# Patient Record
Sex: Female | Born: 1985 | Race: White | Hispanic: No | Marital: Married | Smoking: Never smoker
Health system: Southern US, Community
[De-identification: ages and names within clinical notes are randomized; demographics above are authoritative.]

## PROBLEM LIST (undated history)

## (undated) DIAGNOSIS — J302 Other seasonal allergic rhinitis: Secondary | ICD-10-CM

## (undated) DIAGNOSIS — F329 Major depressive disorder, single episode, unspecified: Secondary | ICD-10-CM

## (undated) DIAGNOSIS — F32A Depression, unspecified: Secondary | ICD-10-CM

## (undated) DIAGNOSIS — J45909 Unspecified asthma, uncomplicated: Secondary | ICD-10-CM

## (undated) DIAGNOSIS — B019 Varicella without complication: Secondary | ICD-10-CM

## (undated) HISTORY — DX: Major depressive disorder, single episode, unspecified: F32.9

## (undated) HISTORY — DX: Varicella without complication: B01.9

## (undated) HISTORY — DX: Depression, unspecified: F32.A

## (undated) HISTORY — DX: Other seasonal allergic rhinitis: J30.2

---

## 2009-06-19 ENCOUNTER — Emergency Department (HOSPITAL_COMMUNITY): Admission: EM | Admit: 2009-06-19 | Discharge: 2009-06-19 | Payer: Self-pay | Admitting: Emergency Medicine

## 2009-06-19 IMAGING — US US ABDOMEN COMPLETE
1 series · 14 of 25 positions shown · non-contrast
Comparison: None

CLINICAL DATA: Abdominal pain with vomiting

ABDOMEN ULTRASOUND
TECHNIQUE: Routine

[Series 1: us abdomen complete · 0.26mm/px · 14 of 74 slices shown]
[im 1/74]
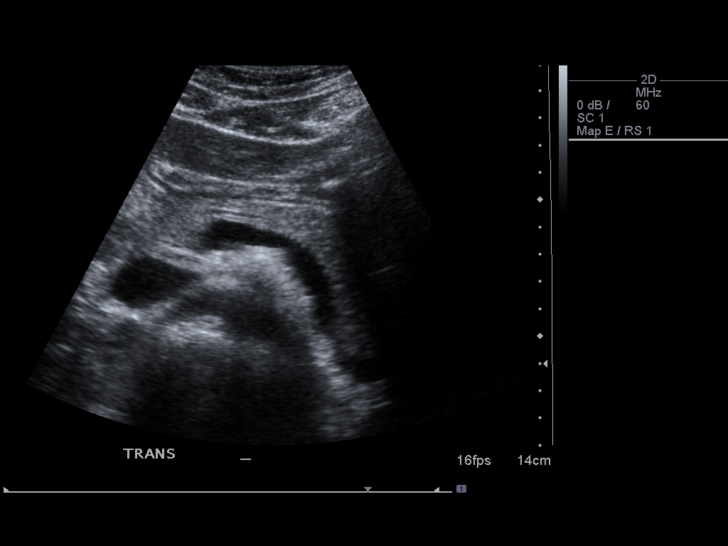
[im 7/74]
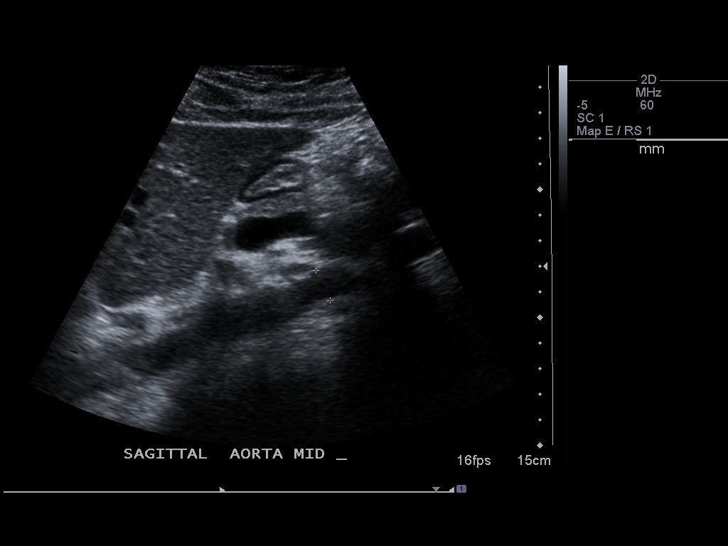
[im 13/74]
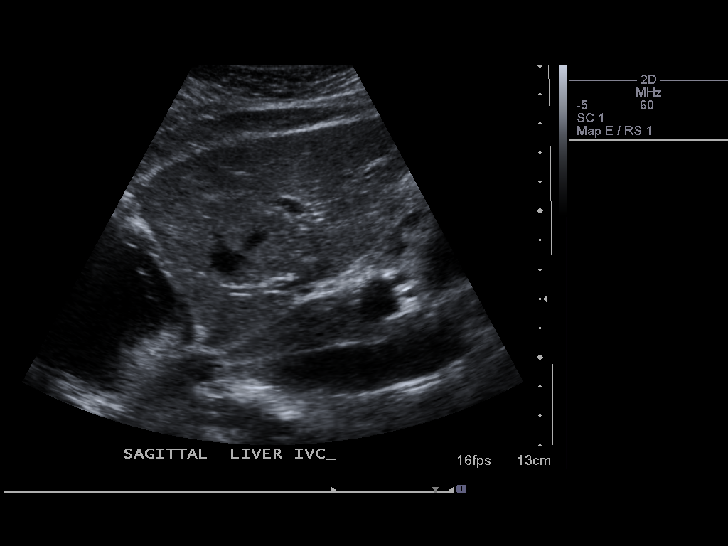
[im 19/74]
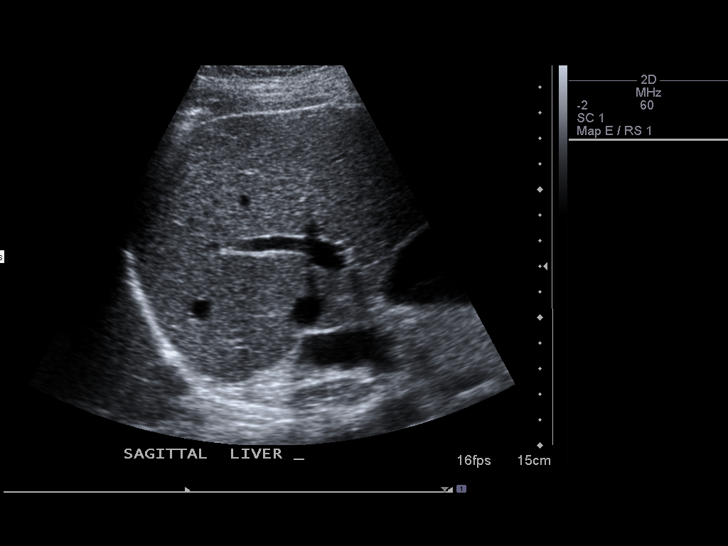
[im 25/74]
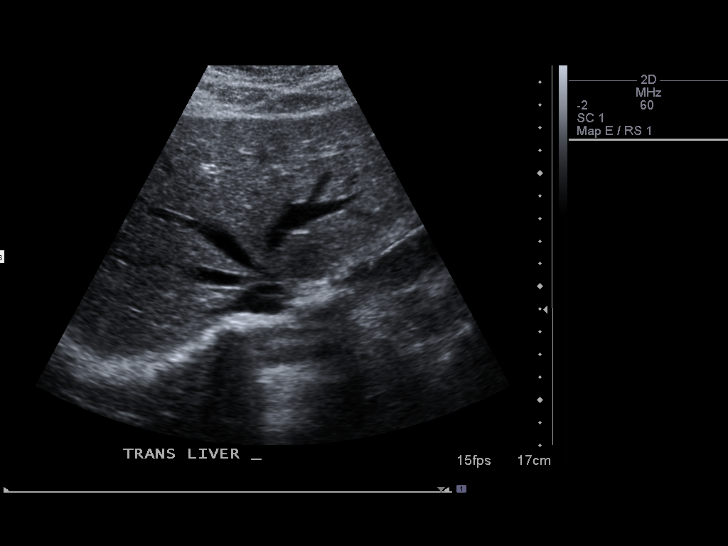
[im 28/74]
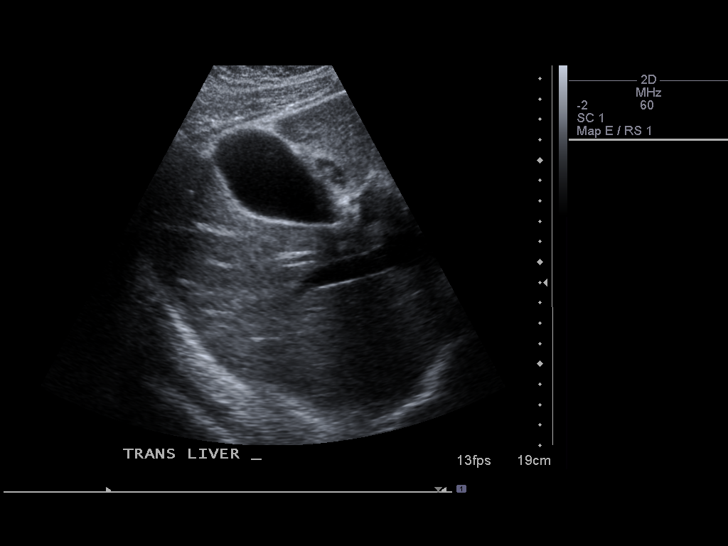
[im 34/74]
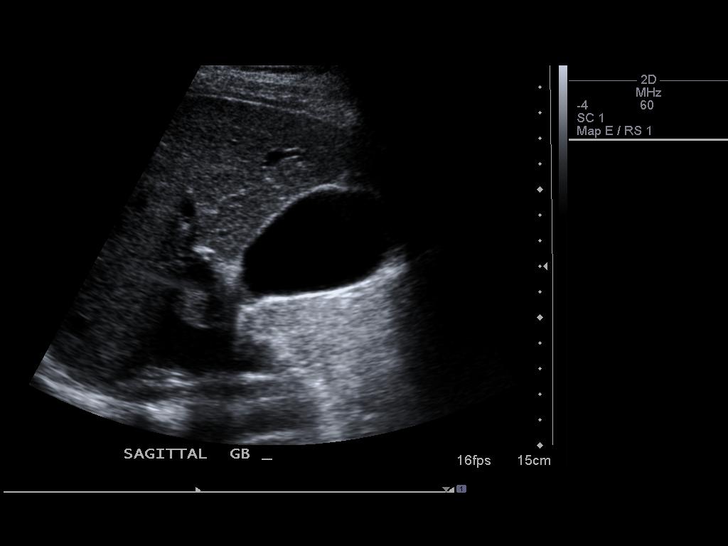
[im 40/74]
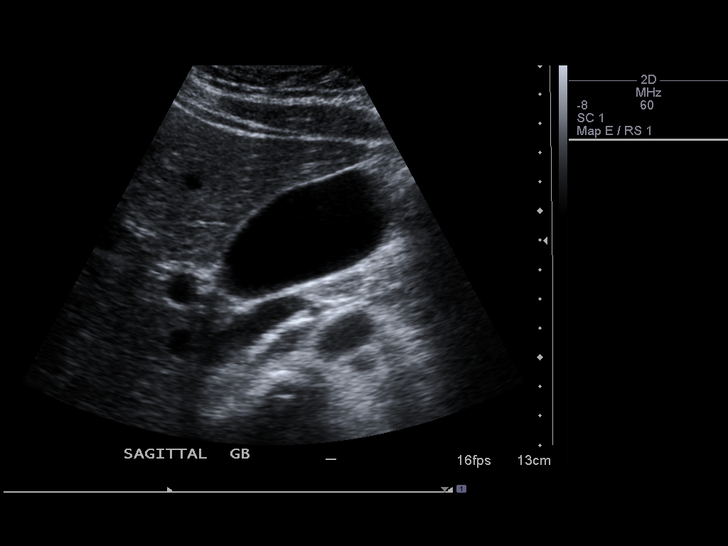
[im 46/74]
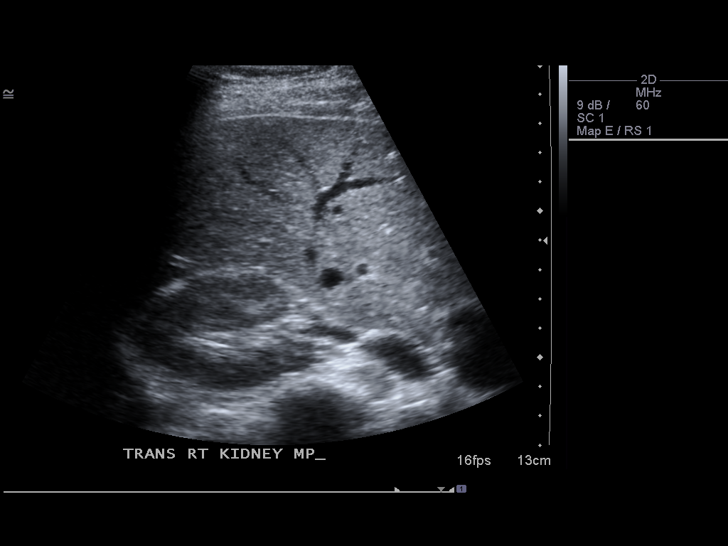
[im 49/74]
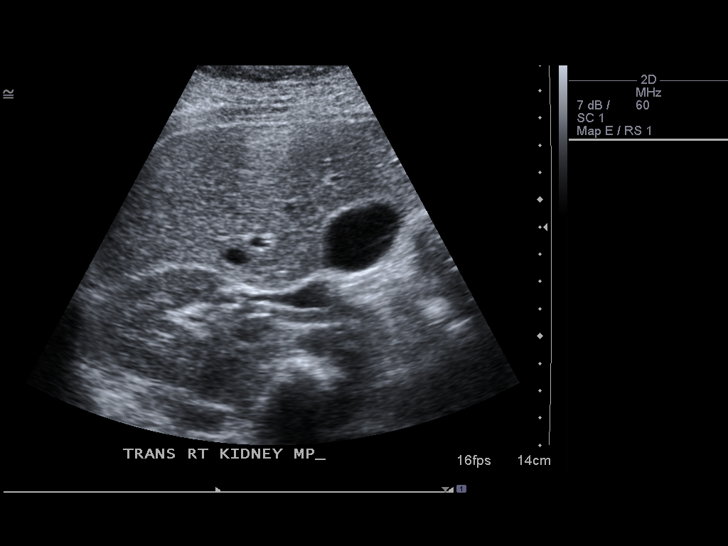
[im 55/74]
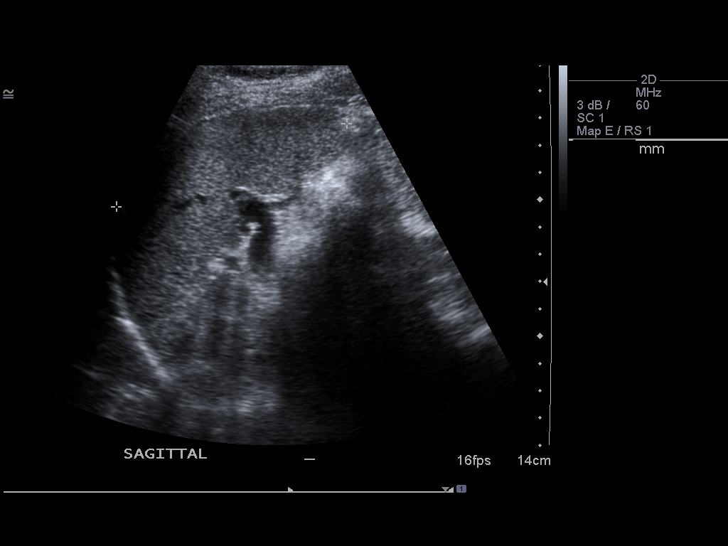
[im 61/74]
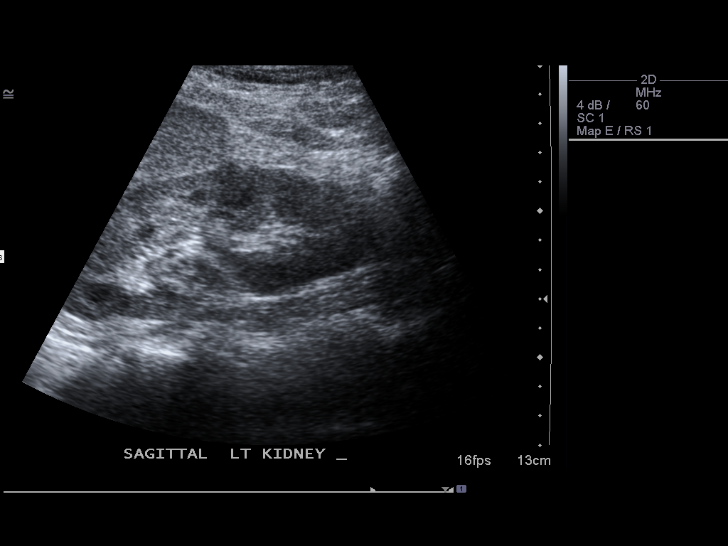
[im 67/74]
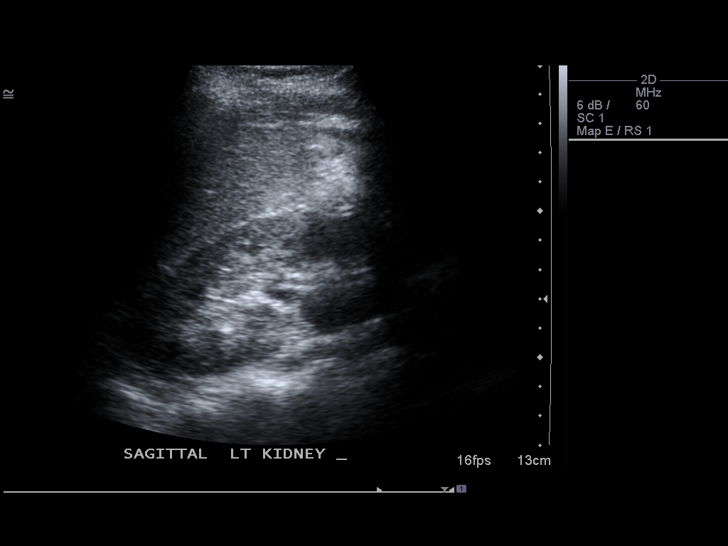
[im 74/74]
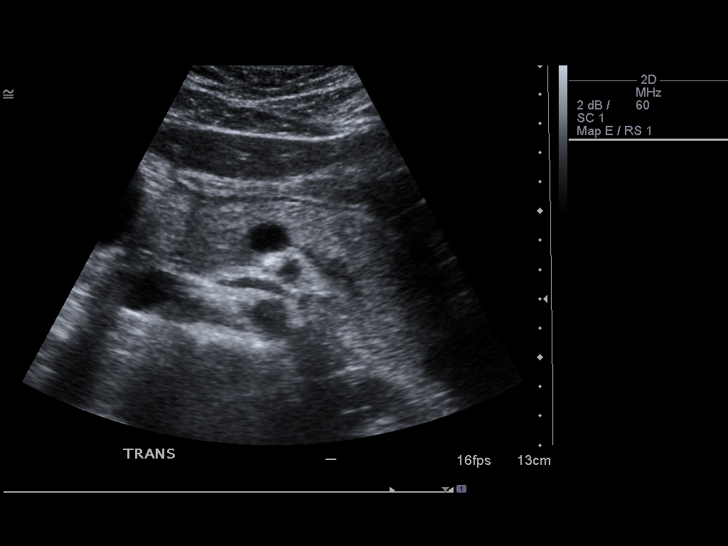

[14 of 25 positions shown; findings below may reference images not displayed]

FINDINGS: Gallbladder and bile ducts normal.  The common duct
measures 3.7 mm.

Liver, pancreas, spleen, and kidneys normal.  IVC and aorta normal.
No ascites.
IMPRESSION: No pathological findings.

## 2011-04-01 LAB — DIFFERENTIAL
Basophils Relative: 0 % (ref 0–1)
Eosinophils Absolute: 0.2 10*3/uL (ref 0.0–0.7)
Lymphs Abs: 1.1 10*3/uL (ref 0.7–4.0)
Monocytes Absolute: 0.3 10*3/uL (ref 0.1–1.0)
Monocytes Relative: 3 % (ref 3–12)
Neutro Abs: 8.5 10*3/uL — ABNORMAL HIGH (ref 1.7–7.7)

## 2011-04-01 LAB — URINALYSIS, ROUTINE W REFLEX MICROSCOPIC
Glucose, UA: NEGATIVE mg/dL
Hgb urine dipstick: NEGATIVE
Protein, ur: NEGATIVE mg/dL
pH: 7 (ref 5.0–8.0)

## 2011-04-01 LAB — COMPREHENSIVE METABOLIC PANEL
ALT: 22 U/L (ref 0–35)
Albumin: 3.8 g/dL (ref 3.5–5.2)
Alkaline Phosphatase: 71 U/L (ref 39–117)
Calcium: 9.2 mg/dL (ref 8.4–10.5)
Potassium: 3.4 mEq/L — ABNORMAL LOW (ref 3.5–5.1)
Sodium: 139 mEq/L (ref 135–145)
Total Protein: 7.4 g/dL (ref 6.0–8.3)

## 2011-04-01 LAB — CBC
MCHC: 33.8 g/dL (ref 30.0–36.0)
Platelets: 266 10*3/uL (ref 150–400)
RDW: 13.9 % (ref 11.5–15.5)

## 2011-04-01 LAB — POCT PREGNANCY, URINE: Preg Test, Ur: NEGATIVE

## 2012-12-07 ENCOUNTER — Ambulatory Visit: Payer: Self-pay | Admitting: Family Medicine

## 2012-12-11 ENCOUNTER — Encounter: Payer: Self-pay | Admitting: Family Medicine

## 2012-12-11 ENCOUNTER — Ambulatory Visit (INDEPENDENT_AMBULATORY_CARE_PROVIDER_SITE_OTHER): Payer: BC Managed Care – PPO | Admitting: Family Medicine

## 2012-12-11 VITALS — BP 112/78 | HR 111 | Temp 98.7°F | Ht 65.0 in | Wt 185.0 lb

## 2012-12-11 DIAGNOSIS — Z7189 Other specified counseling: Secondary | ICD-10-CM

## 2012-12-11 DIAGNOSIS — Z23 Encounter for immunization: Secondary | ICD-10-CM

## 2012-12-11 DIAGNOSIS — E669 Obesity, unspecified: Secondary | ICD-10-CM

## 2012-12-11 DIAGNOSIS — Z7689 Persons encountering health services in other specified circumstances: Secondary | ICD-10-CM

## 2012-12-11 LAB — TSH: TSH: 2.3 u[IU]/mL (ref 0.35–5.50)

## 2012-12-11 LAB — LIPID PANEL
Cholesterol: 252 mg/dL — ABNORMAL HIGH (ref 0–200)
Total CHOL/HDL Ratio: 3
Triglycerides: 101 mg/dL (ref 0.0–149.0)

## 2012-12-11 NOTE — Patient Instructions (Addendum)
-  We have ordered labs or studies at this visit. It can take up to 1-2 weeks for results and processing. We will contact you with instructions IF your results are abnormal. Normal results will be released to your MYCHART. If you have not heard from us or can not find your results in MYCHART in 2 weeks please contact our office.  -PLEASE SIGN UP FOR MYCHART TODAY   We recommend the following healthy lifestyle measures: - eat a healthy diet consisting of lots of vegetables, fruits, beans, nuts, seeds, healthy meats such as white chicken and fish and whole grains.  - avoid fried foods, fast food, processed foods, sodas, red meet and other fattening foods.  - get a least 150 minutes of aerobic exercise per week.   Follow up in: 1 year  

## 2012-12-11 NOTE — Addendum Note (Signed)
Addended by: Azucena Freed on: 12/11/2012 03:42 PM   Modules accepted: Orders

## 2012-12-11 NOTE — Progress Notes (Signed)
Chief Complaint  Patient presents with  . Establish Care    HPI:  Jean Woods is here to establish care. Wanted to have PCP. Last PCP and physical: March with gyn Work: [employer] Home Situation: lives with fiance Spiritual Beliefs: jewish Lifestyle: no regular exercise - walks 2-3 days a week - diet is pretty  Has the following chronic problems and concerns today: Wants to get basic labs today - worried about hypothyroidism as runs in her family Denies: CP, SOB, fevers, heat/cold intolerance Wants to lose weight, dry skin sometimes in the winter  Allergies: -sees allergist -takes claritin and zytec, nasonex and eye drops -getting allergy shots -no allergy   Other Providers: -[facility] - last physical with pap in March 2013 -Allergist, Dr. Gary Woods   Health Maintenance: -refused flu -wants tdap  ROS: See pertinent positives and negatives per HPI.  Past Medical History  Diagnosis Date  . Chicken pox   . Depression   . Seasonal allergies     Family History  Problem Relation Age of Onset  . Colon cancer Maternal Grandfather   . Breast cancer Mother   . Stroke Maternal Grandmother   . Thyroid disease Maternal Grandmother   . Thyroid disease Mother     History   Social History  . Marital Status: Single    Spouse Name: N/A    Number of Children: N/A  . Years of Education: N/A   Social History Main Topics  . Smoking status: Never Smoker   . Smokeless tobacco: None  . Alcohol Use: Yes     Comment: under safe drinking limits  . Drug Use: None  . Sexually Active: None   Other Topics Concern  . None   Social History Narrative  . None    Current outpatient prescriptions:cetirizine (ZYRTEC) 10 MG tablet, Take 10 mg by mouth daily., Disp: , Rfl: ;  CRYSELLE-28 0.3-30 MG-MCG tablet, Take 1 tablet by mouth daily. , Disp: , Rfl: ;  loratadine (CLARITIN) 10 MG tablet, Take 10 mg by mouth daily., Disp: , Rfl: ;  NASONEX 50 MCG/ACT  nasal spray, Place 2 sprays into the nose daily. , Disp: , Rfl: ;  Tetrahydrozoline HCl (EYE DROPS OP), Apply to eye., Disp: , Rfl:  EPIPEN 2-PAK 0.3 MG/0.3ML DEVI, 0.3 mg once. , Disp: , Rfl:   EXAM:  Filed Vitals:   12/11/12 0930  BP: 112/78  Pulse: 111  Temp: 98.7 F (37.1 C)    Body mass index is 30.79 kg/(m^2).  GENERAL: vitals reviewed and listed above, alert, oriented, appears well hydrated and in no acute distress  HEENT: atraumatic, conjunttiva clear, no obvious abnormalities on inspection of external nose and ears  NECK: no obvious masses on inspection  LUNGS: clear to auscultation bilaterally, no wheezes, rales or rhonchi, good air movement  CV: HRRR, no peripheral edema  MS: moves all extremities without noticeable abnormality  PSYCH: pleasant and cooperative, no obvious depression or anxiety  ASSESSMENT AND PLAN:  Discussed the following assessment and plan:  1. Encounter to establish care with new doctor  Lipid Panel, Hemoglobin A1c  2. Obesity  TSH   -We reviewed the PMH, PSH, FH, SH, Meds and Allergies. -We provided refills for any medications we will prescribe as needed. -We addressed current concerns per orders and patient instructions. -We have asked for records for pertinent exams, studies, vaccines and notes from previous providers. -We have advised patient to follow up per instructions below. -Influenza  vaccine refused, tdap given today  -Patient advised to return or notify a doctor immediately if symptoms worsen or persist or new concerns arise.  Patient Instructions  -We have ordered labs or studies at this visit. It can take up to 1-2 weeks for results and processing. We will contact you with instructions IF your results are abnormal. Normal results will be released to your [facility]. If you have not heard from [place] or can not find your results in [facility] in 2 weeks please contact our office.  -PLEASE SIGN UP FOR MYCHART TODAY   We recommend the  following healthy lifestyle measures: - eat a healthy diet consisting of lots of vegetables, fruits, beans, nuts, seeds, healthy meats such as white chicken and fish and whole grains.  - avoid fried foods, fast food, processed foods, sodas, red meet and other fattening foods.  - get a least 150 minutes of aerobic exercise per week.   Follow up in: 1 year      Delila Kuklinski, Damita Lack.

## 2012-12-14 ENCOUNTER — Telehealth: Payer: Self-pay | Admitting: Family Medicine

## 2012-12-14 DIAGNOSIS — R635 Abnormal weight gain: Secondary | ICD-10-CM

## 2012-12-14 NOTE — Telephone Encounter (Signed)
Please let patient know,   -cholesterol is a high - but good cholesterol also high -The best treatment to hopefully reverse this finding and prevent adverse health outcomes is a healthy diet and regular exercise.  Schedule follow up in about 3-4 months if not already scheduled to do so.  -other labs looked good

## 2012-12-15 NOTE — Telephone Encounter (Signed)
I am new to area and don't really have specific recs. Can place referral if she would like.

## 2012-12-15 NOTE — Telephone Encounter (Signed)
Left a message for pt to return call 

## 2012-12-15 NOTE — Telephone Encounter (Signed)
Called and spoke with pt and pt is aware.  

## 2012-12-15 NOTE — Telephone Encounter (Signed)
Pt would like information about nutritionist in the area. Can you do a referral?

## 2012-12-18 NOTE — Addendum Note (Signed)
Addended by: Kern Reap B on: 12/18/2012 08:17 AM   Modules accepted: Orders

## 2012-12-25 ENCOUNTER — Encounter: Payer: Self-pay | Admitting: Dietician

## 2012-12-25 ENCOUNTER — Encounter: Payer: BC Managed Care – PPO | Attending: Family Medicine | Admitting: Dietician

## 2012-12-25 VITALS — Ht 65.0 in | Wt 194.9 lb

## 2012-12-25 DIAGNOSIS — E7800 Pure hypercholesterolemia, unspecified: Secondary | ICD-10-CM

## 2012-12-25 DIAGNOSIS — Z713 Dietary counseling and surveillance: Secondary | ICD-10-CM | POA: Insufficient documentation

## 2012-12-25 DIAGNOSIS — R635 Abnormal weight gain: Secondary | ICD-10-CM | POA: Insufficient documentation

## 2012-12-25 DIAGNOSIS — E663 Overweight: Secondary | ICD-10-CM | POA: Insufficient documentation

## 2012-12-25 NOTE — Progress Notes (Signed)
  Medical Nutrition Therapy:  Appt start time: 0930 end time:  1030.  Assessment:  Primary concerns today: wt loss, cholesterol.   Pt states had gained 10-15 lbs. During 2 weeks of taking a steroid. Reports slow wt gain for about 7 months. Previous normal wt of 180 lbs. Pt states goal to drop below previous weight to a more healthful body weight.  MEDICATIONS: see list.   DIETARY INTAKE:  Usual eating pattern includes 3 meals and 1 snacks per day.  Everyday foods include red meat, cereal, milk.  Avoided foods include pork (except bacon), mayo, mustard.    24-hr recall:  B ( AM): frosted cheerios with milk. Coffee with artificial sweetener and milk. (1% milk)  Snk ( AM): water, sometimes a piece of fruit L ( PM): salads (vinagrette with grilled chix or shrimp, tomato, hard boiled egg, mixed greens); sandwich from [employer] (cheese, [place]) Snk ( PM): none D ( PM): steak or pasta with vegetables (baked or steamed) and a starch: mashed potatoes, bread (garlic bread or rolls). Adds cheese to pasta.  Snk ( PM): pretzels, sometimes a piece of fruit Beverages: coffee, water, dr. Reino Kent 10 on occasion, rare juice.  States trying to cut back on sugar of late. States high intake of pasta (white) with red sauce; makes her own pasta. Lots of red meat- steak (beef, no pork).   Usual physical activity: tried to begin running again, but is hindered by feelings of allergic reaction during deep breathing. Hike 3 miles a week, walks dog once per day for about 0.25 miles.   Progress Towards Goal(s):  In progress.   Nutritional Diagnosis:  NB-1.1 Food and nutrition-related knowledge deficit As related to portion sizes, calorie density of foods, activity.  As evidenced by pt reported lack of knowledge about what foods influence cholesterol, large portions of meats and cheeses, and low PA.    Intervention:  Nutrition counseling provided regarding portion control, especially of meats and cheeses,  starches. Counseling provided regarding increasing Physical Activity. Also advised patient to use plate method to increase vegetable consumption. Pt advised to increase veg consumption, eat minimum one fruit per day, reduce animal fat intake, and increase PA to minimum 150 minutes per week to reduce LDL.   RD discussed with pt calorie and protein goals- 1500-1800 kcal diet with minimum 100 grams protein. Pt advised to increase vegetable consumption, limit starches and fats. Pt also advised to increase PA to 240-400 minutes per week for wt loss purposes. Pt requested strengthening exercise information, and will increase hiking and walking for PA.   Handouts given during visit include:  Home workout program  Portion size card  Monitoring/Evaluation:  Dietary intake, exercise, portion control, and body weight in 1 month(s).

## 2013-01-25 ENCOUNTER — Encounter: Payer: BC Managed Care – PPO | Attending: Family Medicine | Admitting: Dietician

## 2013-01-25 ENCOUNTER — Encounter: Payer: Self-pay | Admitting: Dietician

## 2013-01-25 VITALS — Ht 66.0 in | Wt 195.1 lb

## 2013-01-25 DIAGNOSIS — R635 Abnormal weight gain: Secondary | ICD-10-CM

## 2013-01-25 DIAGNOSIS — E663 Overweight: Secondary | ICD-10-CM | POA: Insufficient documentation

## 2013-01-25 DIAGNOSIS — Z713 Dietary counseling and surveillance: Secondary | ICD-10-CM | POA: Insufficient documentation

## 2013-01-25 DIAGNOSIS — E7800 Pure hypercholesterolemia, unspecified: Secondary | ICD-10-CM | POA: Insufficient documentation

## 2013-01-25 NOTE — Progress Notes (Signed)
A: 195.1#  Pt claims she has really altered diet, to include considerably more vegetables, limits portions of proteins to no more than  6 oz at a time. She has also limited starches quite a bit, much less pasta in particular.   Pt has been starting some strength training- about 2 weeks of push ups and sit ups. Goal going forward is 30 minutes of cardio and some more strength training with new [employer] She has just started late last week. She states her fiancee has helped her develop a workout program, and the two will be going to the [facility] together.  Diet Recall: Breakfast: coffee and cereal with milk Lunch: sandwich or vegetable stir fry with brown rice Snack: sometimes walnuts Dinner: roasted vegetables, cup of brown rice, 4-6 oz. Of meat.  Snack: fruit or nuts Beverages: water, milk  [place] Body Fat Scale indicates current body fat 42%.   I: Pt has made very many positive dietary changes, and is essentially at an ideal diet for her needs at this point. We discussed optimal portion control, and the key area of potential improvement is PA, where she is admittedly lacking.  Because the pt is now motivated to make the necessary PA changes, we discussed good goals for both total PA (150 minutes per week for health benefits, 420 minutes per week for wt loss effects), and body composition changes. Pt seems to enjoy strength training, so we discussed the potential for increases in LBM affecting wt loss. [place] body scan was performed and will be checked at next appointment.   M/E: Pt wanted to work on her own and check in again after a new blood draw with PCP to see progress toward changes in lipid profile. F/U at pt discretion, preferably with new blood lipid results.

## 2013-02-06 ENCOUNTER — Other Ambulatory Visit: Payer: Self-pay

## 2013-02-12 ENCOUNTER — Encounter: Payer: Self-pay | Admitting: Family Medicine

## 2013-02-12 ENCOUNTER — Telehealth: Payer: Self-pay | Admitting: Family Medicine

## 2013-02-12 ENCOUNTER — Ambulatory Visit (INDEPENDENT_AMBULATORY_CARE_PROVIDER_SITE_OTHER): Payer: BC Managed Care – PPO | Admitting: Family Medicine

## 2013-02-12 ENCOUNTER — Ambulatory Visit (INDEPENDENT_AMBULATORY_CARE_PROVIDER_SITE_OTHER)
Admission: RE | Admit: 2013-02-12 | Discharge: 2013-02-12 | Disposition: A | Discharge status: Home / Self Care | Payer: BC Managed Care – PPO | Source: Ambulatory Visit | Attending: Family Medicine | Admitting: Family Medicine

## 2013-02-12 VITALS — BP 132/80 | HR 99 | Temp 98.7°F | Wt 196.0 lb

## 2013-02-12 DIAGNOSIS — J3489 Other specified disorders of nose and nasal sinuses: Secondary | ICD-10-CM

## 2013-02-12 DIAGNOSIS — R062 Wheezing: Secondary | ICD-10-CM

## 2013-02-12 DIAGNOSIS — R0981 Nasal congestion: Secondary | ICD-10-CM

## 2013-02-12 DIAGNOSIS — R591 Generalized enlarged lymph nodes: Secondary | ICD-10-CM

## 2013-02-12 DIAGNOSIS — J309 Allergic rhinitis, unspecified: Secondary | ICD-10-CM

## 2013-02-12 DIAGNOSIS — R05 Cough: Secondary | ICD-10-CM

## 2013-02-12 DIAGNOSIS — R599 Enlarged lymph nodes, unspecified: Secondary | ICD-10-CM

## 2013-02-12 DIAGNOSIS — J329 Chronic sinusitis, unspecified: Secondary | ICD-10-CM

## 2013-02-12 LAB — COMPREHENSIVE METABOLIC PANEL
ALT: 25 U/L (ref 0–35)
CO2: 25 mEq/L (ref 19–32)
Calcium: 9.1 mg/dL (ref 8.4–10.5)
Chloride: 109 mEq/L (ref 96–112)
GFR: 94.19 mL/min (ref >60.00)
Sodium: 139 mEq/L (ref 135–145)
Total Bilirubin: 0.5 mg/dL (ref 0.3–1.2)
Total Protein: 7.2 g/dL (ref 6.0–8.3)

## 2013-02-12 LAB — CBC WITH DIFFERENTIAL/PLATELET
Basophils Absolute: 0.1 10*3/uL (ref 0.0–0.1)
Lymphocytes Relative: 35.6 % (ref 12.0–46.0)
Monocytes Relative: 7.4 % (ref 3.0–12.0)
Platelets: 329 10*3/uL (ref 150.0–400.0)
RDW: 14.8 % — ABNORMAL HIGH (ref 11.5–14.6)
WBC: 8.8 10*3/uL (ref 4.5–10.5)

## 2013-02-12 IMAGING — CR DG CHEST 2V
2 series · 2 of 2 positions shown · non-contrast
Comparison: None.

CLINICAL DATA: Cough and congestion

CHEST - 2 VIEW

[view not recorded (1 of 2)]
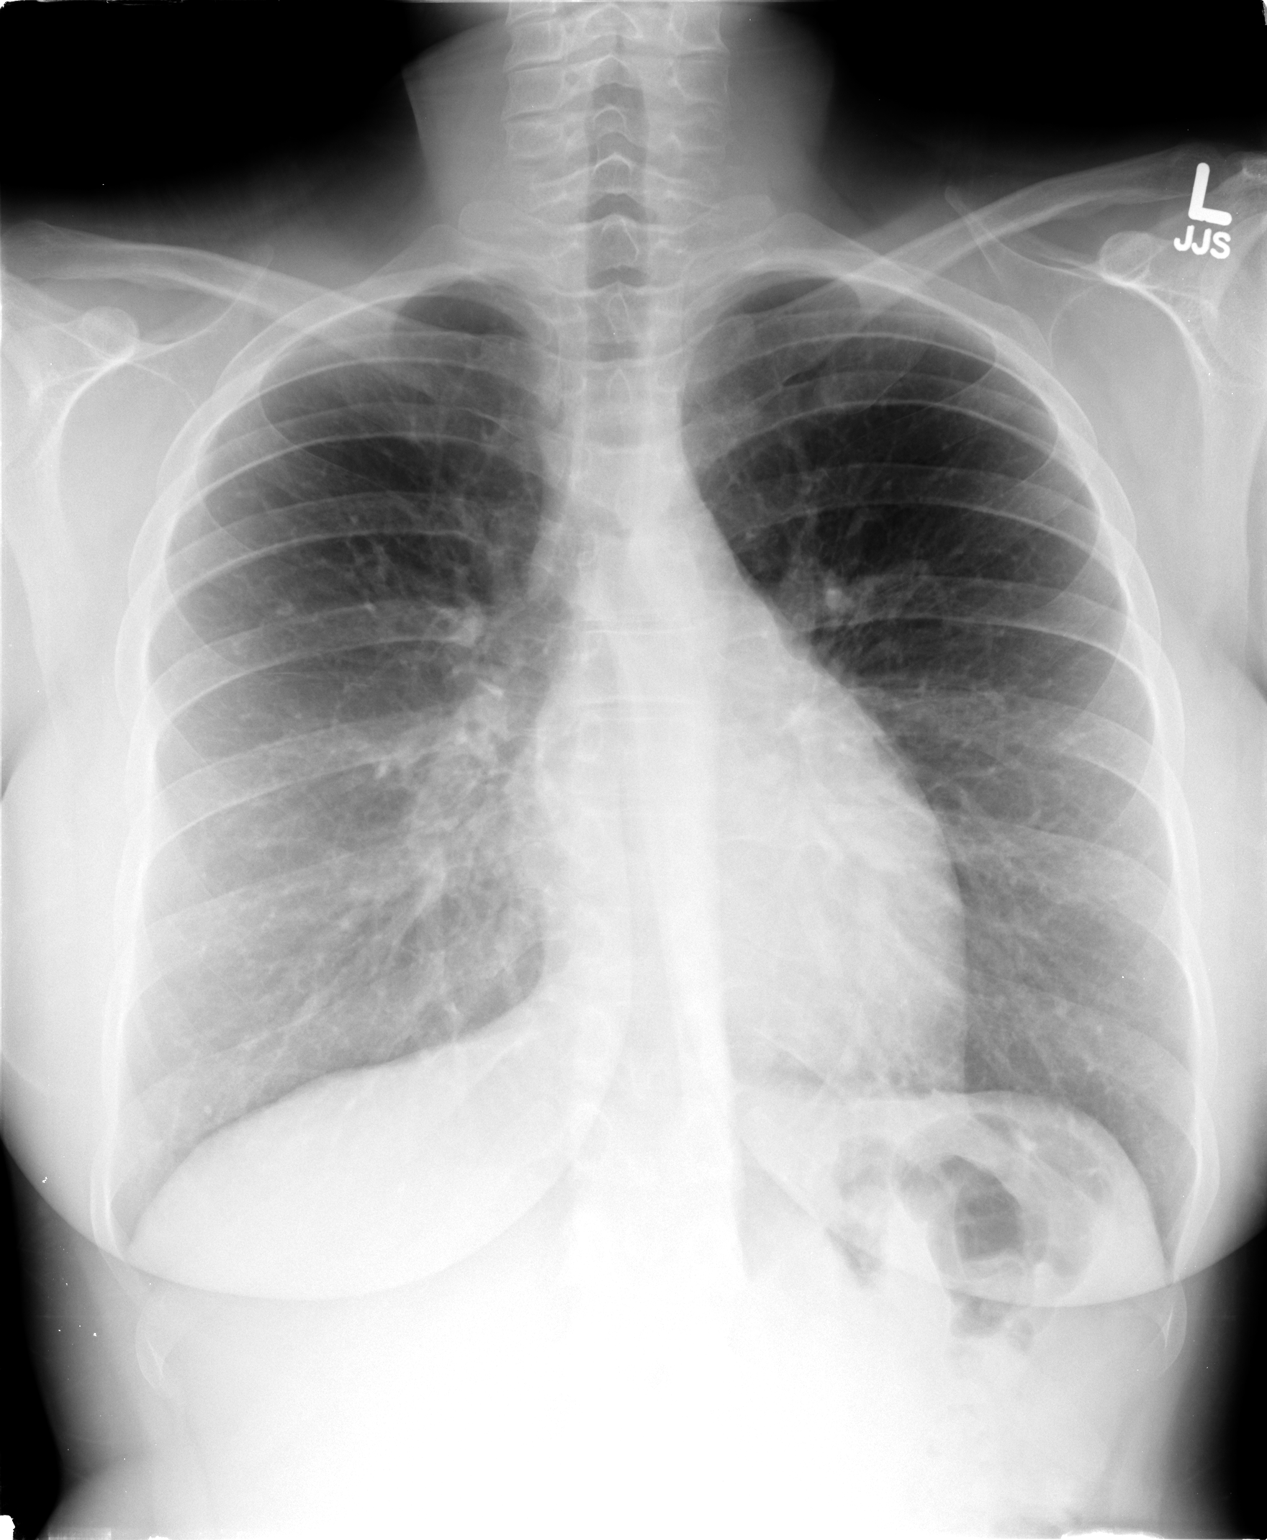

[view not recorded (2 of 2)]
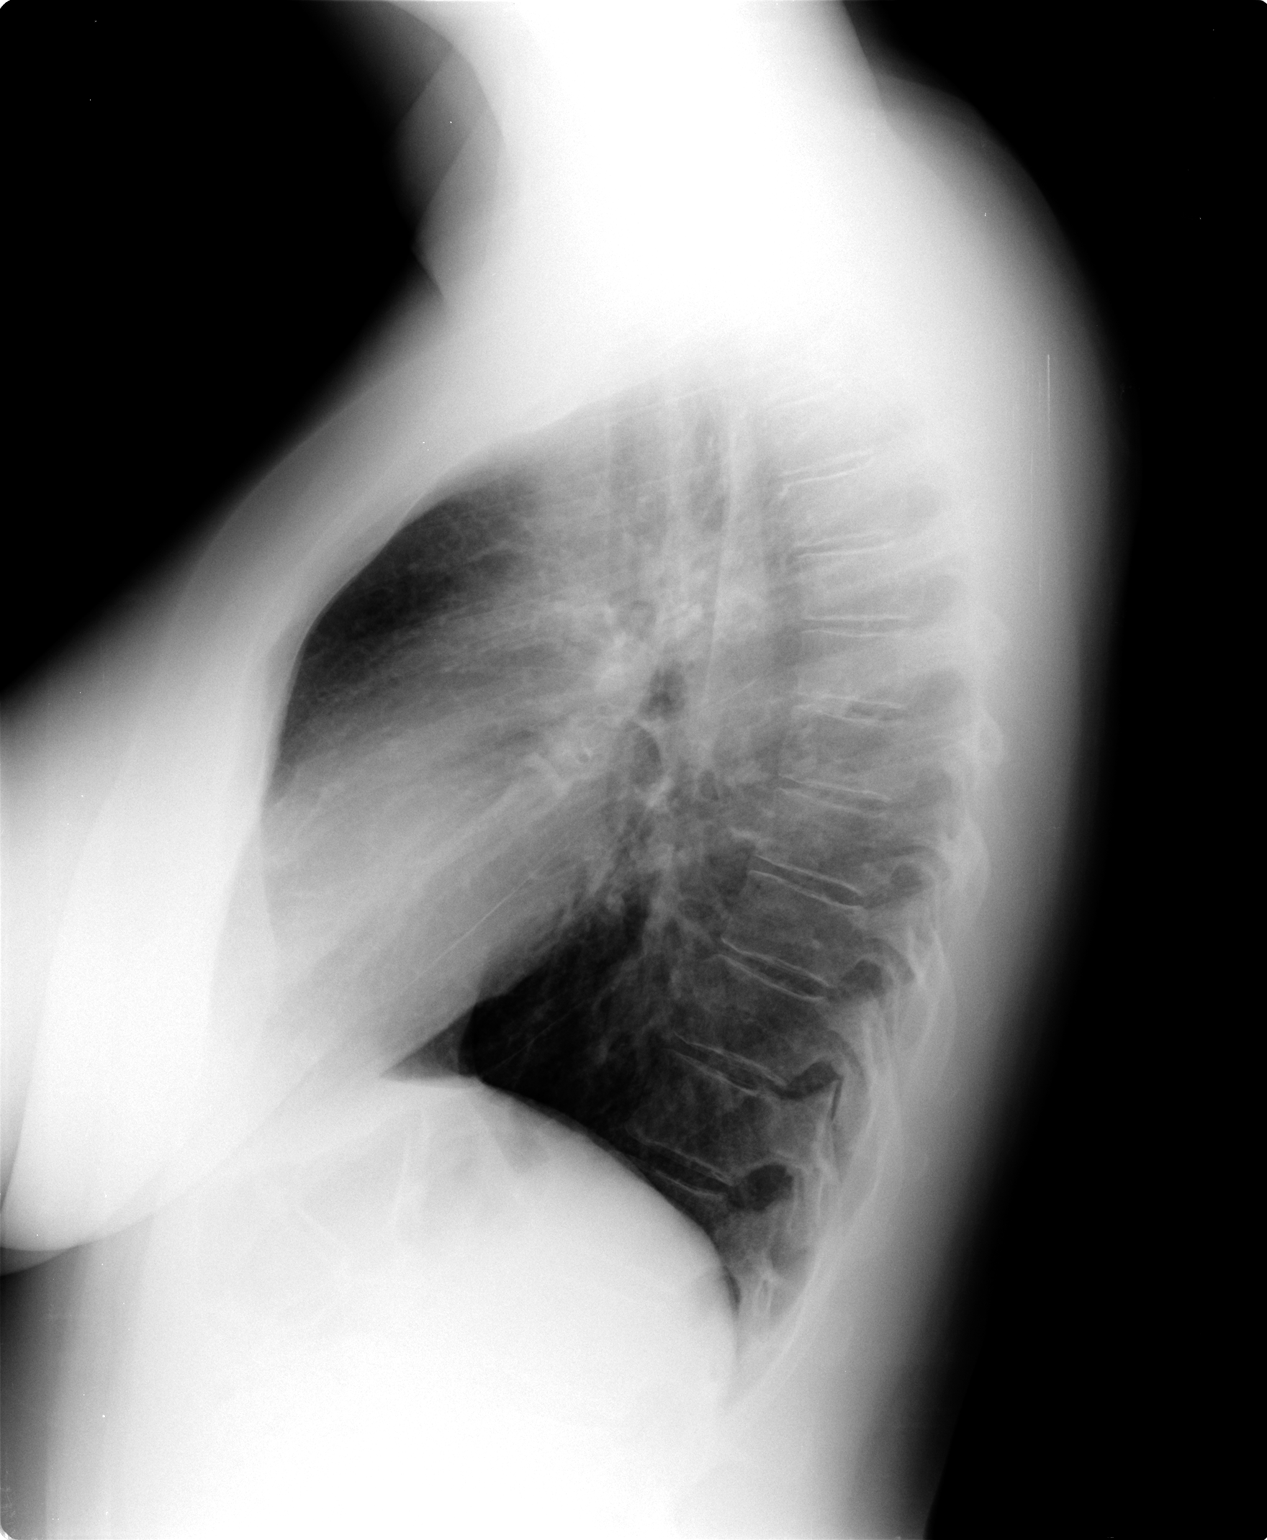

[2 of 2 positions shown; findings below may reference images not displayed]

FINDINGS: Heart size and vascularity are normal.  Lungs are clear
without evidence of pneumonia or effusion.  Negative for heart
failure.
IMPRESSION: Negative

## 2013-02-12 MED ORDER — FLUTICASONE PROPIONATE 50 MCG/ACT NA SUSP: 2.0000 | Freq: Every day | NASAL | Status: DC

## 2013-02-12 MED ORDER — LEVOFLOXACIN 500 MG PO TABS: 500.0000 mg | ORAL_TABLET | Freq: Every day | ORAL | Status: DC

## 2013-02-12 NOTE — Patient Instructions (Addendum)
-  continue zyrtec daily  -avoid exposure to cats and se dust mite covers on bed and pillows, wash sheets in hot water 1 time each week, vacuum weekly  -We have ordered labs or studies at this visit. It can take up to 1-2 weeks for results and processing. We will contact you with instructions IF your results are abnormal. Normal results will be released to your [facility]. If you have not heard from [place] or can not find your results in [facility] in 2 weeks please contact our office.  -We placed a referral for you as discussed to the Ear, Nose and Throat doctor. It usually takes about 1-2 weeks to process and schedule this referral. If you have not heard from [place] regarding this appointment in 2 weeks please contact our office.  -follow up in 1 month

## 2013-02-12 NOTE — Progress Notes (Signed)
Chief Complaint  Patient presents with  . Adenopathy    and eyes; couging, difficulty breathing     HPI:  Sick: -started: chronic since last summer - but worsening -has seen allergist and optho for this and tx with antibiotics and allergy shots, also has been treated with steroids twice -symptoms:nasal congestion, occ sinus pressure and pain, swelling in eyelids, coughing when exercises, wheezing, sore throat, cough, swollen lymph nodes -denies:fever, weight loss, NVD, tooth pain, strep or mono exposure -has tried:  Other then zytec -sick contacts: none -Hx of: AR - sees Dr. Gary Fleet - last allergy shot was last Monday, Allergic to cats and dust per testing - has 3 cats, only takes zyrtec, has a follow up with the Allergist in April   ROS: See pertinent positives and negatives per HPI.  Past Medical History  Diagnosis Date  . Chicken pox   . Depression   . Seasonal allergies     Family History  Problem Relation Age of Onset  . Colon cancer Maternal Grandfather   . Breast cancer Mother   . Stroke Maternal Grandmother   . Thyroid disease Maternal Grandmother   . Thyroid disease Mother     History   Social History  . Marital Status: Single    Spouse Name: N/A    Number of Children: N/A  . Years of Education: N/A   Social History Main Topics  . Smoking status: Never Smoker   . Smokeless tobacco: None  . Alcohol Use: Yes     Comment: under safe drinking limits  . Drug Use: None  . Sexually Active: None   Other Topics Concern  . None   Social History Narrative  . None    Current outpatient prescriptions:cetirizine (ZYRTEC) 10 MG tablet, Take 10 mg by mouth daily., Disp: , Rfl: ;  CRYSELLE-28 0.3-30 MG-MCG tablet, Take 1 tablet by mouth daily. , Disp: , Rfl: ;  EPIPEN 2-PAK 0.3 MG/0.3ML DEVI, 0.3 mg once. , Disp: , Rfl: ;  NASONEX 50 MCG/ACT nasal spray, Place 2 sprays into the nose daily. , Disp: , Rfl: ;  Tetrahydrozoline HCl (EYE DROPS OP), Apply to eye., Disp: ,  Rfl:  fluticasone (FLONASE) 50 MCG/ACT nasal spray, Place 2 sprays into the nose daily., Disp: 16 g, Rfl: 6;  levofloxacin (LEVAQUIN) 500 MG tablet, Take 1 tablet (500 mg total) by mouth daily., Disp: 7 tablet, Rfl: 0;  loratadine (CLARITIN) 10 MG tablet, Take 10 mg by mouth daily., Disp: , Rfl:   EXAM:  Filed Vitals:   02/12/13 0907  BP: 132/80  Pulse: 99  Temp: 98.7 F (37.1 C)    Body mass index is 31.65 kg/(m^2).  GENERAL: vitals reviewed and listed above, alert, oriented, appears well hydrated and in no acute distress  HEENT: atraumatic, conjunttiva clear, mild swelling and erythema of eyelids, no obvious abnormalities on inspection of external nose, ears with studs and appear mildy infected, normal appearance of ear canals and TMs, white nasal congestion, mild post oropharyngeal erythema with PND and cobblestonging, no tonsillar edema or exudate, no sinus TTP  NECK: bilateral submental LAD  LUNGS: clear to auscultation bilaterally, no wheezes, rales or rhonchi, good air movement  CV: HRRR, no peripheral edema  MS: moves all extremities without noticeable abnormality  PSYCH: pleasant and cooperative, no obvious depression or anxiety  ASSESSMENT AND PLAN:  Discussed the following assessment and plan:  Lymphadenopathy - Plan: CBC with Differential, CMP, HIV Antibody, Ambulatory referral to ENT  Chronic nasal congestion -  Plan: DG Chest 2 View  Wheezing  Chronic cough - Plan: Pulmonary function test  Allergic rhinitis - Plan: fluticasone (FLONASE) 50 MCG/ACT nasal spray  Sinusitis - Plan: levofloxacin (LEVAQUIN) 500 MG tablet   -multiple chronic complaints - nasal congestion, sore throat, cough, wheezing and swollen lymph nodes -has been tx with abx, prednisone and allergy shots but feels like nothing has worked -continues to live with 3 cats despite allergy to cats -she is worried about parasitic infections from the cats -advised staying away from cats, removing  studs in ears, flonase daily (has never used), zytec and will tx now with levoquin given exam findings in case sinusitis -will do labs and studies per orders (CBC, CMP, HIV, PFT, CXR) and refer to ENT for chronic sinusitis and LAD (will defer to ENT for ? Cat scratch fever and toxoplasmosis testing?), also advised her to follow up with allergist -will obtain allergist records -Patient advised to return or notify a doctor immediately if symptoms worsen or persist or new concerns arise.  Patient Instructions  -continue zyrtec daily  -avoid exposure to cats and se dust mite covers on bed and pillows, wash sheets in hot water 1 time each week, vacuum weekly  -We have ordered labs or studies at this visit. It can take up to 1-2 weeks for results and processing. We will contact you with instructions IF your results are abnormal. Normal results will be released to your [facility]. If you have not heard from [place] or can not find your results in [facility] in 2 weeks please contact our office.  -We placed a referral for you as discussed to the Ear, Nose and Throat doctor. It usually takes about 1-2 weeks to process and schedule this referral. If you have not heard from [place] regarding this appointment in 2 weeks please contact our office.  -follow up in 1 month           KIM, HANNAH R.

## 2013-02-14 ENCOUNTER — Telehealth: Payer: Self-pay | Admitting: Family Medicine

## 2013-02-14 NOTE — Telephone Encounter (Signed)
error 

## 2013-02-14 NOTE — Telephone Encounter (Signed)
All the labs we did look good and do not explain her symptoms. I will waiting for the notes from the ENT visit.

## 2013-02-15 NOTE — Telephone Encounter (Signed)
Left a detailed message for pt at cell phone number ( on DPR).

## 2013-02-23 ENCOUNTER — Ambulatory Visit (INDEPENDENT_AMBULATORY_CARE_PROVIDER_SITE_OTHER): Payer: BC Managed Care – PPO | Admitting: Internal Medicine

## 2013-02-23 DIAGNOSIS — R05 Cough: Secondary | ICD-10-CM

## 2013-02-23 LAB — PULMONARY FUNCTION TEST

## 2013-02-23 NOTE — Progress Notes (Signed)
PFT done today. Katie Welchel,CMA  

## 2013-02-25 ENCOUNTER — Telehealth: Payer: Self-pay | Admitting: Family Medicine

## 2013-02-25 NOTE — Telephone Encounter (Signed)
PFT with possible very mild obstructive defect - but not likely the cause of her symptoms. If she has continued problems with cough after evaluation with ENT would advise she see the pulmonologist.

## 2013-02-27 NOTE — Telephone Encounter (Signed)
Called and left a detailed message at pt's designated cell phone number.  

## 2013-03-03 ENCOUNTER — Encounter: Payer: Self-pay | Admitting: Family Medicine

## 2013-03-03 ENCOUNTER — Telehealth: Payer: Self-pay | Admitting: Internal Medicine

## 2013-03-03 NOTE — Telephone Encounter (Signed)
Pt had a PFT done here at our office but Dr. Selena Batten ordered the test. I advised her that since we were not the ones who ordered the test, we can't give her the results.  She will call Dr. Elmyra Ricks office to get the results.

## 2013-03-04 ENCOUNTER — Telehealth: Payer: Self-pay | Admitting: Family Medicine

## 2013-03-04 NOTE — Telephone Encounter (Signed)
error 

## 2013-03-12 ENCOUNTER — Encounter: Payer: Self-pay | Admitting: Family Medicine

## 2013-03-12 ENCOUNTER — Ambulatory Visit (INDEPENDENT_AMBULATORY_CARE_PROVIDER_SITE_OTHER): Payer: BC Managed Care – PPO | Admitting: Family Medicine

## 2013-03-12 VITALS — BP 120/82 | HR 87 | Temp 98.7°F | Wt 193.0 lb

## 2013-03-12 DIAGNOSIS — J3089 Other allergic rhinitis: Secondary | ICD-10-CM

## 2013-03-12 DIAGNOSIS — J3081 Allergic rhinitis due to animal (cat) (dog) hair and dander: Secondary | ICD-10-CM | POA: Insufficient documentation

## 2013-03-12 DIAGNOSIS — T887XXA Unspecified adverse effect of drug or medicament, initial encounter: Secondary | ICD-10-CM

## 2013-03-12 DIAGNOSIS — R059 Cough, unspecified: Secondary | ICD-10-CM | POA: Insufficient documentation

## 2013-03-12 DIAGNOSIS — T50905A Adverse effect of unspecified drugs, medicaments and biological substances, initial encounter: Secondary | ICD-10-CM | POA: Insufficient documentation

## 2013-03-12 DIAGNOSIS — R05 Cough: Secondary | ICD-10-CM

## 2013-03-12 NOTE — Progress Notes (Signed)
Chief Complaint  Patient presents with  . Follow-up    HPI:  Newer pt to me with multiple chronic complaints of cough, SOB, itchy eyes, nasal congestion, sinus issues, wheezing and cervical LAD -she reports she feels better after stopping antihistamines -DX and followed by allergist -allergic to cats but insists on living with 3 cats -last visit she was very concerned this is not allergies and was mostly concerned about the LAD -advised ENT evaluation and PFTs -PFTs with very mild obstructive defect without response to bronchodilator and bronchodilator made her heart race and made her feel bad -ENT visit: saw Dr. Pollyann Kennedy a few weeks ago - advised stopping antihistamine and following up with possible  ROS: See pertinent positives and negatives per HPI.  Past Medical History  Diagnosis Date  . Chicken pox   . Depression   . Seasonal allergies     Family History  Problem Relation Age of Onset  . Colon cancer Maternal Grandfather   . Breast cancer Mother   . Stroke Maternal Grandmother   . Thyroid disease Maternal Grandmother   . Thyroid disease Mother     History   Social History  . Marital Status: Single    Spouse Name: N/A    Number of Children: N/A  . Years of Education: N/A   Social History Main Topics  . Smoking status: Never Smoker   . Smokeless tobacco: None  . Alcohol Use: Yes     Comment: under safe drinking limits  . Drug Use: None  . Sexually Active: None   Other Topics Concern  . None   Social History Narrative  . None    Current outpatient prescriptions:CRYSELLE-28 0.3-30 MG-MCG tablet, Take 1 tablet by mouth daily. , Disp: , Rfl: ;  EPIPEN 2-PAK 0.3 MG/0.3ML DEVI, 0.3 mg once. , Disp: , Rfl: ;  NASONEX 50 MCG/ACT nasal spray, Place 2 sprays into the nose daily. , Disp: , Rfl: ;  loratadine (CLARITIN) 10 MG tablet, Take 10 mg by mouth daily., Disp: , Rfl: ;  Tetrahydrozoline HCl (EYE DROPS OP), Apply to eye., Disp: , Rfl:   EXAM:  Filed Vitals:   03/12/13 1010  BP: 120/82  Pulse: 87  Temp: 98.7 F (37.1 C)    Body mass index is 31.17 kg/(m^2).  GENERAL: vitals reviewed and listed above, alert, oriented, appears well hydrated and in no acute distress  HEENT: atraumatic, conjunttiva clear, no obvious abnormalities on inspection of external nose and ears, normal appearance of ear canals and TMs, clear nasal congestion, mild post oropharyngeal erythema with PND and coblestoning, no tonsillar edema or exudate, no sinus TTP  NECK: no obvious masses on inspection  LUNGS: clear to auscultation bilaterally, no wheezes, rales or rhonchi, good air movement  CV: HRRR, no peripheral edema  MS: moves all extremities without noticeable abnormality  PSYCH: pleasant and cooperative, no obvious depression or anxiety  ASSESSMENT AND PLAN:  Discussed the following assessment and plan:  Cat allergies  Cough  Medication reaction, initial encounter - to antihistamines, dry mouth and eyes, dizziness  -suspect some of her symptoms related to cat allergy and living in house with cats, she does not want to get rid of cat and reports she is feeling better and will follow up with ENT -for her coughing with exercise, query possible EIB, discussed tx - further workup options, she would like to maybe see pulm is symptoms persist and will call if wants this referral -Patient advised to return or notify a  doctor immediately if symptoms worsen or persist or new concerns arise.  There are no Patient Instructions on file for this visit.   Colin Benton R.

## 2013-03-22 ENCOUNTER — Telehealth: Payer: Self-pay

## 2013-03-22 NOTE — Telephone Encounter (Signed)
Per Dr. Riley Nearing that she is on my schedule Tuesday...but just saw her. Says 3 month follow up. I think this is a mistake? Can u check and remove from schedule if so?   Called pt and left a detailed message that pt was just seen on 3/21 and did not need to come in tomorrow unless there were new symptoms.  Appt was made in December of 2013.

## 2013-03-23 ENCOUNTER — Ambulatory Visit: Payer: BC Managed Care – PPO | Admitting: Family Medicine

## 2013-10-28 ENCOUNTER — Other Ambulatory Visit: Payer: Self-pay

## 2015-07-10 ENCOUNTER — Encounter (HOSPITAL_COMMUNITY): Payer: Self-pay | Admitting: Emergency Medicine

## 2015-07-10 ENCOUNTER — Emergency Department (HOSPITAL_COMMUNITY)
Admission: EM | Admit: 2015-07-10 | Discharge: 2015-07-10 | Disposition: A | Discharge status: Home / Self Care | Payer: BLUE CROSS/BLUE SHIELD | Attending: Emergency Medicine | Admitting: Emergency Medicine

## 2015-07-10 DIAGNOSIS — T7840XA Allergy, unspecified, initial encounter: Secondary | ICD-10-CM | POA: Diagnosis not present

## 2015-07-10 DIAGNOSIS — Y9289 Other specified places as the place of occurrence of the external cause: Secondary | ICD-10-CM | POA: Insufficient documentation

## 2015-07-10 DIAGNOSIS — Y999 Unspecified external cause status: Secondary | ICD-10-CM | POA: Diagnosis not present

## 2015-07-10 DIAGNOSIS — Z8619 Personal history of other infectious and parasitic diseases: Secondary | ICD-10-CM | POA: Insufficient documentation

## 2015-07-10 DIAGNOSIS — J45909 Unspecified asthma, uncomplicated: Secondary | ICD-10-CM | POA: Diagnosis not present

## 2015-07-10 DIAGNOSIS — Y9389 Activity, other specified: Secondary | ICD-10-CM | POA: Insufficient documentation

## 2015-07-10 DIAGNOSIS — X58XXXA Exposure to other specified factors, initial encounter: Secondary | ICD-10-CM | POA: Diagnosis not present

## 2015-07-10 DIAGNOSIS — Z8659 Personal history of other mental and behavioral disorders: Secondary | ICD-10-CM | POA: Insufficient documentation

## 2015-07-10 DIAGNOSIS — Z79899 Other long term (current) drug therapy: Secondary | ICD-10-CM | POA: Insufficient documentation

## 2015-07-10 HISTORY — DX: Unspecified asthma, uncomplicated: J45.909

## 2015-07-10 MED ORDER — FAMOTIDINE 20 MG PO TABS: 20.0000 mg | ORAL_TABLET | Freq: Two times a day (BID) | ORAL | Status: AC

## 2015-07-10 MED ORDER — FAMOTIDINE 20 MG PO TABS: 20.0000 mg | ORAL_TABLET | Freq: Two times a day (BID) | ORAL | Status: DC

## 2015-07-10 MED ORDER — DIPHENHYDRAMINE HCL 50 MG/ML IJ SOLN
50.0000 mg | Freq: Once | INTRAMUSCULAR | Status: AC
Start: 2015-07-10 — End: 2015-07-10
  Administered 2015-07-10: 50 mg via INTRAVENOUS
  Filled 2015-07-10: qty 1

## 2015-07-10 MED ORDER — LORAZEPAM 2 MG/ML IJ SOLN
0.5000 mg | Freq: Once | INTRAMUSCULAR | Status: AC
  Administered 2015-07-10: 0.5 mg via INTRAVENOUS
  Filled 2015-07-10: qty 1

## 2015-07-10 MED ORDER — METHYLPREDNISOLONE SODIUM SUCC 125 MG IJ SOLR
125.0000 mg | Freq: Once | INTRAMUSCULAR | Status: AC
  Administered 2015-07-10: 125 mg via INTRAVENOUS
  Filled 2015-07-10: qty 2

## 2015-07-10 MED ORDER — PREDNISONE 20 MG PO TABS: 40.0000 mg | ORAL_TABLET | Freq: Every day | ORAL | Status: DC

## 2015-07-10 MED ORDER — FAMOTIDINE IN NACL 20-0.9 MG/50ML-% IV SOLN
20.0000 mg | Freq: Once | INTRAVENOUS | Status: AC
  Administered 2015-07-10: 20 mg via INTRAVENOUS
  Filled 2015-07-10: qty 50

## 2015-07-10 MED ORDER — SODIUM CHLORIDE 0.9 % IV BOLUS (SEPSIS)
1000.0000 mL | Freq: Once | INTRAVENOUS | Status: AC
  Administered 2015-07-10: 1000 mL via INTRAVENOUS

## 2015-07-10 NOTE — ED Notes (Signed)
Pt c/o rash on arms and chest that started this morning. Pt is currently taking Benadryl, steroids since last week for some type of allergic reaction to insect bite.  Pt that she is itching every where.

## 2015-07-10 NOTE — Discharge Instructions (Signed)
You have been diagnosed with an allergic reaction. Usually allergic reactions like this are caused by exposures to something that you either ate or touched or smelled.  It may be related to a number of different exposures including a new perfume, topical creams, soaps, detergents, linens, clothing, medications. Occasionally we do not find an answer for why there is an allergic reaction. These are treated the same way including Benadryl as needed for itching and rash. (This can be used up to 50 mg every 6 hours as needed).  Pepcid 20 mg every night and prednisone once a day for 5 days. Please do not take the Benadryl and drive or take care of children or other imported duties as the Benadryl can make you sleepy.   ° °If you should develop severe or worsening symptoms including difficulty breathing, difficulty swallowing, wheezing or increased coughing or a rash that developed on the inside of your mouth or a worsening rash on your skin, return to the hospital immediately for a recheck. Please call your Dr. in the morning for a recheck in 2 days if you are still having symptoms. If you do not have a Dr. see the list below.  If we have identified the source of your allergic reaction, please avoid this at all costs. This means stopping the medication if it is a new medication or a voiding topical exposures such as creams lotions body soaps or deodorants if this is the source. ° °Allergic Reaction, Mild to Moderate °Allergies may happen from anything your body is sensitive to. This may be food, medications, pollens, chemicals, and nearly anything around you in everyday life that produces allergens. An allergen is anything that causes an allergy producing substance. Allergens cause your body to release allergic antibodies. Through a chain of events, they cause a release of histamine into the blood stream. Histamines are meant to protect you, but they also cause your discomfort. This is why antihistamines are often used  for allergies. Heredity is often a factor in causing allergic reactions. This means you may have some of the same allergies as your parents. °Allergies happen in all age groups. You may have some idea of what caused your reaction. There are many allergens around us. It may be difficult to know what caused your reaction. If this is a first time event, it may never happen again. Allergies cannot be cured but can be controlled with medications. °SYMPTOMS  °You may get some or all of the following problems from allergies. °· Swelling and itching in and around the mouth.  °· Tearing, itchy eyes.  °· Nasal congestion and runny nose.  °· Sneezing and coughing.  °· An itchy red rash or hives.  °· Vomiting or diarrhea.  °· Difficulty breathing.  °Seasonal allergies occur in all age groups. They are seasonal because they usually occur during the same season every year. They may be a reaction to molds, grass pollens, or tree pollens. Other causes of allergies are house dust mite allergens, pet dander and mold spores. These are just a common few of the thousands of allergens around us. All of the symptoms listed above happen when you come in contact with pollens and other allergens. Seasonal allergies are usually not life threatening. They are generally more of a nuisance that can often be handled using medications. °Hay fever is a combination of all or some of the above listed allergy problems. It may often be treated with simple over-the-counter medications such as diphenhydramine. Take medication as   directed. Check with your caregiver or package insert for child dosages. °TREATMENT AND HOME CARE INSTRUCTIONS °If hives or rash are present: °· Take medications as directed.  °· You may use an over-the-counter antihistamine (diphenhydramine) for hives and itching as needed. Do not drive or drink alcohol until medications used to treat the reaction have worn off. Antihistamines tend to make people sleepy.  °· Apply cold cloths  (compresses) to the skin or take baths in cool water. This will help itching. Avoid hot baths or showers. Heat will make a rash and itching worse.  °· If your allergies persist and become more severe, and over the counter medications are not effective, there are many new medications your caretaker can prescribe. Immunotherapy or desensitizing injections can be used if all else fails. Follow up with your caregiver if problems continue.  °SEEK MEDICAL CARE IF:  °· Your allergies are becoming progressively more troublesome.  °· You suspect a food allergy. Symptoms generally happen within 30 minutes of eating a food.  °· Your symptoms have not gone away within 2 days or are getting worse.  °· You develop new symptoms.  °· You want to retest yourself or your child with a food or drink you think causes an allergic reaction. Never test yourself or your child of a suspected allergy without being under the watchful eye of your caregivers. A second exposure to an allergen may be life-threatening.  °SEEK IMMEDIATE MEDICAL CARE IF: °· You develop difficulty breathing or wheezing, or have a tight feeling in your chest or throat.  °· You develop a swollen mouth, hives, swelling, or itching all over your body.  °A severe reaction with any of the above problems should be considered life-threatening. If you suddenly develop difficulty breathing call for local emergency medical help. THIS IS AN EMERGENCY. °MAKE SURE YOU:  °· Understand these instructions.  °· Will watch your condition.  °· Will get help right away if you are not doing well or get worse.  °Document Released: 10/06/2007 Document Revised: 11/28/2011 Document Reviewed: 10/06/2007 °ExitCare® Patient Information ©2012 [employer]. ° °RESOURCE GUIDE ° °Chronic Pain Problems: °Contact [facility]  [phone] °Patients need to be referred by their primary care doctor. ° °Insufficient Money for Medicine: °Contact United Way:  call "211" or [facility] 271-5999. ° °No Primary Care Doctor: °- Call [facility]  832-8000 - can help you locate a primary care doctor that  accepts your insurance, provides certain services, etc. °- Physician Referral Service- 1-800-533-3463 ° °Agencies that provide inexpensive medical care: °- [facility]  [phone] °- [facility]  [phone] °- [facility]  [phone] °- [facility]  832-4777 °- [facility]  [phone] °- [facility]  [phone] ° °Medicaid-accepting [place] Providers: °- [facility]- [address] ° [phone], Mon-Fri 9am-7pm, Sat 9am-1pm °- [facility]- [address], [address] ° [phone] °- [facility]- [address], [address] ° [phone] °- [facility]- [address] [place] [address] ° [phone] °- [facility]- [address], [address], [phone] ° Only accepts [place] Access Medicaid patients after they have their name  applied to their card ° °Self Pay (no insurance) in [place]: °- Sickle Cell Patients: Dr Eric Dean, [facility] ° [address], [phone] °- [facility]- [address] °   [phone] °      -     [facility]- [address] °      -     [facility]- see information above (Speak to Pam H if you do not have insurance) °      -  [facility]- [address], [phone] °      -  [facility] [place]- [address],  [phone] °      -  [facility]- [address] [place] [address], [phone] °      -  Dr Osei-Bonsu-  [address], [place], [phone] °      -  [facility]- [address], [phone] °      -  [facility]- [address] [place] [address], [phone], also [address], [phone] °      -    [facility]- [address], [phone], 1st & 3rd Saturday   every month,  10am-1pm ° °1) Find a Doctor and Pay Out of Pocket °Although you won't have to find out who is covered by your insurance plan, it is a good idea to ask around and get recommendations. You will then need to call the office and see if the doctor you have chosen will accept you as a new patient and what types of options they offer for patients who are self-pay. Some doctors offer discounts or will set up payment plans for their patients who do not have insurance, but you will need to ask so you aren't surprised when you get to your appointment. ° °2) Contact Your Local Health Department °Not all health departments have doctors that can see patients for sick visits, but many do, so it is worth a call to see if yours does. If you don't know where your local health department is, you can check in your phone book. The CDC also has a tool to help you locate your state's health department, and many state websites also have listings of all of their local health departments. ° °3) Find a Walk-in Clinic °If your illness is not likely to be very severe or complicated, you may want to try a walk in clinic. These are popping up all over the country in pharmacies, drugstores, and shopping centers. They're usually staffed by nurse practitioners or physician assistants that have been trained to treat common illnesses and complaints. They're usually fairly quick and inexpensive. However, if you have serious medical issues or chronic medical problems, these are probably not your best option ° °STD Testing °- [place] [place], [facility], [address], [place], phone [phone] or 1-877-539-9860.  Monday - Friday, call for an appointment. °- [place] [place], [facility], [address], [place], phone [phone] or 1-877-539-9860.  Monday - Friday, call for an appointment. ° °Abuse/Neglect: °- [place] Child Abuse Hotline (336)  641-3795 °- [place] Child Abuse Hotline 800-378-5315 (After Hours) ° °Emergency Shelter:  [facility] (336) 271-5985 ° °Maternity Homes: °- [facility] (336) 275-9566 °- [facility] (704) 372-4663 ° °MRSA Hotline #:   832-7006 ° °[place] Resources ° °[facility] [place]  [employer] [place] [facility] °[address]                   [address]         [address]  °[place]                                               [place]                              [place] °Phone:  349-3220                                  Phone:  [phone]                   Phone:  342-8140 ° °[facility], 342-8316 °- [place] [facility] - [facility]- 1-888-581-9988 °      -     [facility] in [place], [address],                                  336-349-4454, Insurance ° °[place] Child Abuse Hotline °(336) 342-1394 or (336) 342-3537 (After Hours) ° ° °Behavioral Health Services ° °Substance Abuse Resources: °- [facility]  336-882-2125 °- [facility] 336-784-9470 °- [facility] 336-285-9073 °- [facility] 336-845-3988 °- Residential & Outpatient Substance Abuse Program  800-659-3381 ° °Psychological Services: °- [facility]  832-9600 °- [facility]  378-7881 °- [facility], [address], [place], ACCESS LINE: 1-800-853-5163 or 336-641-4981, Http://www.[facility]/services/adult.htm ° °Dental Assistance ° °If unable to pay or uninsured, contact:  [facility] or [facility] to become qualified for the adult dental clinic. ° °Patients with Medicaid: [facility] °[address], [phone] °[address], [phone] ° °If unable to pay, or uninsured, contact [facility] (271-5999) or [place] [facility] (641-3152 in [place], [phone] in [place]) to become qualified for the adult dental clinic ° °Other Low-Cost Community Dental Services: °- [facility]- [address], [place], [place], [address], 723-1848, [address]. [address], 2nd and 4th Thursday of the month at 6:30am.  10 clients each day by appointment, can sometimes see walk-in patients if someone does not show for an appointment. °- [facility]- [address], [place], [place], [address], 723-7904 °- [facility]- [address], [place], [place], [address], [phone] °- [facility]- 342-8273 °- [facility]- [phone] °- [facility]- 570-6415 ° ° ° ° ° °

## 2015-07-10 NOTE — ED Provider Notes (Signed)
CSN: [id number]     Arrival date & time 07/10/15  1252 History   This chart was scribed for Delos Haring, PA-C working with Ripley Fraise, MD by Mercy Moore, ED Scribe. This patient was seen in room WTR7/WTR7 and the patient's care was started at 2:39 PM.   Chief Complaint  Patient presents with  . Urticaria   The history is provided by the patient. No language interpreter was used.   HPI Comments: Jean Woods is a 29 y.o. female who presents to the Emergency Department complaining of pruritic rash to chest and arms with reoccuring onset this morning. Patient reports rash and allergic reaction after performing yardwork six days ago. Patient evaluated at Urgent Care; treated with cortisone injection, and discharged with six day course of Prednisone and Benadryl. Patient reports development of rash to chest and arms this morning just after wakening almost eight hours, dry/tight throat, and nausea. She is tachycardic and hypertensive in triage. Patient reports taking a dose of Prednisone and Benadryl before work, but denies any improvement throughout the day. Patient reports history reaction to insect bites and seasonal allergies, but denies similar rash or reaction. Patient denies lip or tongue swelling or difficulty breathing.   Past Medical History  Diagnosis Date  . Chicken pox   . Depression   . Seasonal allergies   . Asthma    History reviewed. No pertinent past surgical history. Family History  Problem Relation Age of Onset  . Colon cancer Maternal Grandfather   . Breast cancer Mother   . Stroke Maternal Grandmother   . Thyroid disease Maternal Grandmother   . Thyroid disease Mother    History  Substance Use Topics  . Smoking status: Never Smoker   . Smokeless tobacco: Not on file  . Alcohol Use: Yes   OB History    No data available     Review of Systems  Constitutional: Negative for fever.  HENT: Negative for trouble swallowing.   Respiratory: Negative for  shortness of breath.   Gastrointestinal: Positive for nausea. Negative for vomiting.  Skin: Positive for rash.   Allergies  Benadryl  Home Medications   Prior to Admission medications   Medication Sig Start Date End Date Taking? Authorizing Provider  CRYSELLE-28 0.3-30 MG-MCG tablet Take 1 tablet by mouth daily.  11/29/12   Historical Provider, MD  EPIPEN 2-PAK 0.3 MG/0.3ML DEVI 0.3 mg once.  10/28/12   Historical Provider, MD  loratadine (CLARITIN) 10 MG tablet Take 10 mg by mouth daily.    Historical Provider, MD  NASONEX 50 MCG/ACT nasal spray Place 2 sprays into the nose daily.  10/28/12   Historical Provider, MD  Tetrahydrozoline HCl (EYE DROPS OP) Apply to eye.    Historical Provider, MD   Triage Vitals: BP 178/100 mmHg  Pulse 120  Temp(Src) 98 F (36.7 C) (Oral)  Resp 20  SpO2 98%  LMP 06/10/2015 Physical Exam  Constitutional: She is oriented to person, place, and time. She appears well-developed and well-nourished. No distress.  HENT:  Head: Normocephalic and atraumatic.  No intraoral swelling or tongue swelling.  Eyes: EOM are normal.  Neck: Neck supple. No tracheal deviation present.  Pulmonary/Chest: Effort normal and breath sounds normal. No accessory muscle usage. No respiratory distress.  No wheezing or respiratory distress.  Musculoskeletal: Normal range of motion.  Neurological: She is alert and oriented to person, place, and time.  Skin: Skin is warm and dry.  Diffuse urticaria to bilateral arms, chest, and legs  Psychiatric: She has a normal mood and affect. Her behavior is normal.  Nursing note and vitals reviewed.   ED Course  Procedures (including critical care time)  COORDINATION OF CARE: 2:39 PM- Discussed treatment plan with patient at bedside and patient agreed to plan.   Labs Review Labs Reviewed - No data to display  Imaging Review No results found.   EKG Interpretation None      MDM   Final diagnoses:  None   Patient has been on  steroids and Benadryl for the past few days. Her urticaria had resolved but then this morning acutely worsened. She presents with nausea, hypertension (178/110) no PMH of hypertension, sensation of throat tightness and dryness, severe pruritis and tachycardia in between 100-120. Patient will need IV fluids and steroids. There may be an anxiety component to the abnormal vital signs and Ativan will be given for this.   Patient moved to an acute bed for higher level of care than fast track.  Medications  sodium chloride 0.9 % bolus 1,000 mL (not administered)  methylPREDNISolone sodium succinate (SOLU-MEDROL) 125 mg/2 mL injection 125 mg (not administered)  famotidine (PEPCID) IVPB 20 mg premix (not administered)  LORazepam (ATIVAN) injection 0.5 mg (not administered)    Delos Haring, PA-C 07/10/15 1602  Noemi Chapel, MD 07/10/15 [phone]

## 2016-02-24 ENCOUNTER — Encounter (HOSPITAL_COMMUNITY): Payer: Self-pay

## 2016-02-24 ENCOUNTER — Emergency Department (HOSPITAL_COMMUNITY)
Admission: EM | Admit: 2016-02-24 | Discharge: 2016-02-24 | Disposition: A | Discharge status: Home / Self Care | Payer: BLUE CROSS/BLUE SHIELD | Attending: Emergency Medicine | Admitting: Emergency Medicine

## 2016-02-24 DIAGNOSIS — Z8659 Personal history of other mental and behavioral disorders: Secondary | ICD-10-CM | POA: Diagnosis not present

## 2016-02-24 DIAGNOSIS — Z7951 Long term (current) use of inhaled steroids: Secondary | ICD-10-CM | POA: Insufficient documentation

## 2016-02-24 DIAGNOSIS — Z79818 Long term (current) use of other agents affecting estrogen receptors and estrogen levels: Secondary | ICD-10-CM | POA: Diagnosis not present

## 2016-02-24 DIAGNOSIS — J45909 Unspecified asthma, uncomplicated: Secondary | ICD-10-CM | POA: Insufficient documentation

## 2016-02-24 DIAGNOSIS — Z79899 Other long term (current) drug therapy: Secondary | ICD-10-CM | POA: Diagnosis not present

## 2016-02-24 DIAGNOSIS — K529 Noninfective gastroenteritis and colitis, unspecified: Secondary | ICD-10-CM | POA: Insufficient documentation

## 2016-02-24 DIAGNOSIS — Z8619 Personal history of other infectious and parasitic diseases: Secondary | ICD-10-CM | POA: Diagnosis not present

## 2016-02-24 DIAGNOSIS — Z3202 Encounter for pregnancy test, result negative: Secondary | ICD-10-CM | POA: Diagnosis not present

## 2016-02-24 DIAGNOSIS — R197 Diarrhea, unspecified: Secondary | ICD-10-CM | POA: Diagnosis present

## 2016-02-24 LAB — CBC WITH DIFFERENTIAL/PLATELET
Basophils Absolute: 0 10*3/uL (ref 0.0–0.1)
Basophils Relative: 0 %
EOS PCT: 0 %
Eosinophils Absolute: 0 10*3/uL (ref 0.0–0.7)
HCT: 42.9 % (ref 36.0–46.0)
Hemoglobin: 14.3 g/dL (ref 12.0–15.0)
LYMPHS ABS: 0.4 10*3/uL — AB (ref 0.7–4.0)
LYMPHS PCT: 3 %
MCH: 30.1 pg (ref 26.0–34.0)
MCHC: 33.3 g/dL (ref 30.0–36.0)
MCV: 90.3 fL (ref 78.0–100.0)
MONO ABS: 0.4 10*3/uL (ref 0.1–1.0)
MONOS PCT: 4 %
Neutro Abs: 11.5 10*3/uL — ABNORMAL HIGH (ref 1.7–7.7)
Neutrophils Relative %: 93 %
PLATELETS: 369 10*3/uL (ref 150–400)
RBC: 4.75 MIL/uL (ref 3.87–5.11)
RDW: 14.2 % (ref 11.5–15.5)
WBC: 12.3 10*3/uL — AB (ref 4.0–10.5)

## 2016-02-24 LAB — COMPREHENSIVE METABOLIC PANEL
ALBUMIN: 3.9 g/dL (ref 3.5–5.0)
ALK PHOS: 73 U/L (ref 38–126)
ALT: 28 U/L (ref 14–54)
AST: 30 U/L (ref 15–41)
Anion gap: 11 (ref 5–15)
BUN: 13 mg/dL (ref 6–20)
CALCIUM: 8.7 mg/dL — AB (ref 8.9–10.3)
CHLORIDE: 104 mmol/L (ref 101–111)
CO2: 21 mmol/L — AB (ref 22–32)
CREATININE: 0.69 mg/dL (ref 0.44–1.00)
GFR calc Af Amer: >60 mL/min (ref >60)
GFR calc non Af Amer: >60 mL/min (ref >60)
GLUCOSE: 104 mg/dL — AB (ref 65–99)
Potassium: 4 mmol/L (ref 3.5–5.1)
SODIUM: 136 mmol/L (ref 135–145)
Total Bilirubin: 1 mg/dL (ref 0.3–1.2)
Total Protein: 7.4 g/dL (ref 6.5–8.1)

## 2016-02-24 LAB — I-STAT BETA HCG BLOOD, ED (MC, WL, AP ONLY)

## 2016-02-24 LAB — LIPASE, BLOOD: Lipase: 27 U/L (ref 11–51)

## 2016-02-24 MED ORDER — ONDANSETRON 8 MG PO TBDP: 8.0000 mg | ORAL_TABLET | Freq: Three times a day (TID) | ORAL | Status: DC | PRN

## 2016-02-24 MED ORDER — ONDANSETRON HCL 4 MG/2ML IJ SOLN
4.0000 mg | Freq: Once | INTRAMUSCULAR | Status: AC
  Administered 2016-02-24: 4 mg via INTRAVENOUS
  Filled 2016-02-24: qty 2

## 2016-02-24 MED ORDER — SODIUM CHLORIDE 0.9 % IV SOLN
INTRAVENOUS | Status: DC
  Administered 2016-02-24: 11:00:00 via INTRAVENOUS

## 2016-02-24 MED ORDER — SODIUM CHLORIDE 0.9 % IV BOLUS (SEPSIS)
1000.0000 mL | Freq: Once | INTRAVENOUS | Status: AC
  Administered 2016-02-24: 1000 mL via INTRAVENOUS

## 2016-02-24 NOTE — Discharge Instructions (Signed)

## 2016-02-24 NOTE — ED Notes (Signed)
She c/o several episodes of vomiting and diarrhea since 0500 this morning.  She denies fever, nor any other sign of current illness.

## 2016-02-24 NOTE — ED Provider Notes (Signed)
CSN: [id number]     Arrival date & time 02/24/16  0848 History   First MD Initiated Contact with Patient 02/24/16 1005     Chief Complaint  Patient presents with  . Emesis  . Diarrhea   HPI Started this am around 3 am w/ Multiple episodes of vomiting and diarrhea.   Lost count of the number of episodes.  No fevers.  She has been having stomach cramps with them.  No fevers.  No blood in the emesis or stool.  No recent travel.  No recent abx.  No history of intestinal issues.  No known ill contacts.  Sx are starting to get a little better but still very nauseous.   Past Medical History  Diagnosis Date  . Chicken pox   . Depression   . Seasonal allergies   . Asthma    No past surgical history on file. Family History  Problem Relation Age of Onset  . Colon cancer Maternal Grandfather   . Breast cancer Mother   . Stroke Maternal Grandmother   . Thyroid disease Maternal Grandmother   . Thyroid disease Mother    Social History  Substance Use Topics  . Smoking status: Never Smoker   . Smokeless tobacco: None  . Alcohol Use: Yes   OB History    No data available     Review of Systems  All other systems reviewed and are negative.     Allergies  Benadryl  Home Medications   Prior to Admission medications   Medication Sig Start Date End Date Taking? Authorizing Provider  beclomethasone (QVAR) 80 MCG/ACT inhaler Inhale 1 puff into the lungs daily.   Yes Historical Provider, MD  CRYSELLE-28 0.3-30 MG-MCG tablet Take 1 tablet by mouth daily.  11/29/12  Yes Historical Provider, MD  diphenhydrAMINE (BENADRYL) 25 mg capsule Take 25 mg by mouth every 6 (six) hours as needed for allergies.   Yes Historical Provider, MD  EPIPEN 2-PAK 0.3 MG/0.3ML DEVI Inject 0.3 mg into the skin once.  10/28/12  Yes Historical Provider, MD  famotidine (PEPCID) 20 MG tablet Take 1 tablet (20 mg total) by mouth 2 (two) times daily. Patient taking differently: Take 20 mg by mouth daily.  07/10/15  Yes Noemi Chapel, MD  fexofenadine (ALLEGRA) 180 MG tablet Take 180 mg by mouth daily.   Yes Historical Provider, MD  ibuprofen (ADVIL,MOTRIN) 200 MG tablet Take 600 mg by mouth every 6 (six) hours as needed.   Yes Historical Provider, MD  levocetirizine (XYZAL) 5 MG tablet Take 5 mg by mouth daily.   Yes Historical Provider, MD  Triamcinolone Acetonide (NASACORT ALLERGY 24HR NA) Place 2 sprays into the nose daily.   Yes Historical Provider, MD  famotidine (PEPCID) 20 MG tablet Take 1 tablet (20 mg total) by mouth 2 (two) times daily. Patient not taking: Reported on 02/24/2016 07/10/15   Noemi Chapel, MD   BP 151/80 mmHg  Pulse 95  Temp(Src) 98.7 F (37.1 C) (Oral)  Resp 18  SpO2 99%  LMP 02/22/2016 (Exact Date) Physical Exam  Constitutional: She appears well-developed and well-nourished. No distress.  HENT:  Head: Normocephalic and atraumatic.  Right Ear: External ear normal.  Left Ear: External ear normal.  Eyes: Conjunctivae are normal. Right eye exhibits no discharge. Left eye exhibits no discharge. No scleral icterus.  Neck: Neck supple. No tracheal deviation present.  Cardiovascular: Normal rate, regular rhythm and intact distal pulses.   Pulmonary/Chest: Effort normal and breath sounds normal. No stridor.  No respiratory distress. She has no wheezes. She has no rales.  Abdominal: Soft. Bowel sounds are normal. She exhibits no distension. There is no tenderness. There is no rebound and no guarding.  No focal ttp, palpation causes nausea  Musculoskeletal: She exhibits no edema or tenderness.  Neurological: She is alert. She has normal strength. No cranial nerve deficit (no facial droop, extraocular movements intact, no slurred speech) or sensory deficit. She exhibits normal muscle tone. She displays no seizure activity. Coordination normal.  Skin: Skin is warm and dry. No rash noted.  Psychiatric: She has a normal mood and affect.  Nursing note and vitals reviewed.   ED Course  Procedures  (including critical care time) Labs Review Labs Reviewed  COMPREHENSIVE METABOLIC PANEL - Abnormal; Notable for the following:    CO2 21 (*)    Glucose, Bld 104 (*)    Calcium 8.7 (*)    All other components within normal limits  CBC WITH DIFFERENTIAL/PLATELET - Abnormal; Notable for the following:    WBC 12.3 (*)    Neutro Abs 11.5 (*)    Lymphs Abs 0.4 (*)    All other components within normal limits  LIPASE, BLOOD  I-STAT BETA HCG BLOOD, ED ([facility], [facility], [facility] ONLY)    Medications  sodium chloride 0.9 % bolus 1,000 mL (1,000 mLs Intravenous New Bag/Given 02/24/16 1047)    And  0.9 %  sodium chloride infusion ( Intravenous New Bag/Given 02/24/16 1048)  ondansetron (ZOFRAN) injection 4 mg (4 mg Intravenous Given 02/24/16 1046)     MDM   Final diagnoses:  Gastroenteritis    Patient's symptoms are most suggestive of a viral gastroenteritis. Symptoms are already improving. She responded well to IV fluids and antinausea medications. The patient's feeling better now and has not had any further episodes of nausea or vomiting.  Plan on discharge home with prescriptions for antinausea medications. Follow-up with her primary doctor next week if symptoms have not resolved. Return to the ER for worsening symptoms, fever, pain migrating to the right lower quadrant   Dorie Rank, MD 02/24/16 1220

## 2016-02-24 NOTE — ED Notes (Signed)
RN Manuela Schwartz starting ultrasound IV

## 2017-09-11 ENCOUNTER — Encounter: Payer: Self-pay | Admitting: Family Medicine

## 2018-01-01 ENCOUNTER — Encounter: Payer: Self-pay | Admitting: Family Medicine

## 2018-08-03 ENCOUNTER — Emergency Department (HOSPITAL_COMMUNITY): Payer: PRIVATE HEALTH INSURANCE

## 2018-08-03 ENCOUNTER — Encounter (HOSPITAL_COMMUNITY): Payer: Self-pay | Admitting: Emergency Medicine

## 2018-08-03 ENCOUNTER — Emergency Department (HOSPITAL_COMMUNITY)
Admission: EM | Admit: 2018-08-03 | Discharge: 2018-08-03 | Disposition: A | Discharge status: Home / Self Care | Payer: PRIVATE HEALTH INSURANCE | Attending: Emergency Medicine | Admitting: Emergency Medicine

## 2018-08-03 DIAGNOSIS — D259 Leiomyoma of uterus, unspecified: Secondary | ICD-10-CM | POA: Insufficient documentation

## 2018-08-03 DIAGNOSIS — R197 Diarrhea, unspecified: Secondary | ICD-10-CM

## 2018-08-03 DIAGNOSIS — R112 Nausea with vomiting, unspecified: Secondary | ICD-10-CM | POA: Diagnosis present

## 2018-08-03 DIAGNOSIS — Z79899 Other long term (current) drug therapy: Secondary | ICD-10-CM | POA: Insufficient documentation

## 2018-08-03 DIAGNOSIS — J45909 Unspecified asthma, uncomplicated: Secondary | ICD-10-CM | POA: Insufficient documentation

## 2018-08-03 LAB — COMPREHENSIVE METABOLIC PANEL
ALBUMIN: 4.4 g/dL (ref 3.5–5.0)
ALK PHOS: 80 U/L (ref 38–126)
ALT: 20 U/L (ref 0–44)
AST: 22 U/L (ref 15–41)
Anion gap: 11 (ref 5–15)
BUN: 9 mg/dL (ref 6–20)
CALCIUM: 9.4 mg/dL (ref 8.9–10.3)
CO2: 23 mmol/L (ref 22–32)
Chloride: 105 mmol/L (ref 98–111)
Creatinine, Ser: 0.73 mg/dL (ref 0.44–1.00)
GFR calc Af Amer: >60 mL/min (ref >60)
GFR calc non Af Amer: >60 mL/min (ref >60)
Glucose, Bld: 119 mg/dL — ABNORMAL HIGH (ref 70–99)
POTASSIUM: 3.9 mmol/L (ref 3.5–5.1)
Sodium: 139 mmol/L (ref 135–145)
Total Bilirubin: 0.6 mg/dL (ref 0.3–1.2)
Total Protein: 8.1 g/dL (ref 6.5–8.1)

## 2018-08-03 LAB — CBC WITH DIFFERENTIAL/PLATELET
BASOS PCT: 1 %
Basophils Absolute: 0.1 10*3/uL (ref 0.0–0.1)
Eosinophils Absolute: 0 10*3/uL (ref 0.0–0.7)
Eosinophils Relative: 0 %
HCT: 40.5 % (ref 36.0–46.0)
HEMOGLOBIN: 13.8 g/dL (ref 12.0–15.0)
Lymphocytes Relative: 10 %
Lymphs Abs: 1.1 10*3/uL (ref 0.7–4.0)
MCH: 29.9 pg (ref 26.0–34.0)
MCHC: 34.1 g/dL (ref 30.0–36.0)
MCV: 87.7 fL (ref 78.0–100.0)
Monocytes Absolute: 0.3 10*3/uL (ref 0.1–1.0)
Monocytes Relative: 3 %
NEUTROS ABS: 9.4 10*3/uL — AB (ref 1.7–7.7)
NEUTROS PCT: 86 %
Platelets: 413 10*3/uL — ABNORMAL HIGH (ref 150–400)
RBC: 4.62 MIL/uL (ref 3.87–5.11)
RDW: 14.3 % (ref 11.5–15.5)
WBC: 10.9 10*3/uL — ABNORMAL HIGH (ref 4.0–10.5)

## 2018-08-03 LAB — I-STAT BETA HCG BLOOD, ED (MC, WL, AP ONLY)

## 2018-08-03 LAB — LIPASE, BLOOD: Lipase: 29 U/L (ref 11–51)

## 2018-08-03 LAB — I-STAT CG4 LACTIC ACID, ED: Lactic Acid, Venous: 1.15 mmol/L (ref 0.5–1.9)

## 2018-08-03 IMAGING — CT CT ABD-PELV W/ CM
2 of 4 series · 17 of 46 positions shown, 19 images · IV contrast (iopamidol)
Comparison: None.

CLINICAL DATA: Acute low back pain.

EXAM:
CT ABDOMEN AND PELVIS WITH CONTRAST
TECHNIQUE: Multidetector CT imaging of the abdomen and pelvis was performed
using the standard protocol following bolus administration of
intravenous contrast.
CONTRAST:  100mL [05] IOPAMIDOL ([05]) INJECTION 61%

[Series 2: axial st · axial · 0.69mm/px · z∈[+1134,+1514]mm · 14 of 88 slices shown, 16 images]
[im 6/88  soft-tissue]
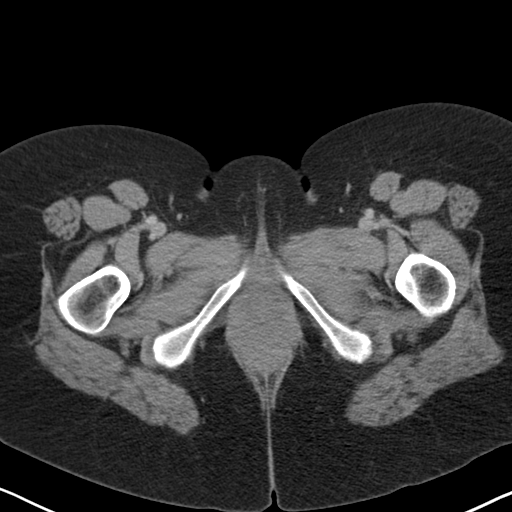
[im 6/88  bone]
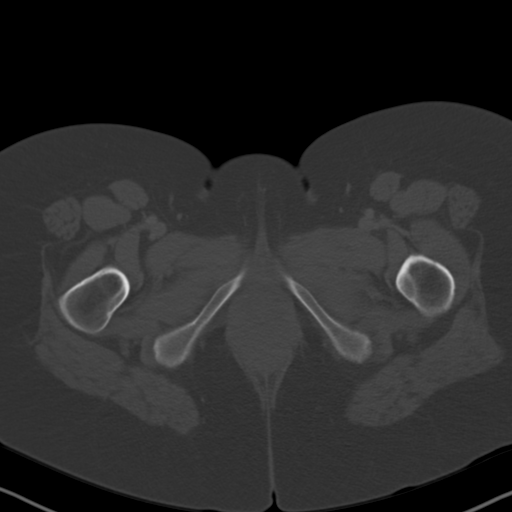
[im 11/88  soft-tissue]
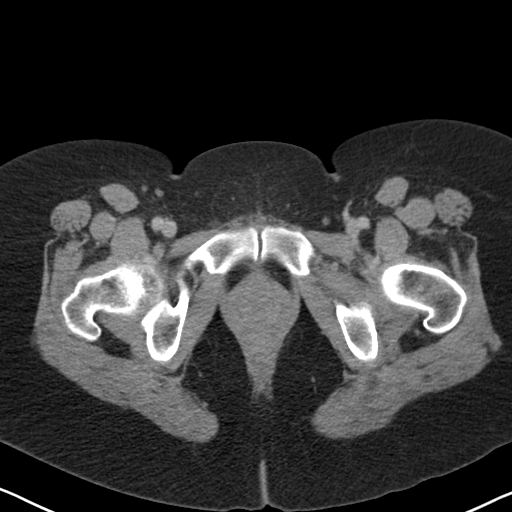
[im 16/88  soft-tissue]
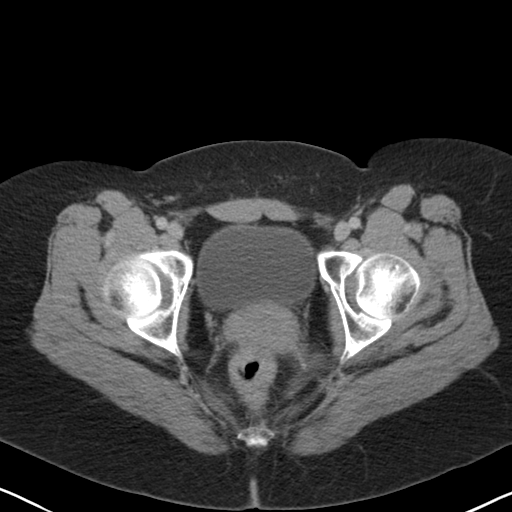
[im 26/88  soft-tissue]
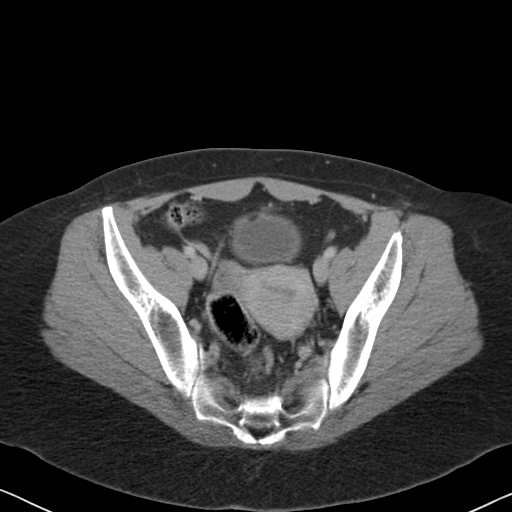
[im 31/88  soft-tissue]
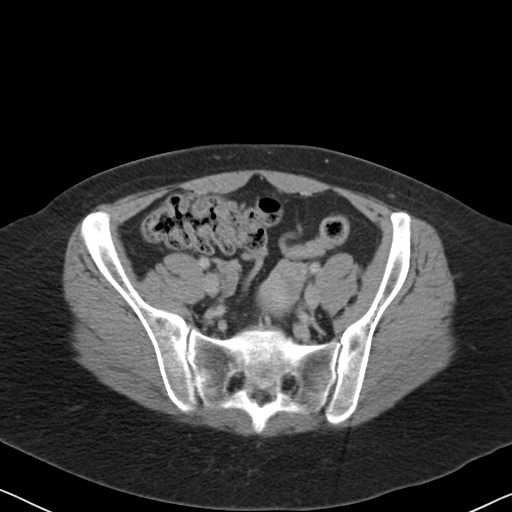
[im 36/88  soft-tissue]
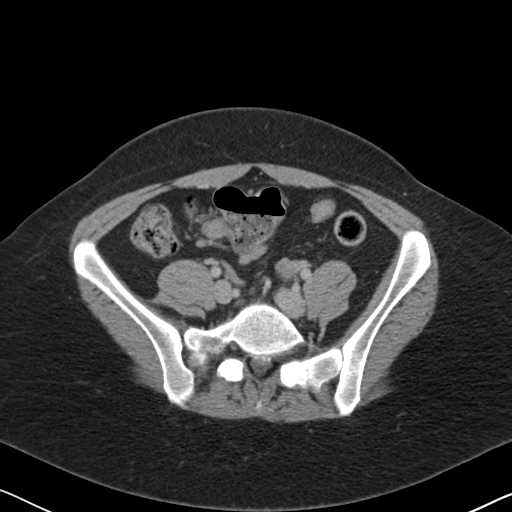
[im 41/88  soft-tissue]
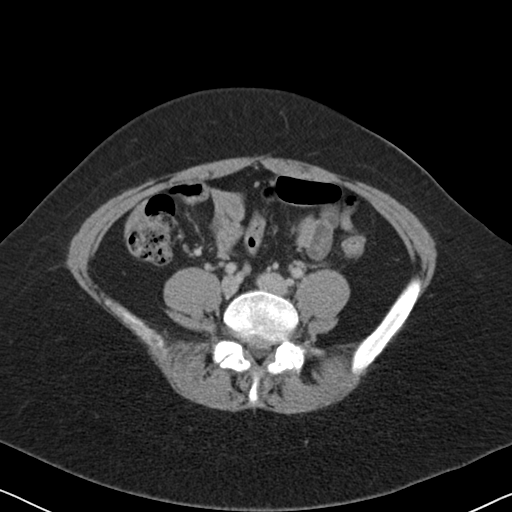
[im 47/88  soft-tissue]
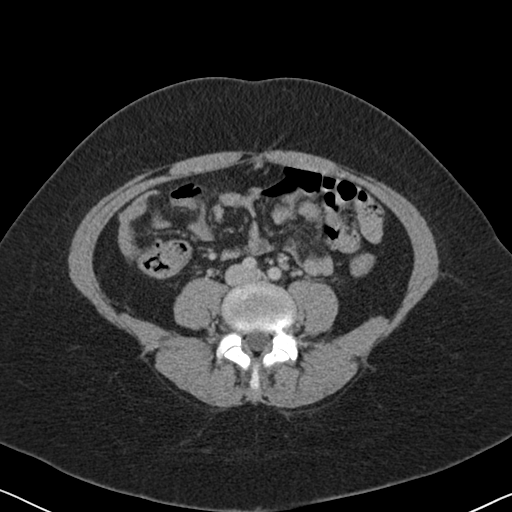
[im 52/88  soft-tissue]
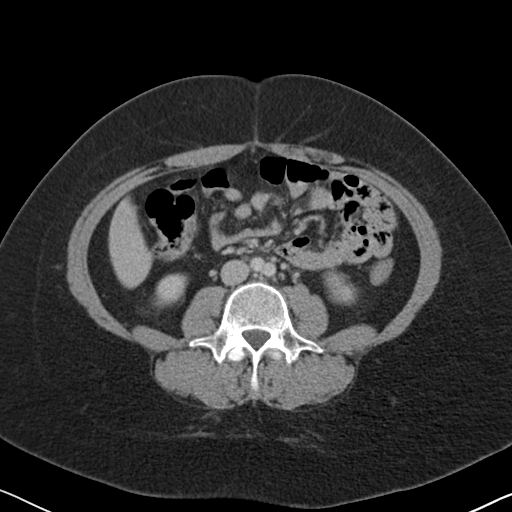
[im 52/88  bone]
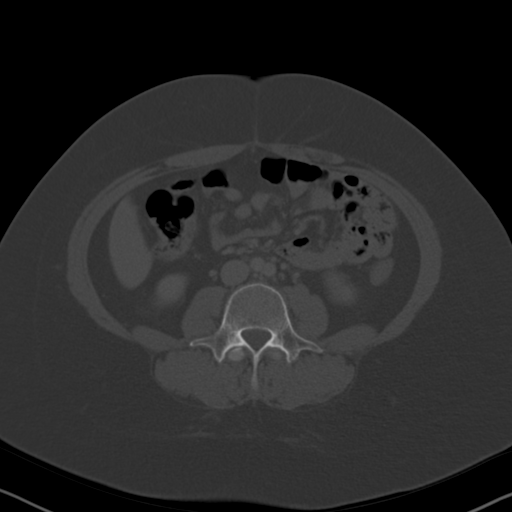
[im 57/88  soft-tissue]
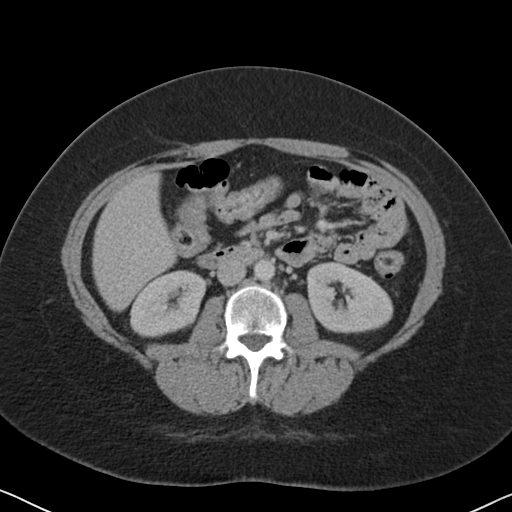
[im 67/88  soft-tissue]
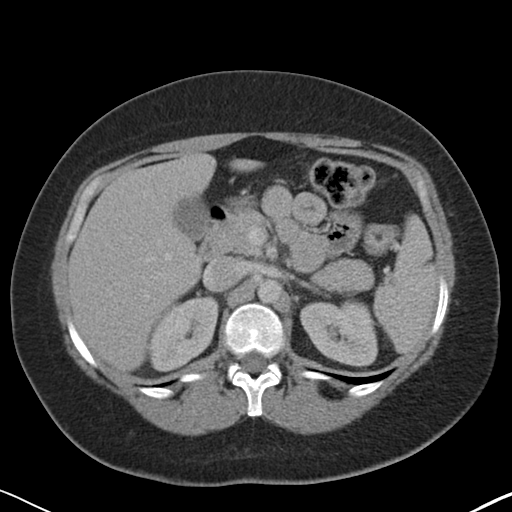
[im 72/88  soft-tissue]
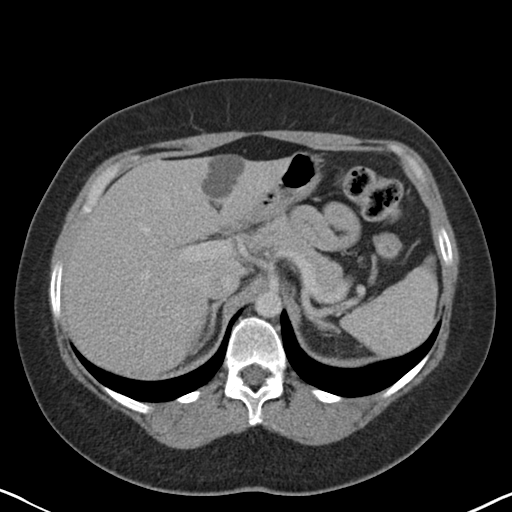
[im 77/88  soft-tissue]
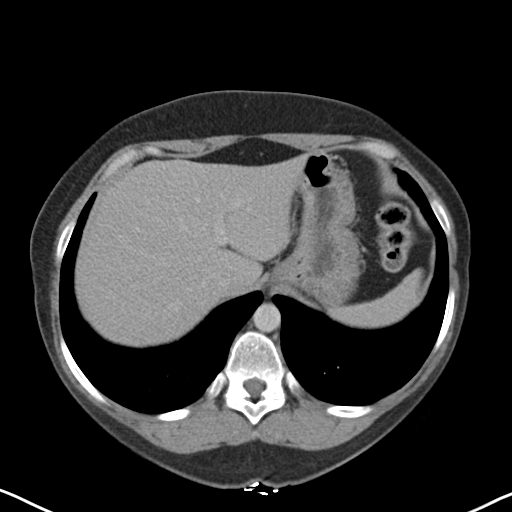
[im 82/88  soft-tissue]
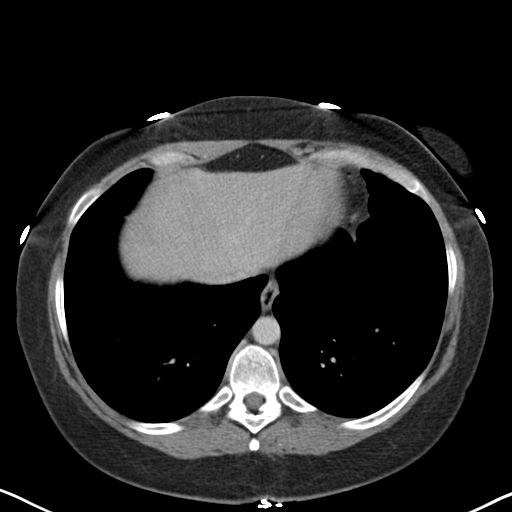

[Series 4: coronal st · coronal · 0.86mm/px · 3 of 72 slices shown]
[im 24/72  soft-tissue]
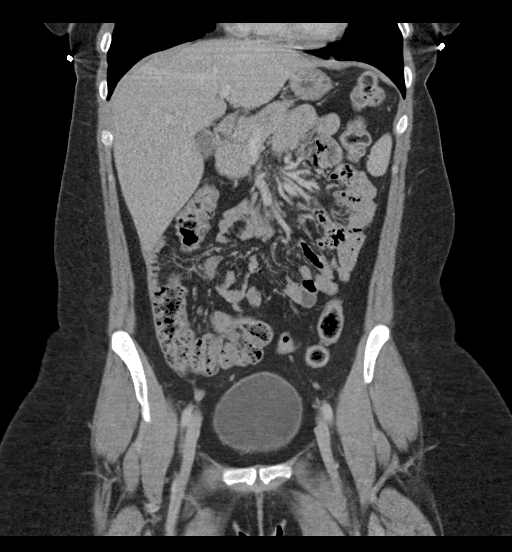
[im 32/72  soft-tissue]
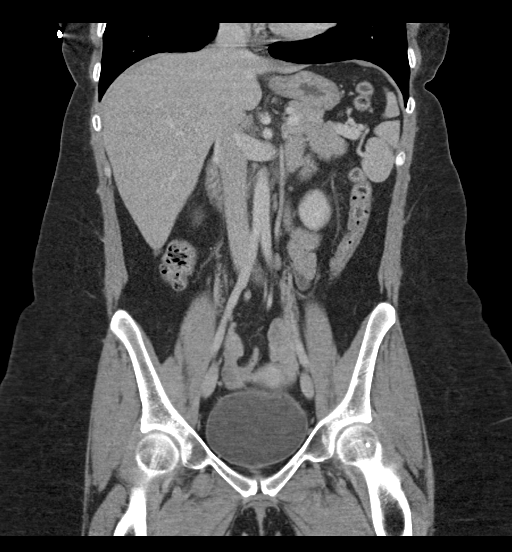
[im 40/72  soft-tissue]
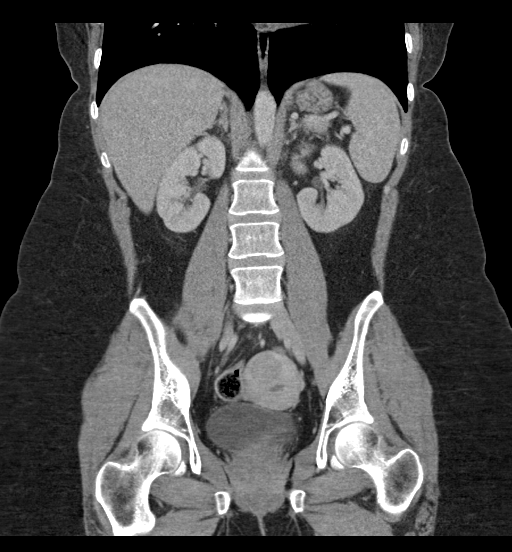

[17 of 46 positions shown; findings below may reference images not displayed]

FINDINGS: Lower chest: No acute abnormality.

Hepatobiliary: No gallstones or biliary dilatation is noted. Left
hepatic cysts are noted.

Pancreas: Unremarkable. No pancreatic ductal dilatation or
surrounding inflammatory changes.

Spleen: Normal in size without focal abnormality.

Adrenals/Urinary Tract: Adrenal glands are unremarkable. Kidneys are
normal, without renal calculi, focal lesion, or hydronephrosis.
Bladder is unremarkable.

Stomach/Bowel: Stomach is within normal limits. Appendix appears
normal. No evidence of bowel wall thickening, distention, or
inflammatory changes.

Vascular/Lymphatic: No significant vascular findings are present. No
enlarged abdominal or pelvic lymph nodes.

Reproductive: 3.3 cm uterine fibroid is noted. No adnexal
abnormality is noted.

Other: No abdominal wall hernia or abnormality. No abdominopelvic
ascites.

Musculoskeletal: No acute or significant osseous findings.
IMPRESSION: Probable uterine fibroid.

No hydronephrosis or renal obstruction is noted. No renal or
ureteral calculi are noted.

Left hepatic cysts.

No other abnormality seen in the abdomen or pelvis.

## 2018-08-03 MED ORDER — IOPAMIDOL (ISOVUE-300) INJECTION 61%
INTRAVENOUS | Status: AC
  Filled 2018-08-03: qty 100

## 2018-08-03 MED ORDER — PROMETHAZINE HCL 25 MG RE SUPP: 25.0000 mg | Freq: Three times a day (TID) | RECTAL | 0 refills | Status: DC | PRN

## 2018-08-03 MED ORDER — PROMETHAZINE HCL 25 MG/ML IJ SOLN
25.0000 mg | Freq: Once | INTRAMUSCULAR | Status: AC
  Administered 2018-08-03: 25 mg via INTRAVENOUS
  Filled 2018-08-03: qty 1

## 2018-08-03 MED ORDER — SODIUM CHLORIDE 0.9 % IV BOLUS
1000.0000 mL | Freq: Once | INTRAVENOUS | Status: AC
  Administered 2018-08-03: 1000 mL via INTRAVENOUS

## 2018-08-03 MED ORDER — IOPAMIDOL (ISOVUE-300) INJECTION 61%
100.0000 mL | Freq: Once | INTRAVENOUS | Status: AC | PRN
  Administered 2018-08-03: 100 mL via INTRAVENOUS

## 2018-08-03 MED ORDER — KETOROLAC TROMETHAMINE 15 MG/ML IJ SOLN
15.0000 mg | Freq: Once | INTRAMUSCULAR | Status: AC
Start: 2018-08-03 — End: 2018-08-03
  Administered 2018-08-03: 15 mg via INTRAVENOUS
  Filled 2018-08-03: qty 1

## 2018-08-03 MED ORDER — MORPHINE SULFATE (PF) 4 MG/ML IV SOLN: 4.0000 mg | Freq: Once | INTRAVENOUS | Status: DC

## 2018-08-03 MED ORDER — ONDANSETRON 4 MG PO TBDP: 4.0000 mg | ORAL_TABLET | Freq: Three times a day (TID) | ORAL | 0 refills | Status: DC | PRN

## 2018-08-03 MED ORDER — NAPROXEN 375 MG PO TABS: 375.0000 mg | ORAL_TABLET | Freq: Two times a day (BID) | ORAL | 0 refills | Status: AC

## 2018-08-03 NOTE — ED Notes (Signed)
Patient transported to CT 

## 2018-08-03 NOTE — ED Triage Notes (Signed)
Pt c/o back pain for 2 days. Thought was related to recently working out. During the night started having n/v/d.

## 2018-08-03 NOTE — ED Provider Notes (Signed)
[facility] DEPT Provider Note   CSN: [id number] Arrival date & time: 08/03/18  1001     History   Chief Complaint Chief Complaint  Patient presents with  . Emesis  . Diarrhea  . Back Pain    HPI Jean Woods is a 32 y.o. female.  HPI 32 year old female no significant past medical history here with nausea, vomiting, and back pain.  Patient states her symptoms are read as gradually worsening bilateral lower back pain yesterday.  She thought it was due to recently started working out.  She did not have any associated urinary symptoms.  Upon trying to go to bed, she has developed progressive worsening nausea, vomiting, and diarrhea.  She has had some mild right-sided abdominal pain as well.  No urinary symptoms.  No vaginal bleeding or discharge.  Pain is an aching, gnawing, cramping sensation.  Is worse with eating and palpation.  Denies any alleviating factors.  She has never had intra-abdominal surgery.  No other complaints.  Past Medical History:  Diagnosis Date  . Asthma   . Chicken pox   . Depression   . Seasonal allergies     Patient Active Problem List   Diagnosis Date Noted  . Cough 03/12/2013  . Cat allergies 03/12/2013  . Medication reaction 03/12/2013  . Weight gain 12/25/2012  . Hypercholesterolemia 12/25/2012    History reviewed. No pertinent surgical history.   OB History   None      Home Medications    Prior to Admission medications   Medication Sig Start Date End Date Taking? Authorizing Provider  albuterol (PROVENTIL HFA;VENTOLIN HFA) 108 (90 Base) MCG/ACT inhaler Inhale 1 puff into the lungs every 6 (six) hours as needed for wheezing or shortness of breath.   Yes [provider]  beclomethasone (QVAR) 80 MCG/ACT inhaler Inhale 1 puff into the lungs daily.   Yes [provider]  diphenhydrAMINE (BENADRYL) 25 mg capsule Take 25 mg by mouth every 6 (six) hours as needed for allergies.   Yes  [provider]  famotidine (PEPCID) 20 MG tablet Take 1 tablet (20 mg total) by mouth 2 (two) times daily. Patient taking differently: Take 20 mg by mouth daily.  07/10/15  Yes Noemi Chapel, MD  fexofenadine (ALLEGRA) 180 MG tablet Take 180 mg by mouth daily.   Yes [provider]  ibuprofen (ADVIL,MOTRIN) 200 MG tablet Take 600 mg by mouth every 6 (six) hours as needed for headache, mild pain, moderate pain or cramping.    Yes [provider]  Melatonin 5 MG TABS Take 5 mg by mouth at bedtime.   Yes [provider]  Triamcinolone Acetonide (NASACORT ALLERGY 24HR NA) Place 2 sprays into the nose daily.   Yes [provider]  famotidine (PEPCID) 20 MG tablet Take 1 tablet (20 mg total) by mouth 2 (two) times daily. Patient not taking: Reported on 02/24/2016 07/10/15   Noemi Chapel, MD  naproxen (NAPROSYN) 375 MG tablet Take 1 tablet (375 mg total) by mouth 2 (two) times daily with a meal for 5 days. 08/03/18 08/08/18  Duffy Bruce, MD  ondansetron (ZOFRAN ODT) 4 MG disintegrating tablet Take 1 tablet (4 mg total) by mouth every 8 (eight) hours as needed for nausea or vomiting. 08/03/18   Duffy Bruce, MD  promethazine (PHENERGAN) 25 MG suppository Place 1 suppository (25 mg total) rectally every 8 (eight) hours as needed for refractory nausea / vomiting. 08/03/18   Duffy Bruce, MD  Family History Family History  Problem Relation Age of Onset  . Colon cancer Maternal Grandfather   . Breast cancer Mother   . Thyroid disease Mother   . Stroke Maternal Grandmother   . Thyroid disease Maternal Grandmother     Social History Social History   Tobacco Use  . Smoking status: Never Smoker  . Smokeless tobacco: Never Used  Substance Use Topics  . Alcohol use: Yes  . Drug use: Not on file     Allergies   Benadryl [diphenhydramine hcl]   Review of Systems Review of Systems  Constitutional: Positive for fatigue. Negative for chills and  fever.  HENT: Negative for congestion and rhinorrhea.   Eyes: Negative for visual disturbance.  Respiratory: Negative for cough, shortness of breath and wheezing.   Cardiovascular: Negative for chest pain and leg swelling.  Gastrointestinal: Positive for abdominal pain, diarrhea, nausea and vomiting.  Genitourinary: Negative for dysuria and flank pain.  Musculoskeletal: Negative for neck pain and neck stiffness.  Skin: Negative for rash and wound.  Allergic/Immunologic: Negative for immunocompromised state.  Neurological: Negative for syncope, weakness and headaches.  All other systems reviewed and are negative.    Physical Exam Updated Vital Signs BP (!) 151/95 (BP Location: Right Arm)   Pulse (!) 103   Temp 97.6 F (36.4 C) (Oral)   Resp 16   Ht 5\' 4"  (1.626 m)   Wt 99.8 kg   LMP 07/23/2018   SpO2 99%   BMI 37.76 kg/m   Physical Exam  Constitutional: She is oriented to person, place, and time. She appears well-developed and well-nourished. No distress.  HENT:  Head: Normocephalic and atraumatic.  Mildly dry mucous membranes  Eyes: Conjunctivae are normal.  Neck: Neck supple.  Cardiovascular: Normal rate, regular rhythm and normal heart sounds. Exam reveals no friction rub.  No murmur heard. Pulmonary/Chest: Effort normal and breath sounds normal. No respiratory distress. She has no wheezes. She has no rales.  Abdominal: She exhibits no distension. There is tenderness. There is no rebound and no guarding.  Moderate tenderness to palpation over the right lower quadrant.  No rebound or guarding.  Musculoskeletal: She exhibits no edema.  Neurological: She is alert and oriented to person, place, and time. She exhibits normal muscle tone.  Skin: Skin is warm. Capillary refill takes less than 2 seconds.  Psychiatric: She has a normal mood and affect.  Nursing note and vitals reviewed.    ED Treatments / Results  Labs (all labs ordered are listed, but only abnormal  results are displayed) Labs Reviewed  CBC WITH DIFFERENTIAL/PLATELET - Abnormal; Notable for the following components:      Result Value   WBC 10.9 (*)    Platelets 413 (*)    Neutro Abs 9.4 (*)    All other components within normal limits  COMPREHENSIVE METABOLIC PANEL - Abnormal; Notable for the following components:   Glucose, Bld 119 (*)    All other components within normal limits  LIPASE, BLOOD  I-STAT CG4 LACTIC ACID, ED  I-STAT BETA HCG BLOOD, ED ([facility], [facility], [facility] ONLY)    EKG None  Radiology Ct Abdomen Pelvis W Contrast  Result Date: 08/03/2018 CLINICAL DATA:  Acute low back pain. EXAM: CT ABDOMEN AND PELVIS WITH CONTRAST TECHNIQUE: Multidetector CT imaging of the abdomen and pelvis was performed using the standard protocol following bolus administration of intravenous contrast. CONTRAST:  141mL ISOVUE-300 IOPAMIDOL (ISOVUE-300) INJECTION 61% COMPARISON:  None. FINDINGS: Lower chest: No acute abnormality. Hepatobiliary: No  gallstones or biliary dilatation is noted. Left hepatic cysts are noted. Pancreas: Unremarkable. No pancreatic ductal dilatation or surrounding inflammatory changes. Spleen: Normal in size without focal abnormality. Adrenals/Urinary Tract: Adrenal glands are unremarkable. Kidneys are normal, without renal calculi, focal lesion, or hydronephrosis. Bladder is unremarkable. Stomach/Bowel: Stomach is within normal limits. Appendix appears normal. No evidence of bowel wall thickening, distention, or inflammatory changes. Vascular/Lymphatic: No significant vascular findings are present. No enlarged abdominal or pelvic lymph nodes. Reproductive: 3.3 cm uterine fibroid is noted. No adnexal abnormality is noted. Other: No abdominal wall hernia or abnormality. No abdominopelvic ascites. Musculoskeletal: No acute or significant osseous findings. IMPRESSION: Probable uterine fibroid. No hydronephrosis or renal obstruction is noted. No renal or ureteral calculi are noted. Left hepatic  cysts. No other abnormality seen in the abdomen or pelvis. Electronically Signed   By: Marijo Conception, M.D.   On: 08/03/2018 12:23    Procedures Procedures (including critical care time)  Medications Ordered in ED Medications  morphine 4 MG/ML injection 4 mg (4 mg Intravenous Refused 08/03/18 1156)  iopamidol (ISOVUE-300) 61 % injection (has no administration in time range)  sodium chloride 0.9 % bolus 1,000 mL (0 mLs Intravenous Stopped 08/03/18 1431)  promethazine (PHENERGAN) injection 25 mg (25 mg Intravenous Given 08/03/18 1058)  sodium chloride 0.9 % bolus 1,000 mL (0 mLs Intravenous Stopped 08/03/18 1156)  ketorolac (TORADOL) 15 MG/ML injection 15 mg (15 mg Intravenous Given 08/03/18 1157)  iopamidol (ISOVUE-300) 61 % injection 100 mL (100 mLs Intravenous Contrast Given 08/03/18 1204)     Initial Impression / Assessment and Plan / ED Course  I have reviewed the triage vital signs and the nursing notes.  Pertinent labs & imaging results that were available during my care of the patient were reviewed by me and considered in my medical decision making (see chart for details).     32 year old female with no significant past medical history here with nausea, vomiting, and diarrhea.  This began after eating [place] food last night.  I suspect this is possible foodborne versus viral GI illness.  She has a mild leukocytosis which I suspect is reactive.  CT scan obtained and is negative for any acute abnormality.  She is unable to provide UA but denies any urinary sx, has no flank pain, doubt UTI and this would be asymptomatic bacteriuria. She has an incidentally noted uterine fibroid which I have discussed with her.  Patient given fluids and symptom medic control with good relief.  She is able to eat and drink now.  Her vital signs remained stable.  Given her well appearance with reassuring labs and negative CT for acute on normality, will discharge with supportive care and outpatient  follow-up.  Final Clinical Impressions(s) / ED Diagnoses   Final diagnoses:  Nausea vomiting and diarrhea  Uterine leiomyoma, unspecified location    ED Discharge Orders         Ordered    ondansetron (ZOFRAN ODT) 4 MG disintegrating tablet  Every 8 hours PRN     08/03/18 1343    promethazine (PHENERGAN) 25 MG suppository  Every 8 hours PRN     08/03/18 1343    naproxen (NAPROSYN) 375 MG tablet  2 times daily with meals     08/03/18 1343           Duffy Bruce, MD 08/03/18 1528

## 2018-12-24 ENCOUNTER — Encounter (HOSPITAL_COMMUNITY): Payer: Self-pay | Admitting: *Deleted

## 2018-12-24 ENCOUNTER — Other Ambulatory Visit: Payer: Self-pay

## 2018-12-24 ENCOUNTER — Emergency Department (HOSPITAL_COMMUNITY)
Admission: EM | Admit: 2018-12-24 | Discharge: 2018-12-25 | Disposition: A | Discharge status: Home / Self Care | Payer: PRIVATE HEALTH INSURANCE | Attending: Emergency Medicine | Admitting: Emergency Medicine

## 2018-12-24 DIAGNOSIS — S61451A Open bite of right hand, initial encounter: Secondary | ICD-10-CM

## 2018-12-24 DIAGNOSIS — Y9389 Activity, other specified: Secondary | ICD-10-CM | POA: Insufficient documentation

## 2018-12-24 DIAGNOSIS — S61551A Open bite of right wrist, initial encounter: Secondary | ICD-10-CM | POA: Insufficient documentation

## 2018-12-24 DIAGNOSIS — Y999 Unspecified external cause status: Secondary | ICD-10-CM | POA: Insufficient documentation

## 2018-12-24 DIAGNOSIS — Z23 Encounter for immunization: Secondary | ICD-10-CM | POA: Insufficient documentation

## 2018-12-24 DIAGNOSIS — S01351A Open bite of right ear, initial encounter: Secondary | ICD-10-CM | POA: Insufficient documentation

## 2018-12-24 DIAGNOSIS — W540XXA Bitten by dog, initial encounter: Secondary | ICD-10-CM | POA: Insufficient documentation

## 2018-12-24 DIAGNOSIS — J45909 Unspecified asthma, uncomplicated: Secondary | ICD-10-CM | POA: Insufficient documentation

## 2018-12-24 DIAGNOSIS — Y929 Unspecified place or not applicable: Secondary | ICD-10-CM | POA: Insufficient documentation

## 2018-12-24 DIAGNOSIS — S0185XA Open bite of other part of head, initial encounter: Secondary | ICD-10-CM

## 2018-12-24 NOTE — ED Triage Notes (Signed)
Pt stated "My husband and I were having an argument and the dog freaked out and bit me."  Pt presents with 5 lacs to right side of face and right wrist.  Bleeding controlled @ present.

## 2018-12-25 MED ORDER — TETANUS-DIPHTH-ACELL PERTUSSIS 5-2.5-18.5 LF-MCG/0.5 IM SUSP
0.5000 mL | Freq: Once | INTRAMUSCULAR | Status: AC
  Administered 2018-12-25: 0.5 mL via INTRAMUSCULAR
  Filled 2018-12-25: qty 0.5

## 2018-12-25 MED ORDER — LIDOCAINE-EPINEPHRINE (PF) 2 %-1:200000 IJ SOLN
20.0000 mL | Freq: Once | INTRAMUSCULAR | Status: AC
  Administered 2018-12-25: 20 mL
  Filled 2018-12-25: qty 20

## 2018-12-25 MED ORDER — TRAMADOL HCL 50 MG PO TABS: 50.0000 mg | ORAL_TABLET | Freq: Four times a day (QID) | ORAL | 0 refills | Status: DC | PRN

## 2018-12-25 MED ORDER — LORAZEPAM 0.5 MG PO TABS
0.5000 mg | ORAL_TABLET | Freq: Once | ORAL | Status: AC
  Administered 2018-12-25: 0.5 mg via ORAL
  Filled 2018-12-25: qty 1

## 2018-12-25 MED ORDER — AMOXICILLIN-POT CLAVULANATE 875-125 MG PO TABS: 1.0000 | ORAL_TABLET | Freq: Two times a day (BID) | ORAL | 0 refills | Status: DC

## 2018-12-25 NOTE — Discharge Instructions (Addendum)
WOUND CARE   Keep area clean and dry for 24 hours. Do not remove bandage, if applied.  After 24 hours, remove bandage and wash wound gently with mild soap and warm water. Reapply a new bandage after cleaning wound, if directed.  Continue daily cleansing with soap and water until stitches/staples are removed or dissolve.  Do not apply any ointments or creams to the wound while stitches/staples are in place, as this may cause delayed healing.  Seek medical careif you experience any of the following signs of infection: Swelling, redness, pus drainage, streaking, fever >101.0 F  Seek care if you experience excessive bleeding that does not stop after 15-20 minutes of constant, firm pressure.

## 2018-12-25 NOTE — ED Provider Notes (Signed)
[facility] DEPT Provider Note   CSN: [id number] Arrival date & time: 12/24/18  2326     History   Chief Complaint Chief Complaint  Patient presents with  . Animal Bite    HPI Jean Woods is a 33 y.o. female who presents emergency department chief complaint of dog bite.  Patient states that her dog bit her in the face and right ear and also on the right wrist just prior to arrival.  Her last tetanus vaccination was 10 years ago.  The dog is up-to-date on its vaccinations.  She denies any numbness or tingling.  HPI  Past Medical History:  Diagnosis Date  . Asthma   . Chicken pox   . Depression   . Seasonal allergies     Patient Active Problem List   Diagnosis Date Noted  . Cough 03/12/2013  . Cat allergies 03/12/2013  . Medication reaction 03/12/2013  . Weight gain 12/25/2012  . Hypercholesterolemia 12/25/2012    History reviewed. No pertinent surgical history.   OB History   No obstetric history on file.      Home Medications    Prior to Admission medications   Medication Sig Start Date End Date Taking? Authorizing Provider  albuterol (PROVENTIL HFA;VENTOLIN HFA) 108 (90 Base) MCG/ACT inhaler Inhale 1 puff into the lungs every 6 (six) hours as needed for wheezing or shortness of breath.   Yes [provider]  beclomethasone (QVAR) 80 MCG/ACT inhaler Inhale 1 puff into the lungs daily.   Yes [provider]  famotidine (PEPCID) 20 MG tablet Take 1 tablet (20 mg total) by mouth 2 (two) times daily. Patient taking differently: Take 20 mg by mouth daily.  07/10/15  Yes Noemi Chapel, MD  fexofenadine (ALLEGRA) 180 MG tablet Take 180 mg by mouth daily.   Yes [provider]  ibuprofen (ADVIL,MOTRIN) 200 MG tablet Take 600 mg by mouth every 6 (six) hours as needed for headache, mild pain, moderate pain or cramping.    Yes [provider]  Triamcinolone Acetonide (NASACORT ALLERGY 24HR NA) Place 2  sprays into the nose daily.   Yes [provider]  famotidine (PEPCID) 20 MG tablet Take 1 tablet (20 mg total) by mouth 2 (two) times daily. Patient not taking: Reported on 02/24/2016 07/10/15   Noemi Chapel, MD  ondansetron (ZOFRAN ODT) 4 MG disintegrating tablet Take 1 tablet (4 mg total) by mouth every 8 (eight) hours as needed for nausea or vomiting. Patient not taking: Reported on 12/25/2018 08/03/18   Duffy Bruce, MD  promethazine (PHENERGAN) 25 MG suppository Place 1 suppository (25 mg total) rectally every 8 (eight) hours as needed for refractory nausea / vomiting. Patient not taking: Reported on 12/25/2018 08/03/18   Duffy Bruce, MD    Family History Family History  Problem Relation Age of Onset  . Colon cancer Maternal Grandfather   . Breast cancer Mother   . Thyroid disease Mother   . Stroke Maternal Grandmother   . Thyroid disease Maternal Grandmother     Social History Social History   Tobacco Use  . Smoking status: Never Smoker  . Smokeless tobacco: Never Used  Substance Use Topics  . Alcohol use: Yes  . Drug use: Not Currently     Allergies   Benadryl [diphenhydramine hcl]   Review of Systems Review of Systems Ten systems reviewed and are negative for acute change, except as noted in the HPI.    Physical Exam Updated Vital Signs BP Marland Kitchen)  161/79 (BP Location: Left Arm)   Pulse (!) 130   Temp 98.4 F (36.9 C) (Oral)   Resp 16   Ht 5\' 4"  (1.626 m)   Wt 102.1 kg   LMP 12/24/2018 (Exact Date)   SpO2 100%   BMI 38.62 kg/m   Physical Exam Vitals signs and nursing note reviewed.  Constitutional:      General: She is not in acute distress.    Appearance: She is well-developed. She is not diaphoretic.  HENT:     Head: Normocephalic.     Comments: Multiple small lacerations to the right cheek, right ear consistent with dog bite.  There is a puncture wound to the right wrist.  No active bleeding normal radial pulse, normal grip strength. Eyes:       General: No scleral icterus.    Conjunctiva/sclera: Conjunctivae normal.  Neck:     Musculoskeletal: Normal range of motion.  Cardiovascular:     Rate and Rhythm: Normal rate and regular rhythm.     Heart sounds: Normal heart sounds. No murmur. No friction rub. No gallop.   Pulmonary:     Effort: Pulmonary effort is normal. No respiratory distress.     Breath sounds: Normal breath sounds.  Abdominal:     General: Bowel sounds are normal. There is no distension.     Palpations: Abdomen is soft. There is no mass.     Tenderness: There is no abdominal tenderness. There is no guarding.  Skin:    General: Skin is warm and dry.  Neurological:     Mental Status: She is alert and oriented to person, place, and time.  Psychiatric:        Behavior: Behavior normal.      ED Treatments / Results  Labs (all labs ordered are listed, but only abnormal results are displayed) Labs Reviewed - No data to display  EKG None  Radiology No results found.  Procedures .Marland KitchenLaceration Repair Date/Time: 12/25/2018 2:41 AM Performed by: Margarita Mail, PA-C Authorized by: Margarita Mail, PA-C   Consent:    Consent obtained:  Verbal   Consent given by:  Patient   Risks discussed:  Infection, pain and poor cosmetic result   Alternatives discussed:  No treatment and delayed treatment Anesthesia (see MAR for exact dosages):    Anesthesia method:  Nerve block   Block location:  Auricular   Block needle gauge:  27 G   Block injection procedure:  Anatomic landmarks identified and incremental injection   Block outcome:  Anesthesia achieved Laceration details:    Location:  Ear   Ear location:  R ear   Length (cm):  2 Repair type:    Repair type:  Intermediate Pre-procedure details:    Preparation:  Patient was prepped and draped in usual sterile fashion Exploration:    Wound exploration: wound explored through full range of motion     Contaminated: yes   Treatment:    Area cleansed  with:  Saline and Betadine   Amount of cleaning:  Extensive   Irrigation solution:  Sterile saline Skin repair:    Repair method:  Sutures   Suture size:  4-0   Suture material:  Plain gut   Suture technique:  Simple interrupted   Number of sutures:  4 Approximation:    Approximation:  Loose Post-procedure details:    Patient tolerance of procedure:  Tolerated well, no immediate complications  .Marland KitchenLaceration Repair Date/Time: 12/25/2018 2:49 AM Performed by: Margarita Mail, PA-C Authorized by: Kenton Kingfisher,  Vernie Shanks, PA-C   Consent:    Consent obtained:  Verbal   Consent given by:  Patient   Risks discussed:  Infection, need for additional repair, pain, poor cosmetic result and poor wound healing   Alternatives discussed:  No treatment and delayed treatment Universal protocol:    Procedure explained and questions answered to patient or proxy's satisfaction: yes     Relevant documents present and verified: yes     Test results available and properly labeled: yes     Imaging studies available: yes     Required blood products, implants, devices, and special equipment available: yes     Site/side marked: yes     Immediately prior to procedure, a time out was called: yes     Patient identity confirmed:  Verbally with patient Anesthesia (see MAR for exact dosages):    Anesthesia method:  Local infiltration   Local anesthetic:  Lidocaine 1% WITH epi Laceration details:    Location:  Face   Face location:  R cheek   Length (cm):  2 Repair type:    Repair type:  Simple Treatment:    Area cleansed with:  Betadine and saline   Amount of cleaning:  Extensive Skin repair:    Repair method:  Sutures   Suture material:  Plain gut   Suture technique:  Simple interrupted   Number of sutures:  1 Approximation:    Approximation:  Loose Post-procedure details:    Patient tolerance of procedure:  Tolerated well, no immediate complications .Marland KitchenLaceration Repair Date/Time: 12/25/2018 2:50  AM Performed by: Margarita Mail, PA-C Authorized by: Margarita Mail, PA-C   Consent:    Consent obtained:  Verbal   Consent given by:  Patient   Risks discussed:  Infection, need for additional repair, pain, poor cosmetic result and poor wound healing   Alternatives discussed:  No treatment and delayed treatment Universal protocol:    Procedure explained and questions answered to patient or proxy's satisfaction: yes     Relevant documents present and verified: yes     Test results available and properly labeled: yes     Imaging studies available: yes     Required blood products, implants, devices, and special equipment available: yes     Site/side marked: yes     Immediately prior to procedure, a time out was called: yes     Patient identity confirmed:  Verbally with patient Anesthesia (see MAR for exact dosages):    Anesthesia method:  Local infiltration   Local anesthetic:  Lidocaine 1% WITH epi Laceration details:    Location:  Face   Face location:  R cheek   Length (cm):  2 Repair type:    Repair type:  Simple Pre-procedure details:    Preparation:  Patient was prepped and draped in usual sterile fashion Exploration:    Wound exploration: wound explored through full range of motion   Treatment:    Area cleansed with:  Saline and Betadine   Amount of cleaning:  Extensive Skin repair:    Repair method:  Sutures   Suture size:  3-0   Suture material:  Plain gut   Suture technique:  Simple interrupted   Number of sutures:  1 Approximation:    Approximation:  Loose Post-procedure details:    Patient tolerance of procedure:  Tolerated well, no immediate complications .Marland KitchenLaceration Repair Date/Time: 12/25/2018 2:51 AM Performed by: Margarita Mail, PA-C Authorized by: Margarita Mail, PA-C   Consent:    Consent obtained:  Verbal  Consent given by:  Patient   Risks discussed:  Infection, need for additional repair, pain, poor cosmetic result and poor wound healing    Alternatives discussed:  No treatment and delayed treatment Universal protocol:    Procedure explained and questions answered to patient or proxy's satisfaction: yes     Relevant documents present and verified: yes     Test results available and properly labeled: yes     Imaging studies available: yes     Required blood products, implants, devices, and special equipment available: yes     Site/side marked: yes     Immediately prior to procedure, a time out was called: yes     Patient identity confirmed:  Verbally with patient Anesthesia (see MAR for exact dosages):    Anesthesia method:  Local infiltration Laceration details:    Location:  Hand   Hand location:  R wrist   Length (cm):  1 Repair type:    Repair type:  Simple Pre-procedure details:    Preparation:  Patient was prepped and draped in usual sterile fashion Treatment:    Area cleansed with:  Betadine and saline   Amount of cleaning:  Extensive   Irrigation solution:  Sterile saline Skin repair:    Repair method:  Sutures   Suture size:  3-0   Suture material:  Plain gut   Suture technique:  Simple interrupted   Number of sutures:  1 Approximation:    Approximation:  Loose Post-procedure details:    Patient tolerance of procedure:  Tolerated well, no immediate complications   (including critical care time)  Medications Ordered in ED Medications  Tdap (BOOSTRIX) injection 0.5 mL (has no administration in time range)  lidocaine-EPINEPHrine (XYLOCAINE W/EPI) 2 %-1:100000 (with pres) injection 20 mL (has no administration in time range)     Initial Impression / Assessment and Plan / ED Course  I have reviewed the triage vital signs and the nursing notes.  Pertinent labs & imaging results that were available during my care of the patient were reviewed by me and considered in my medical decision making (see chart for details). Patient with dog bite to the face and hand.  The wounds were irrigated thoroughly.  She  will be started on Augmentin.  I did close gaping facial lacerations as well as the laceration to the right ear with loose approximation.  I discussed return precautions with the patient.  She understands signs of infection.  Patient appears appropriate for discharge at this time.   PDMP reviewed during this encounter.   Final Clinical Impressions(s) / ED Diagnoses   Final diagnoses:  Dog bite of face, initial encounter  Dog bite of right hand, initial encounter    ED Discharge Orders    None       Margarita Mail, PA-C 12/25/18 1610    Duffy Bruce, MD 12/25/18 [phone]

## 2018-12-26 ENCOUNTER — Emergency Department (HOSPITAL_COMMUNITY)
Admission: EM | Admit: 2018-12-26 | Discharge: 2018-12-27 | Disposition: A | Discharge status: Home / Self Care | Payer: PRIVATE HEALTH INSURANCE | Attending: Emergency Medicine | Admitting: Emergency Medicine

## 2018-12-26 ENCOUNTER — Other Ambulatory Visit: Payer: Self-pay

## 2018-12-26 ENCOUNTER — Encounter (HOSPITAL_COMMUNITY): Payer: Self-pay

## 2018-12-26 DIAGNOSIS — J45909 Unspecified asthma, uncomplicated: Secondary | ICD-10-CM | POA: Insufficient documentation

## 2018-12-26 DIAGNOSIS — R112 Nausea with vomiting, unspecified: Secondary | ICD-10-CM | POA: Insufficient documentation

## 2018-12-26 DIAGNOSIS — Z79899 Other long term (current) drug therapy: Secondary | ICD-10-CM | POA: Insufficient documentation

## 2018-12-26 DIAGNOSIS — R197 Diarrhea, unspecified: Secondary | ICD-10-CM | POA: Insufficient documentation

## 2018-12-26 NOTE — ED Triage Notes (Signed)
States for 2 days nausea/vomiting and states now feels weak and dizzy thinks she might have food poisoning.

## 2018-12-26 NOTE — ED Notes (Signed)
Pt requesting ultrasound IV 

## 2018-12-26 NOTE — ED Notes (Signed)
Pt aware that urine sample is needed.  

## 2018-12-26 NOTE — ED Notes (Signed)
Patient requesting to lay down for blood draw. Unable to collect labs at this time.

## 2018-12-27 ENCOUNTER — Other Ambulatory Visit: Payer: Self-pay

## 2018-12-27 ENCOUNTER — Emergency Department (HOSPITAL_BASED_OUTPATIENT_CLINIC_OR_DEPARTMENT_OTHER)
Admission: EM | Admit: 2018-12-27 | Discharge: 2018-12-27 | Disposition: A | Discharge status: Home / Self Care | Payer: PRIVATE HEALTH INSURANCE | Source: Home / Self Care | Attending: Emergency Medicine | Admitting: Emergency Medicine

## 2018-12-27 ENCOUNTER — Encounter (HOSPITAL_BASED_OUTPATIENT_CLINIC_OR_DEPARTMENT_OTHER): Payer: Self-pay | Admitting: *Deleted

## 2018-12-27 ENCOUNTER — Ambulatory Visit (HOSPITAL_COMMUNITY)
Admission: EM | Admit: 2018-12-27 | Discharge: 2018-12-27 | Disposition: A | Discharge status: Home / Self Care | Payer: PRIVATE HEALTH INSURANCE | Source: Home / Self Care

## 2018-12-27 DIAGNOSIS — R112 Nausea with vomiting, unspecified: Secondary | ICD-10-CM | POA: Insufficient documentation

## 2018-12-27 DIAGNOSIS — Z79899 Other long term (current) drug therapy: Secondary | ICD-10-CM | POA: Insufficient documentation

## 2018-12-27 DIAGNOSIS — R197 Diarrhea, unspecified: Secondary | ICD-10-CM | POA: Insufficient documentation

## 2018-12-27 DIAGNOSIS — J45909 Unspecified asthma, uncomplicated: Secondary | ICD-10-CM

## 2018-12-27 LAB — URINALYSIS, ROUTINE W REFLEX MICROSCOPIC
Bacteria, UA: NONE SEEN
Bilirubin Urine: NEGATIVE
Glucose, UA: NEGATIVE mg/dL
Ketones, ur: 80 mg/dL — AB
Nitrite: NEGATIVE
PH: 5 (ref 5.0–8.0)
Protein, ur: NEGATIVE mg/dL
Specific Gravity, Urine: 1.029 (ref 1.005–1.030)

## 2018-12-27 LAB — COMPREHENSIVE METABOLIC PANEL
ALBUMIN: 4.1 g/dL (ref 3.5–5.0)
ALT: 13 U/L (ref 0–44)
ALT: 14 U/L (ref 0–44)
ALT: 16 U/L (ref 0–44)
ANION GAP: 11 (ref 5–15)
AST: 12 U/L — ABNORMAL LOW (ref 15–41)
AST: 12 U/L — ABNORMAL LOW (ref 15–41)
AST: 34 U/L (ref 15–41)
Albumin: 3.8 g/dL (ref 3.5–5.0)
Albumin: 4.1 g/dL (ref 3.5–5.0)
Alkaline Phosphatase: 57 U/L (ref 38–126)
Alkaline Phosphatase: 64 U/L (ref 38–126)
Alkaline Phosphatase: 65 U/L (ref 38–126)
Anion gap: 10 (ref 5–15)
Anion gap: 11 (ref 5–15)
BILIRUBIN TOTAL: 1.7 mg/dL — AB (ref 0.3–1.2)
BUN: 11 mg/dL (ref 6–20)
BUN: 11 mg/dL (ref 6–20)
BUN: 9 mg/dL (ref 6–20)
CO2: 18 mmol/L — ABNORMAL LOW (ref 22–32)
CO2: 18 mmol/L — ABNORMAL LOW (ref 22–32)
CO2: 19 mmol/L — ABNORMAL LOW (ref 22–32)
Calcium: 8.4 mg/dL — ABNORMAL LOW (ref 8.9–10.3)
Calcium: 8.9 mg/dL (ref 8.9–10.3)
Calcium: 9.1 mg/dL (ref 8.9–10.3)
Chloride: 107 mmol/L (ref 98–111)
Chloride: 107 mmol/L (ref 98–111)
Chloride: 112 mmol/L — ABNORMAL HIGH (ref 98–111)
Creatinine, Ser: 0.64 mg/dL (ref 0.44–1.00)
Creatinine, Ser: 0.65 mg/dL (ref 0.44–1.00)
Creatinine, Ser: 0.67 mg/dL (ref 0.44–1.00)
GFR calc Af Amer: >60 mL/min (ref >60)
GFR calc Af Amer: >60 mL/min (ref >60)
GFR calc Af Amer: >60 mL/min (ref >60)
GFR calc non Af Amer: >60 mL/min (ref >60)
GFR calc non Af Amer: >60 mL/min (ref >60)
Glucose, Bld: 89 mg/dL (ref 70–99)
Glucose, Bld: 95 mg/dL (ref 70–99)
Glucose, Bld: 99 mg/dL (ref 70–99)
Potassium: 3.3 mmol/L — ABNORMAL LOW (ref 3.5–5.1)
Potassium: 3.4 mmol/L — ABNORMAL LOW (ref 3.5–5.1)
Potassium: 6.3 mmol/L (ref 3.5–5.1)
Sodium: 136 mmol/L (ref 135–145)
Sodium: 136 mmol/L (ref 135–145)
Sodium: 141 mmol/L (ref 135–145)
Total Bilirubin: 1 mg/dL (ref 0.3–1.2)
Total Bilirubin: 1 mg/dL (ref 0.3–1.2)
Total Protein: 6.6 g/dL (ref 6.5–8.1)
Total Protein: 7.5 g/dL (ref 6.5–8.1)
Total Protein: 7.7 g/dL (ref 6.5–8.1)

## 2018-12-27 LAB — I-STAT BETA HCG BLOOD, ED (MC, WL, AP ONLY): I-stat hCG, quantitative: <5 m[IU]/mL (ref <5)

## 2018-12-27 LAB — CBC
HCT: 39.6 % (ref 36.0–46.0)
Hemoglobin: 12.8 g/dL (ref 12.0–15.0)
MCH: 29.6 pg (ref 26.0–34.0)
MCHC: 32.3 g/dL (ref 30.0–36.0)
MCV: 91.5 fL (ref 80.0–100.0)
Platelets: 315 10*3/uL (ref 150–400)
RBC: 4.33 MIL/uL (ref 3.87–5.11)
RDW: 14.6 % (ref 11.5–15.5)
WBC: 11.9 10*3/uL — ABNORMAL HIGH (ref 4.0–10.5)
nRBC: 0 % (ref 0.0–0.2)

## 2018-12-27 LAB — RAPID URINE DRUG SCREEN, HOSP PERFORMED
Amphetamines: NOT DETECTED
Barbiturates: NOT DETECTED
Benzodiazepines: NOT DETECTED
Cocaine: NOT DETECTED
Opiates: NOT DETECTED
Tetrahydrocannabinol: NOT DETECTED

## 2018-12-27 LAB — LIPASE, BLOOD
LIPASE: 26 U/L (ref 11–51)
Lipase: 32 U/L (ref 11–51)

## 2018-12-27 LAB — CBC WITH DIFFERENTIAL/PLATELET
Abs Immature Granulocytes: 0.03 10*3/uL (ref 0.00–0.07)
BASOS ABS: 0.1 10*3/uL (ref 0.0–0.1)
Basophils Relative: 1 %
EOS PCT: 0 %
Eosinophils Absolute: 0 10*3/uL (ref 0.0–0.5)
HCT: 38.8 % (ref 36.0–46.0)
Hemoglobin: 12.6 g/dL (ref 12.0–15.0)
Immature Granulocytes: 0 %
Lymphocytes Relative: 14 %
Lymphs Abs: 1.7 10*3/uL (ref 0.7–4.0)
MCH: 29 pg (ref 26.0–34.0)
MCHC: 32.5 g/dL (ref 30.0–36.0)
MCV: 89.2 fL (ref 80.0–100.0)
Monocytes Absolute: 0.5 10*3/uL (ref 0.1–1.0)
Monocytes Relative: 4 %
Neutro Abs: 9.8 10*3/uL — ABNORMAL HIGH (ref 1.7–7.7)
Neutrophils Relative %: 81 %
PLATELETS: 307 10*3/uL (ref 150–400)
RBC: 4.35 MIL/uL (ref 3.87–5.11)
RDW: 14.3 % (ref 11.5–15.5)
WBC: 12.1 10*3/uL — ABNORMAL HIGH (ref 4.0–10.5)
nRBC: 0 % (ref 0.0–0.2)

## 2018-12-27 LAB — HCG, QUANTITATIVE, PREGNANCY

## 2018-12-27 MED ORDER — PROMETHAZINE HCL 25 MG/ML IJ SOLN
12.5000 mg | Freq: Once | INTRAMUSCULAR | Status: AC
  Administered 2018-12-27: 12.5 mg via INTRAVENOUS
  Filled 2018-12-27: qty 1

## 2018-12-27 MED ORDER — POTASSIUM CHLORIDE CRYS ER 20 MEQ PO TBCR
40.0000 meq | EXTENDED_RELEASE_TABLET | Freq: Once | ORAL | Status: AC
  Administered 2018-12-27: 40 meq via ORAL
  Filled 2018-12-27: qty 2

## 2018-12-27 MED ORDER — ONDANSETRON HCL 4 MG/2ML IJ SOLN
4.0000 mg | Freq: Once | INTRAMUSCULAR | Status: AC
  Administered 2018-12-27: 4 mg via INTRAVENOUS
  Filled 2018-12-27: qty 2

## 2018-12-27 MED ORDER — LORAZEPAM 2 MG/ML IJ SOLN
0.5000 mg | Freq: Once | INTRAMUSCULAR | Status: AC
  Administered 2018-12-27: 0.5 mg via INTRAVENOUS
  Filled 2018-12-27: qty 1

## 2018-12-27 MED ORDER — METOCLOPRAMIDE HCL 10 MG PO TABS: 10.0000 mg | ORAL_TABLET | Freq: Four times a day (QID) | ORAL | 0 refills | Status: DC

## 2018-12-27 MED ORDER — ACETAMINOPHEN 325 MG PO TABS
650.0000 mg | ORAL_TABLET | Freq: Once | ORAL | Status: AC
  Administered 2018-12-27: 650 mg via ORAL
  Filled 2018-12-27: qty 2

## 2018-12-27 MED ORDER — SODIUM CHLORIDE 0.9 % IV BOLUS
1000.0000 mL | Freq: Once | INTRAVENOUS | Status: AC
  Administered 2018-12-27: 1000 mL via INTRAVENOUS

## 2018-12-27 MED ORDER — KETOROLAC TROMETHAMINE 30 MG/ML IJ SOLN
30.0000 mg | Freq: Once | INTRAMUSCULAR | Status: AC
  Administered 2018-12-27: 30 mg via INTRAMUSCULAR
  Filled 2018-12-27: qty 1

## 2018-12-27 MED ORDER — METOCLOPRAMIDE HCL 5 MG/ML IJ SOLN
10.0000 mg | Freq: Once | INTRAMUSCULAR | Status: AC
  Administered 2018-12-27: 10 mg via INTRAVENOUS
  Filled 2018-12-27: qty 2

## 2018-12-27 MED ORDER — METOCLOPRAMIDE HCL 5 MG/ML IJ SOLN
20.0000 mg | Freq: Once | INTRAVENOUS | Status: DC
  Filled 2018-12-27: qty 4

## 2018-12-27 MED ORDER — LORAZEPAM 0.5 MG PO TABS: 0.5000 mg | ORAL_TABLET | Freq: Three times a day (TID) | ORAL | 0 refills | Status: DC | PRN

## 2018-12-27 MED ORDER — ALUM & MAG HYDROXIDE-SIMETH 200-200-20 MG/5ML PO SUSP
30.0000 mL | Freq: Once | ORAL | Status: AC
  Administered 2018-12-27: 30 mL via ORAL
  Filled 2018-12-27: qty 30

## 2018-12-27 MED ORDER — AMOXICILLIN-POT CLAVULANATE 875-125 MG PO TABS
1.0000 | ORAL_TABLET | Freq: Once | ORAL | Status: AC
  Administered 2018-12-27: 1 via ORAL
  Filled 2018-12-27: qty 1

## 2018-12-27 MED ORDER — PROMETHAZINE HCL 25 MG RE SUPP: 25.0000 mg | Freq: Four times a day (QID) | RECTAL | 0 refills | Status: DC | PRN

## 2018-12-27 MED ORDER — KETOROLAC TROMETHAMINE 15 MG/ML IJ SOLN
15.0000 mg | Freq: Once | INTRAMUSCULAR | Status: DC
  Filled 2018-12-27: qty 1

## 2018-12-27 NOTE — ED Provider Notes (Signed)
[facility] DEPT Provider Note   CSN: [id number] Arrival date & time: 12/26/18  2010     History   Chief Complaint Chief Complaint  Patient presents with  . Emesis    HPI Jean Woods is a 33 y.o. female with a h/o asthma who presents to the emergency department with a chief complaint of vomiting.  The patient endorses greater than 10 episodes of nonbloody, nonbilious emesis over the last 2 days with associated nausea and nonbloody diarrhea that began yesterday.  She reports subjective fever and chills.  She denies abdominal pain, back pain, dysuria, hematuria, urinary frequency or hesitancy, shortness of breath, chest pain, constipation, or vaginal pain, discharge, or itching.  States her significant other was ill with similar symptoms 2 days ago, but has resolved after 24 hours.  She is concerned that she may have eaten some undercooked chicken at [employer].  She reports that she is only voided twice in the last 24 hours because she has been unable to keep down any fluids or food.  She is also seen in the emergency department on January 2 and was started on Augmentin after she underwent several laceration repairs for dog bites to the right side of her face.  She is concerned because she has been unable to tolerate several doses of her home antibiotics and wants to avoid infection.  The history is provided by the patient. No language interpreter was used.    Past Medical History:  Diagnosis Date  . Asthma   . Chicken pox   . Depression   . Seasonal allergies     Patient Active Problem List   Diagnosis Date Noted  . Cough 03/12/2013  . Cat allergies 03/12/2013  . Medication reaction 03/12/2013  . Weight gain 12/25/2012  . Hypercholesterolemia 12/25/2012    History reviewed. No pertinent surgical history.   OB History   No obstetric history on file.      Home Medications    Prior to Admission medications   Medication Sig Start Date  End Date Taking? Authorizing Provider  albuterol (PROVENTIL HFA;VENTOLIN HFA) 108 (90 Base) MCG/ACT inhaler Inhale 1 puff into the lungs every 6 (six) hours as needed for wheezing or shortness of breath.   Yes [provider]  amoxicillin-clavulanate (AUGMENTIN) 875-125 MG tablet Take 1 tablet by mouth 2 (two) times daily. One po bid x 7 days 12/25/18  Yes Harris, Abigail, PA-C  beclomethasone (QVAR) 80 MCG/ACT inhaler Inhale 1 puff into the lungs daily.   Yes [provider]  famotidine (PEPCID) 20 MG tablet Take 1 tablet (20 mg total) by mouth 2 (two) times daily. Patient taking differently: Take 20 mg by mouth daily.  07/10/15  Yes Noemi Chapel, MD  fexofenadine (ALLEGRA) 180 MG tablet Take 180 mg by mouth daily.   Yes [provider]  ibuprofen (ADVIL,MOTRIN) 200 MG tablet Take 600 mg by mouth every 6 (six) hours as needed for headache, mild pain, moderate pain or cramping.    Yes [provider]  Triamcinolone Acetonide (NASACORT ALLERGY 24HR NA) Place 2 sprays into the nose daily.   Yes [provider]  metoCLOPramide (REGLAN) 10 MG tablet Take 1 tablet (10 mg total) by mouth every 6 (six) hours. 12/27/18   Lavina Resor A, PA-C  traMADol (ULTRAM) 50 MG tablet Take 1 tablet (50 mg total) by mouth every 6 (six) hours as needed for severe pain. 12/25/18   Margarita Mail, PA-C    Family History  Family History  Problem Relation Age of Onset  . Colon cancer Maternal Grandfather   . Breast cancer Mother   . Thyroid disease Mother   . Stroke Maternal Grandmother   . Thyroid disease Maternal Grandmother     Social History Social History   Tobacco Use  . Smoking status: Never Smoker  . Smokeless tobacco: Never Used  Substance Use Topics  . Alcohol use: Yes  . Drug use: Not Currently     Allergies   Benadryl [diphenhydramine hcl]   Review of Systems Review of Systems  Constitutional: Positive for chills and fever. Negative for activity  change.  Respiratory: Negative for shortness of breath.   Cardiovascular: Negative for chest pain.  Gastrointestinal: Positive for diarrhea, nausea and vomiting. Negative for abdominal pain.  Genitourinary: Negative for decreased urine volume, difficulty urinating, dysuria, frequency, pelvic pain, vaginal bleeding and vaginal pain.  Musculoskeletal: Negative for back pain.  Skin: Negative for rash.  Allergic/Immunologic: Negative for immunocompromised state.  Neurological: Negative for headaches.  Psychiatric/Behavioral: Negative for confusion.   Physical Exam Updated Vital Signs BP 123/83 (BP Location: Right Arm)   Pulse (!) 104   Temp 98 F (36.7 C) (Oral)   Resp 18   Ht 5\' 4"  (1.626 m)   Wt 100.8 kg   LMP 12/24/2018 (Exact Date)   SpO2 98%   BMI 38.16 kg/m   Physical Exam Vitals signs and nursing note reviewed.  Constitutional:      General: She is not in acute distress. HENT:     Head: Normocephalic.     Mouth/Throat:     Pharynx: No posterior oropharyngeal erythema.     Comments: Lips appear dry. Eyes:     General: No scleral icterus.       Right eye: No discharge.        Left eye: No discharge.     Conjunctiva/sclera: Conjunctivae normal.  Neck:     Musculoskeletal: Neck supple.  Cardiovascular:     Rate and Rhythm: Regular rhythm. Tachycardia present.     Heart sounds: No murmur. No friction rub. No gallop.   Pulmonary:     Effort: Pulmonary effort is normal. No respiratory distress.     Breath sounds: Normal breath sounds. No stridor. No wheezing, rhonchi or rales.     Comments: Lungs are clear to auscultation bilaterally. Abdominal:     General: There is no distension.     Palpations: Abdomen is soft. There is no mass.     Tenderness: There is no abdominal tenderness. There is no right CVA tenderness, left CVA tenderness, guarding or rebound.     Hernia: No hernia is present.     Comments: Abdomen is soft, nontender, nondistended.  No CVA tenderness  bilaterally.  No tenderness over McBurney's point.  Negative Murphy sign.  No rebound or guarding.  Musculoskeletal:     Right lower leg: No edema.     Left lower leg: No edema.  Skin:    General: Skin is warm.     Capillary Refill: Capillary refill takes 2 to 3 seconds.     Findings: No rash.  Neurological:     Mental Status: She is alert.  Psychiatric:        Behavior: Behavior normal.    ED Treatments / Results  Labs (all labs ordered are listed, but only abnormal results are displayed) Labs Reviewed  COMPREHENSIVE METABOLIC PANEL - Abnormal; Notable for the following components:      Result Value  Potassium 6.3 (*)    CO2 18 (*)    Total Bilirubin 1.7 (*)    All other components within normal limits  CBC - Abnormal; Notable for the following components:   WBC 11.9 (*)    All other components within normal limits  URINALYSIS, ROUTINE W REFLEX MICROSCOPIC - Abnormal; Notable for the following components:   Hgb urine dipstick MODERATE (*)    Ketones, ur 80 (*)    Leukocytes, UA SMALL (*)    All other components within normal limits  COMPREHENSIVE METABOLIC PANEL - Abnormal; Notable for the following components:   Potassium 3.3 (*)    Chloride 112 (*)    CO2 19 (*)    Calcium 8.4 (*)    AST 12 (*)    All other components within normal limits  LIPASE, BLOOD  I-STAT BETA HCG BLOOD, ED ([facility], [facility], [facility] ONLY)    EKG None  Radiology No results found.  Procedures Procedures (including critical care time)  Medications Ordered in ED Medications  sodium chloride 0.9 % bolus 1,000 mL (0 mLs Intravenous Stopped 12/27/18 0153)  ondansetron (ZOFRAN) injection 4 mg (4 mg Intravenous Given 12/27/18 0012)  sodium chloride 0.9 % bolus 1,000 mL (0 mLs Intravenous Stopped 12/27/18 0601)  metoCLOPramide (REGLAN) injection 10 mg (10 mg Intravenous Given 12/27/18 0336)     Initial Impression / Assessment and Plan / ED Course  I have reviewed the triage vital signs and the nursing  notes.  Pertinent labs & imaging results that were available during my care of the patient were reviewed by me and considered in my medical decision making (see chart for details).  33 year old female with a history of asthma presenting with nausea, vomiting, and diarrhea with subjective fever and chills for the last 48 hours.  She is only voided twice in the last 24 hours due to vomiting.  On exam, she clinically appears dehydrated.  Will order IV fluids and Zofran.  Labs are pending.  Clinical Course as of Dec 27 656  Silvestre Moment  4196 Patient recheck.  Reports she just had several episodes of dry heaving.  Reports this began after she was able to give a urine sample.  Notified by nursing staff that the patient's second metabolic panel has clotted and needs to be resent.   [MM]    Clinical Course User Index [MM] Railee Bonillas A, PA-C   Patient recheck.  No episodes of dry heaves or vomiting after Reglan.  First lab collection hemolyzed.  She does not have a surgical abdomen.  Labs are notable for ketonuria.  Mild hypokalemia of 3.3.  Labs are otherwise reassuring, but suggest hemoconcentration secondary to dehydration.  Suspect viral etiology.  She was successfully fluid challenged in the ED.  Low suspicion for pancreatitis, diverticulitis, ovarian torsion, pancreatitis, cholecystitis, appendicitis, pyelonephritis, or ectopic pregnancy.  Strict return precautions given.  She is hemodynamically stable and in no acute distress.  She is safe for discharge home with outpatient follow-up at this time.  Final Clinical Impressions(s) / ED Diagnoses   Final diagnoses:  Nausea vomiting and diarrhea    ED Discharge Orders         Ordered    metoCLOPramide (REGLAN) 10 MG tablet  Every 6 hours     12/27/18 0555           Joanne Gavel, PA-C 12/27/18 2229    Ezequiel Essex, MD 12/27/18 [phone]

## 2018-12-27 NOTE — ED Notes (Signed)
Pt states she "feels better after the nausea medication and getting fluids"

## 2018-12-27 NOTE — Discharge Instructions (Addendum)
Please take nausea medicines as prescribed.  If the nausea medications will not work then you may try taking Ativan.  If you are unable to keep fluids down then you need to return to the emergency department.   Please follow up with your primary doctor within the next 5-7 days.  If you do not have a primary care provider, information for a healthcare clinic has been provided for you to make arrangements for follow up care. Please return to the ER sooner if you have any new or worsening symptoms, or if you have any of the following symptoms:  Abdominal pain that does not go away.  You have a fever.  You keep throwing up (vomiting).  The pain is felt only in portions of the abdomen. Pain in the right side could possibly be appendicitis. In an adult, pain in the left lower portion of the abdomen could be colitis or diverticulitis.  You pass bloody or black tarry stools.  There is bright red blood in the stool.  The constipation stays for more than 4 days.  There is belly (abdominal) or rectal pain.  You do not seem to be getting better.  You have any questions or concerns.

## 2018-12-27 NOTE — ED Notes (Signed)
Pt tolerated water and crackers with no reports of emesis.

## 2018-12-27 NOTE — ED Notes (Addendum)
Pt was just discharged this morning for vomiting, pt came in stating she feels worse, spoke with Amy PA, pt needs to be seen back in the ER, spoke with patient about plan, pt agreeable, given info on shortest wait ERs. Left with family.

## 2018-12-27 NOTE — ED Notes (Signed)
Recollect CMP due to slight hemolysis

## 2018-12-27 NOTE — ED Notes (Signed)
Urine and culture sent to lab  

## 2018-12-27 NOTE — Discharge Instructions (Signed)
Thank you for allowing me to care for you today in the Emergency Department.   Take 1 tablet of Reglan every 6 hours as needed for nausea or vomiting.  Continue to drink plenty of fluids to avoid dehydration.  You can take 650 mg of Tylenol or 600 mg of ibuprofen for fever or chills.  You can also alternate between these 2 medications every 3 hours.  Continue to take your home Augmentin until you finish the entire course.  Return to the emergency department if you develop persistent vomiting despite taking Reglan, if you develop black or bloody diarrhea or stools, a high fever despite taking Tylenol and ibuprofen as described above, severe, uncontrollable abdominal pain, or other new, concerning symptoms.

## 2018-12-27 NOTE — ED Notes (Signed)
I gave the patient some graham crackers and saltine crackers with some water to drank

## 2018-12-27 NOTE — ED Notes (Signed)
Pt and husband verbalized discharge instruction and follow up care. Alert and ambulatory

## 2018-12-27 NOTE — ED Provider Notes (Addendum)
[facility] EMERGENCY DEPARTMENT Provider Note   CSN: [id number] Arrival date & time: 12/27/18  1406     History   Chief Complaint Chief Complaint  Patient presents with  . Emesis    HPI Jean Woods is a 33 y.o. female.  HPI   Pt is a 33 y/o female with a h/o asthma, depression, who presents to the ED today for evaluation of nausea and vomiting that has been ongoing for the last 3 days. States she was seen at [facility] ED last night and was given antiemetics and fluids. She was able to tolerate PO in the ED and was discharged after her sxs improved. She was given rx for antiemetics at home. She tried taking taken reglan at home and has continued vomiting today. No fevers but reports chills. Reports diarrhea after starting augmentin but this has improved. Denies urinary or vaginal complaints.   Denies abd pain, but reports some burning sensation in her abd after vomititing.   States she was vomiting prior to the dog bite and prior to initiating augmentin.   Past Medical History:  Diagnosis Date  . Asthma   . Chicken pox   . Depression   . Seasonal allergies     Patient Active Problem List   Diagnosis Date Noted  . Cough 03/12/2013  . Cat allergies 03/12/2013  . Medication reaction 03/12/2013  . Weight gain 12/25/2012  . Hypercholesterolemia 12/25/2012    History reviewed. No pertinent surgical history.   OB History   No obstetric history on file.      Home Medications    Prior to Admission medications   Medication Sig Start Date End Date Taking? Authorizing Provider  albuterol (PROVENTIL HFA;VENTOLIN HFA) 108 (90 Base) MCG/ACT inhaler Inhale 1 puff into the lungs every 6 (six) hours as needed for wheezing or shortness of breath.   Yes [provider]  amoxicillin-clavulanate (AUGMENTIN) 875-125 MG tablet Take 1 tablet by mouth 2 (two) times daily. One po bid x 7 days 12/25/18  Yes Harris, Abigail, PA-C  beclomethasone (QVAR) 80 MCG/ACT inhaler  Inhale 1 puff into the lungs daily.   Yes [provider]  famotidine (PEPCID) 20 MG tablet Take 1 tablet (20 mg total) by mouth 2 (two) times daily. Patient taking differently: Take 20 mg by mouth daily.  07/10/15  Yes Noemi Chapel, MD  fexofenadine (ALLEGRA) 180 MG tablet Take 180 mg by mouth daily.   Yes [provider]  ibuprofen (ADVIL,MOTRIN) 200 MG tablet Take 600 mg by mouth every 6 (six) hours as needed for headache, mild pain, moderate pain or cramping.    Yes [provider]  metoCLOPramide (REGLAN) 10 MG tablet Take 1 tablet (10 mg total) by mouth every 6 (six) hours. 12/27/18  Yes McDonald, Mia A, PA-C  Triamcinolone Acetonide (NASACORT ALLERGY 24HR NA) Place 2 sprays into the nose daily.   Yes [provider]  LORazepam (ATIVAN) 0.5 MG tablet Take 1 tablet (0.5 mg total) by mouth every 8 (eight) hours as needed for up to 4 doses (nausea). 12/27/18   Thedora Rings S, PA-C  promethazine (PHENERGAN) 25 MG suppository Place 1 suppository (25 mg total) rectally every 6 (six) hours as needed for nausea or vomiting. 12/27/18   Vernon Maish S, PA-C  traMADol (ULTRAM) 50 MG tablet Take 1 tablet (50 mg total) by mouth every 6 (six) hours as needed for severe pain. 12/25/18   Margarita Mail, PA-C    Family History Family History  Problem Relation Age of Onset  . Colon cancer Maternal Grandfather   . Breast cancer Mother   . Thyroid disease Mother   . Stroke Maternal Grandmother   . Thyroid disease Maternal Grandmother     Social History Social History   Tobacco Use  . Smoking status: Never Smoker  . Smokeless tobacco: Never Used  Substance Use Topics  . Alcohol use: Yes    Alcohol/week: 5.0 standard drinks    Types: 5 Cans of beer per week  . Drug use: Not Currently     Allergies   Benadryl [diphenhydramine hcl]   Review of Systems Review of Systems  Constitutional: Positive for chills. Negative for fever.  HENT: Negative for congestion.    Eyes: Negative for visual disturbance.  Respiratory: Negative for cough and shortness of breath.   Cardiovascular: Negative for chest pain.  Gastrointestinal: Positive for diarrhea, nausea and vomiting. Negative for abdominal pain, blood in stool and constipation.  Genitourinary: Negative for dysuria, frequency, hematuria and urgency.  Musculoskeletal: Negative for back pain.  Skin: Positive for wound. Negative for rash.  Neurological: Negative for dizziness, weakness, light-headedness, numbness and headaches.     Physical Exam Updated Vital Signs BP (!) 141/89   Pulse (!) 102   Temp 98.4 F (36.9 C) (Oral)   Resp 16   Ht 5\' 4"  (1.626 m)   Wt 100.7 kg   LMP 12/24/2018 (Exact Date)   SpO2 100%   BMI 38.11 kg/m   Physical Exam Vitals signs and nursing note reviewed.  Constitutional:      General: She is not in acute distress.    Appearance: She is well-developed.  HENT:     Head: Normocephalic and atraumatic.     Nose: Nose normal.     Mouth/Throat:     Mouth: Mucous membranes are dry.     Pharynx: No oropharyngeal exudate or posterior oropharyngeal erythema.  Eyes:     Conjunctiva/sclera: Conjunctivae normal.  Neck:     Musculoskeletal: Neck supple.  Cardiovascular:     Rate and Rhythm: Normal rate and regular rhythm.     Heart sounds: Normal heart sounds. No murmur.  Pulmonary:     Effort: Pulmonary effort is normal. No respiratory distress.     Breath sounds: Normal breath sounds. No stridor. No wheezing, rhonchi or rales.  Abdominal:     General: Bowel sounds are normal. There is no distension.     Palpations: Abdomen is soft.     Tenderness: There is no abdominal tenderness. There is no right CVA tenderness, left CVA tenderness, guarding or rebound.  Musculoskeletal:     Right lower leg: No edema.     Left lower leg: No edema.  Skin:    General: Skin is warm and dry.     Comments: Multiple wounds to the face and right arm from prior dog bite that appear to  be well-healing.  Neurological:     Mental Status: She is alert.      ED Treatments / Results  Labs (all labs ordered are listed, but only abnormal results are displayed) Labs Reviewed  CBC WITH DIFFERENTIAL/PLATELET - Abnormal; Notable for the following components:      Result Value   WBC 12.1 (*)    Neutro Abs 9.8 (*)    All other components within normal limits  COMPREHENSIVE METABOLIC PANEL - Abnormal; Notable for the following components:   Potassium 3.4 (*)    CO2 18 (*)    AST 12 (*)  All other components within normal limits  LIPASE, BLOOD  HCG, QUANTITATIVE, PREGNANCY  RAPID URINE DRUG SCREEN, HOSP PERFORMED  URINALYSIS, ROUTINE W REFLEX MICROSCOPIC    EKG None  Radiology No results found.  Procedures Procedures (including critical care time)  Medications Ordered in ED Medications  ketorolac (TORADOL) 15 MG/ML injection 15 mg (15 mg Intravenous Not Given 12/27/18 2110)  sodium chloride 0.9 % bolus 1,000 mL (0 mLs Intravenous Stopped 12/27/18 1822)  metoCLOPramide (REGLAN) injection 10 mg (10 mg Intravenous Given 12/27/18 1612)  alum & mag hydroxide-simeth (MAALOX/MYLANTA) 200-200-20 MG/5ML suspension 30 mL (30 mLs Oral Given 12/27/18 1553)  ondansetron (ZOFRAN) injection 4 mg (4 mg Intravenous Given 12/27/18 1721)  amoxicillin-clavulanate (AUGMENTIN) 875-125 MG per tablet 1 tablet (1 tablet Oral Given 12/27/18 1751)  potassium chloride SA (K-DUR,KLOR-CON) CR tablet 40 mEq (40 mEq Oral Given 12/27/18 1751)  promethazine (PHENERGAN) injection 12.5 mg (12.5 mg Intravenous Given 12/27/18 1740)  LORazepam (ATIVAN) injection 0.5 mg (0.5 mg Intravenous Given 12/27/18 1922)  acetaminophen (TYLENOL) tablet 650 mg (650 mg Oral Given 12/27/18 2110)  ketorolac (TORADOL) 30 MG/ML injection 30 mg (30 mg Intramuscular Given 12/27/18 2123)     Initial Impression / Assessment and Plan / ED Course  I have reviewed the triage vital signs and the nursing notes.  Pertinent labs & imaging  results that were available during my care of the patient were reviewed by me and considered in my medical decision making (see chart for details).   [place] narcotic database reviewed and there are no red flags.  Final Clinical Impressions(s) / ED Diagnoses   Final diagnoses:  Nausea vomiting and diarrhea   Presenting with symptoms of nausea and vomiting have been present for the last several days.  Was recently around her husband who had similar symptoms.  His symptoms have since resolved.  Hers have continued.  She was seen in the ED several days ago for a dog bite.  She started on Augmentin at that time.  She was later seen for nausea and vomiting yesterday.  She was able to tolerate p.o. and was discharged home with Rx for antiemetics.  She tried to take them today and was unable to keep anything down so she presented back to the ED.  She has no abdominal pain urinary or vaginal complaints.  Her abdominal exam is benign.  She is afebrile.  She slightly tachycardic. Mucous membranes are dry.  She is given IV hydration, antiemetics.  Attempted p.o. challenge and patient vomited.  Additional doses of antiemetics were given and patient was ultimately able to tolerate water, crackers and some applesauce.  She was also able to keep her Augmentin and potassium supplementation down.  Her lab work is reassuring today.  She does have a slight leukocytosis, but she does no anemia.  Her electrolytes are not grossly abnormal.  She has very mild hypokalemia.  Normal kidney and liver function.  Lipase is negative.  UA not suggestive of urinary tract infection.  Urine pregnancy test is negative.  Do not feel that imaging will benefit patient at this time given she has no abdominal tenderness.  During her visit she also complained of a headache.  She felt that her headache was due to her sinus as she has not had any of her allergy medications today.  She was given Tylenol and a dose of Toradol in the ED and her symptoms  improved.  She is a normal neuro exam.  Do not suspect any emergent cause  of her headache that would require further work-up admission.  Will give patient short course of antiemetics and short course of Ativan for home as Ativan is the only medication that has improved her vomiting.  Have advised her to follow-up with her PCP and return to the ER for new or worsening symptoms in the meantime.  She voices understanding of the plan and reasons to return to the ED.  All questions answered.   ED Discharge Orders         Ordered    promethazine (PHENERGAN) 25 MG suppository  Every 6 hours PRN,   Status:  Discontinued     12/27/18 2151    LORazepam (ATIVAN) 0.5 MG tablet  Every 8 hours PRN,   Status:  Discontinued     12/27/18 2151    LORazepam (ATIVAN) 0.5 MG tablet  Every 8 hours PRN     12/27/18 2152    promethazine (PHENERGAN) 25 MG suppository  Every 6 hours PRN     12/27/18 2152           Rodney Booze, PA-C 12/27/18 2156    Rodney Booze, PA-C 12/27/18 2225    Margette Fast, MD 12/28/18 1731

## 2018-12-27 NOTE — ED Notes (Signed)
Pt ambulated to the bathroom with a steady gait.

## 2018-12-27 NOTE — ED Notes (Signed)
I made patient aware we need a urine sample however she stated she can not go at this time

## 2018-12-27 NOTE — ED Notes (Addendum)
Lab called for critical K of 6.3. Lab called back stating it was slightly hemolyzed and advised redraw

## 2018-12-27 NOTE — ED Triage Notes (Signed)
Pt reports she was bitten on face by her own dog on Jan 2. She has had constant n/v since then. She was seen at [facility] last night and got IV fluids. Felt better until 0930 today til she woke up and has been vomiting since then. Took PO reglan without relief

## 2018-12-27 NOTE — ED Notes (Signed)
Date and time results received: 12/27/18 0039  Test: Potassium Critical Value: 6.3  Name of Provider Notified: Mia, PA

## 2020-07-06 ENCOUNTER — Ambulatory Visit (HOSPITAL_COMMUNITY)
Admission: EM | Admit: 2020-07-06 | Discharge: 2020-07-06 | Disposition: A | Discharge status: Home / Self Care | Payer: Self-pay | Attending: Internal Medicine | Admitting: Internal Medicine

## 2020-07-06 ENCOUNTER — Encounter (HOSPITAL_COMMUNITY): Payer: Self-pay

## 2020-07-06 DIAGNOSIS — H538 Other visual disturbances: Secondary | ICD-10-CM

## 2020-07-06 MED ORDER — ONDANSETRON 4 MG PO TBDP: 4.0000 mg | ORAL_TABLET | Freq: Three times a day (TID) | ORAL | 0 refills | Status: DC | PRN

## 2020-07-06 MED ORDER — HYDROXYZINE HCL 25 MG PO TABS: 25.0000 mg | ORAL_TABLET | Freq: Three times a day (TID) | ORAL | 0 refills | Status: DC | PRN

## 2020-07-06 NOTE — ED Triage Notes (Signed)
Pt presents to UC for emesis x4 days. Pt states she has new onset pressure behind left eye and blurred vision.  Pt has been treating with mucinex, benadryl, allegra, and Nasacort with out relief.

## 2020-07-06 NOTE — Discharge Instructions (Signed)
Please go to the ophthalmologist office.  Address and phone number is in your discharge paperwork.  I have spoken with the ophthalmologist and they will be expecting you.

## 2020-07-06 NOTE — ED Provider Notes (Signed)
[facility]    CSN: [id number] Arrival date & time: 07/06/20  5361      History   Chief Complaint Chief Complaint  Patient presents with  . Emesis    HPI Jean Woods is a 34 y.o. female comes to the urgent care with 4-day history of emesis, sinus pressure, pressure on the left eye and fairly sudden onset blurry vision in the left eye.  Patient says symptoms started fairly rapidly and has been persistent.  She has been using Mucinex, Benadryl, Allegra and Nasacort with no relief.  Patient denies any headaches.  No history of migraine.  She has been nauseated with few episodes of vomiting.  No diarrhea.  No sore throat, fever or chills.  No sick contacts.  No trauma to the left eye.  Patient has a history of congenital cataracts and has reading glasses as well.   Patient also has a history of panic attacks and is currently feeling anxious with nausea.  She denies any perioral numbness or tingling.  HPI  Past Medical History:  Diagnosis Date  . Asthma   . Chicken pox   . Depression   . Seasonal allergies     Patient Active Problem List   Diagnosis Date Noted  . Cough 03/12/2013  . Cat allergies 03/12/2013  . Medication reaction 03/12/2013  . Weight gain 12/25/2012  . Hypercholesterolemia 12/25/2012    History reviewed. No pertinent surgical history.  OB History   No obstetric history on file.      Home Medications    Prior to Admission medications   Medication Sig Start Date End Date Taking? Authorizing Provider  albuterol (PROVENTIL HFA;VENTOLIN HFA) 108 (90 Base) MCG/ACT inhaler Inhale 1 puff into the lungs every 6 (six) hours as needed for wheezing or shortness of breath.    [provider]  beclomethasone (QVAR) 80 MCG/ACT inhaler Inhale 1 puff into the lungs daily.    [provider]  famotidine (PEPCID) 20 MG tablet Take 1 tablet (20 mg total) by mouth 2 (two) times daily. Patient taking differently: Take 20 mg by mouth daily.   07/10/15   Noemi Chapel, MD  fexofenadine (ALLEGRA) 180 MG tablet Take 180 mg by mouth daily.    [provider]  hydrOXYzine (ATARAX/VISTARIL) 25 MG tablet Take 1 tablet (25 mg total) by mouth every 8 (eight) hours as needed for anxiety. 07/06/20   Yannick Steuber, Myrene Galas, MD  ibuprofen (ADVIL,MOTRIN) 200 MG tablet Take 600 mg by mouth every 6 (six) hours as needed for headache, mild pain, moderate pain or cramping.     [provider]  LORazepam (ATIVAN) 0.5 MG tablet Take 1 tablet (0.5 mg total) by mouth every 8 (eight) hours as needed for up to 4 doses (nausea). 12/27/18   Couture, Cortni S, PA-C  metoCLOPramide (REGLAN) 10 MG tablet Take 1 tablet (10 mg total) by mouth every 6 (six) hours. 12/27/18   McDonald, Mia A, PA-C  ondansetron (ZOFRAN ODT) 4 MG disintegrating tablet Take 1 tablet (4 mg total) by mouth every 8 (eight) hours as needed for nausea or vomiting. 07/06/20   Nicolis Boody, Myrene Galas, MD  promethazine (PHENERGAN) 25 MG suppository Place 1 suppository (25 mg total) rectally every 6 (six) hours as needed for nausea or vomiting. 12/27/18   Couture, Cortni S, PA-C  traMADol (ULTRAM) 50 MG tablet Take 1 tablet (50 mg total) by mouth every 6 (six) hours as needed for severe pain. 12/25/18   Margarita Mail, PA-C  Triamcinolone Acetonide (NASACORT ALLERGY 24HR NA) Place 2 sprays into the nose daily.    [provider]    Family History Family History  Problem Relation Age of Onset  . Colon cancer Maternal Grandfather   . Breast cancer Mother   . Thyroid disease Mother   . Stroke Maternal Grandmother   . Thyroid disease Maternal Grandmother     Social History Social History   Tobacco Use  . Smoking status: Never Smoker  . Smokeless tobacco: Never Used  Vaping Use  . Vaping Use: Never used  Substance Use Topics  . Alcohol use: Yes    Alcohol/week: 5.0 standard drinks    Types: 5 Cans of beer per week  . Drug use: Not Currently     Allergies   Patient has no  active allergies.   Review of Systems Review of Systems  Constitutional: Negative for activity change, chills and fatigue.  HENT: Positive for congestion. Negative for ear discharge, ear pain, postnasal drip and sore throat.   Eyes: Positive for visual disturbance. Negative for photophobia, pain and discharge.  Respiratory: Negative.   Gastrointestinal: Positive for nausea and vomiting.  Musculoskeletal: Negative.   Neurological: Negative for dizziness, light-headedness and headaches.     Physical Exam Triage Vital Signs ED Triage Vitals  Enc Vitals Group     BP 07/06/20 0901 140/81     Pulse Rate 07/06/20 0901 98     Resp 07/06/20 0901 16     Temp 07/06/20 0901 98.7 F (37.1 C)     Temp Source 07/06/20 0901 Oral     SpO2 07/06/20 0901 100 %     Weight --      Height --      Head Circumference --      Peak Flow --      Pain Score 07/06/20 0903 3     Pain Loc --      Pain Edu? --      Excl. in [place]? --    No data found.  Updated Vital Signs BP 140/81 (BP Location: Left Arm)   Pulse 98   Temp 98.7 F (37.1 C) (Oral)   Resp 16   LMP 07/05/2020 (Exact Date)   SpO2 100%   Visual Acuity Right Eye Distance: 20/20 Left Eye Distance: 20/25 Bilateral Distance: 20/20  Right Eye Near:   Left Eye Near:    Bilateral Near:     Physical Exam Vitals and nursing note reviewed.  Constitutional:      Appearance: Normal appearance.  HENT:     Right Ear: Tympanic membrane normal.     Left Ear: Tympanic membrane normal.     Nose: Nose normal.  Eyes:     Extraocular Movements: Extraocular movements intact.     Conjunctiva/sclera: Conjunctivae normal.     Pupils: Pupils are equal, round, and reactive to light.     Comments: No obvious abnormalities on funduscopic exam.  Cardiovascular:     Rate and Rhythm: Normal rate and regular rhythm.  Musculoskeletal:     Cervical back: Normal range of motion.      UC Treatments / Results  Labs (all labs ordered are listed, but  only abnormal results are displayed) Labs Reviewed - No data to display  EKG   Radiology No results found.  Procedures Procedures (including critical care time)  Medications Ordered in UC Medications - No data to display  Initial Impression / Assessment and Plan / UC Course  I have reviewed  the triage vital signs and the nursing notes.  Pertinent labs & imaging results that were available during my care of the patient were reviewed by me and considered in my medical decision making (see chart for details).     1.  Blurry vision in the left eye: Patient is advised to see her ophthalmologist Patient was able to secure an appointment with her ophthalmologist today.  She is committed to seeing the ophthalmologist today.  2.  Panic attack: Vistaril as needed for anxiety Zofran as needed for nausea/vomiting. Final Clinical Impressions(s) / UC Diagnoses   Final diagnoses:  Blurry vision, left eye     Discharge Instructions     Please go to the ophthalmologist office.  Address and phone number is in your discharge paperwork.  I have spoken with the ophthalmologist and they will be expecting you.   ED Prescriptions    Medication Sig Dispense Auth. Provider   hydrOXYzine (ATARAX/VISTARIL) 25 MG tablet Take 1 tablet (25 mg total) by mouth every 8 (eight) hours as needed for anxiety. 12 tablet Mattisen Pohlmann, Myrene Galas, MD   ondansetron (ZOFRAN ODT) 4 MG disintegrating tablet Take 1 tablet (4 mg total) by mouth every 8 (eight) hours as needed for nausea or vomiting. 20 tablet Jordane Hisle, Myrene Galas, MD     PDMP not reviewed this encounter.   Chase Picket, MD 07/06/20 1011

## 2021-01-17 ENCOUNTER — Ambulatory Visit: Payer: PRIVATE HEALTH INSURANCE | Admitting: Family Medicine

## 2021-02-19 DIAGNOSIS — J4530 Mild persistent asthma, uncomplicated: Secondary | ICD-10-CM | POA: Diagnosis not present

## 2021-02-19 DIAGNOSIS — J3089 Other allergic rhinitis: Secondary | ICD-10-CM | POA: Diagnosis not present

## 2021-02-19 DIAGNOSIS — L501 Idiopathic urticaria: Secondary | ICD-10-CM | POA: Diagnosis not present

## 2021-06-28 ENCOUNTER — Other Ambulatory Visit: Payer: Self-pay

## 2021-06-28 ENCOUNTER — Ambulatory Visit (INDEPENDENT_AMBULATORY_CARE_PROVIDER_SITE_OTHER): Payer: BC Managed Care – PPO | Admitting: Internal Medicine

## 2021-06-28 ENCOUNTER — Ambulatory Visit: Payer: PRIVATE HEALTH INSURANCE | Admitting: Internal Medicine

## 2021-06-28 ENCOUNTER — Encounter: Payer: Self-pay | Admitting: Internal Medicine

## 2021-06-28 DIAGNOSIS — E7800 Pure hypercholesterolemia, unspecified: Secondary | ICD-10-CM | POA: Diagnosis not present

## 2021-06-28 DIAGNOSIS — Z8349 Family history of other endocrine, nutritional and metabolic diseases: Secondary | ICD-10-CM | POA: Diagnosis not present

## 2021-06-28 DIAGNOSIS — J4540 Moderate persistent asthma, uncomplicated: Secondary | ICD-10-CM | POA: Diagnosis not present

## 2021-06-28 DIAGNOSIS — Z0000 Encounter for general adult medical examination without abnormal findings: Secondary | ICD-10-CM

## 2021-06-28 LAB — LIPID PANEL
Cholesterol: 257 mg/dL — ABNORMAL HIGH (ref 0–200)
HDL: 72.3 mg/dL (ref >39.00)
LDL Cholesterol: 170 mg/dL — ABNORMAL HIGH (ref 0–99)
NonHDL: 184.53
Total CHOL/HDL Ratio: 4
Triglycerides: 73 mg/dL (ref 0.0–149.0)
VLDL: 14.6 mg/dL (ref 0.0–40.0)

## 2021-06-28 LAB — COMPREHENSIVE METABOLIC PANEL
ALT: 12 U/L (ref 0–35)
AST: 13 U/L (ref 0–37)
Albumin: 4.5 g/dL (ref 3.5–5.2)
Alkaline Phosphatase: 72 U/L (ref 39–117)
BUN: 11 mg/dL (ref 6–23)
CO2: 27 mEq/L (ref 19–32)
Calcium: 9.5 mg/dL (ref 8.4–10.5)
Chloride: 104 mEq/L (ref 96–112)
Creatinine, Ser: 0.81 mg/dL (ref 0.40–1.20)
GFR: 92.76 mL/min (ref >60.00)
Glucose, Bld: 86 mg/dL (ref 70–99)
Potassium: 4.4 mEq/L (ref 3.5–5.1)
Sodium: 139 mEq/L (ref 135–145)
Total Bilirubin: 0.5 mg/dL (ref 0.2–1.2)
Total Protein: 7.7 g/dL (ref 6.0–8.3)

## 2021-06-28 LAB — CBC
HCT: 41.6 % (ref 36.0–46.0)
Hemoglobin: 14 g/dL (ref 12.0–15.0)
MCHC: 33.6 g/dL (ref 30.0–36.0)
MCV: 86.1 fl (ref 78.0–100.0)
Platelets: 311 10*3/uL (ref 150.0–400.0)
RBC: 4.83 Mil/uL (ref 3.87–5.11)
RDW: 14.7 % (ref 11.5–15.5)
WBC: 7.9 10*3/uL (ref 4.0–10.5)

## 2021-06-28 LAB — T4, FREE: Free T4: 0.74 ng/dL (ref 0.60–1.60)

## 2021-06-28 LAB — VITAMIN D 25 HYDROXY (VIT D DEFICIENCY, FRACTURES): VITD: 13 ng/mL — ABNORMAL LOW (ref 30.00–100.00)

## 2021-06-28 LAB — TSH: TSH: 2.12 u[IU]/mL (ref 0.35–5.50)

## 2021-06-28 LAB — VITAMIN B12: Vitamin B-12: 631 pg/mL (ref 211–911)

## 2021-06-28 LAB — HEMOGLOBIN A1C: Hgb A1c MFr Bld: 5.1 % (ref 4.6–6.5)

## 2021-06-28 NOTE — Patient Instructions (Signed)
We will check the labs today.   We will get you in with the nutritionist.

## 2021-06-28 NOTE — Progress Notes (Signed)
   Subjective:   Patient ID: Jean Woods, female    DOB: 1986/02/29, 35 y.o.   MRN: [id number]  HPI The patient is a new 35 YO female coming in for asthma (seeing asthma and allergy taking qvar daily and albuterol prn, typically before exercise and as needed, has had prednisone courses in the past, denies prior intubation for this, uses xyzal in the last month or so and this is helping decently) and allergies (taking xyzal, seeing allergy and asthma for this, under reasonable control, has 1 dog and 1 cat but not allergic to them) and weight (has tried losing weight in the past, feels diet is healthy, walking which has not helped) and family history of thyroid problems (several family members with thyroid problems, some low energy, weight loss problems, denies heat/cold intolerance).   PMH, [facility], social history reviewed and updated  Review of Systems  Constitutional:  Positive for unexpected weight change.  HENT: Negative.    Eyes: Negative.   Respiratory:  Positive for shortness of breath. Negative for cough and chest tightness.   Cardiovascular:  Negative for chest pain, palpitations and leg swelling.  Gastrointestinal:  Negative for abdominal distention, abdominal pain, constipation, diarrhea, nausea and vomiting.  Musculoskeletal: Negative.   Skin: Negative.   Neurological: Negative.   Psychiatric/Behavioral: Negative.     Objective:  Physical Exam Constitutional:      Appearance: She is well-developed. She is obese.  HENT:     Head: Normocephalic and atraumatic.  Cardiovascular:     Rate and Rhythm: Normal rate and regular rhythm.  Pulmonary:     Effort: Pulmonary effort is normal. No respiratory distress.     Breath sounds: Normal breath sounds. No wheezing or rales.  Abdominal:     General: Bowel sounds are normal. There is no distension.     Palpations: Abdomen is soft.     Tenderness: There is no abdominal tenderness. There is no rebound.  Musculoskeletal:     Cervical  back: Normal range of motion.  Skin:    General: Skin is warm and dry.  Neurological:     Mental Status: She is alert and oriented to person, place, and time.     Coordination: Coordination normal.    Vitals:   06/28/21 0906  BP: 122/82  Pulse: (!) 102  Resp: 18  Temp: 99.2 F (37.3 C)  TempSrc: Oral  SpO2: 99%  Weight: 255 lb (115.7 kg)  Height: 5' 4.17" (1.63 m)    This visit occurred during the SARS-CoV-2 public health emergency.  Safety protocols were in place, including screening questions prior to the visit, additional usage of staff PPE, and extensive cleaning of exam room while observing appropriate contact time as indicated for disinfecting solutions.   Assessment & Plan:

## 2021-06-29 DIAGNOSIS — J45909 Unspecified asthma, uncomplicated: Secondary | ICD-10-CM | POA: Insufficient documentation

## 2021-06-29 DIAGNOSIS — Z8349 Family history of other endocrine, nutritional and metabolic diseases: Secondary | ICD-10-CM | POA: Insufficient documentation

## 2021-06-29 NOTE — Assessment & Plan Note (Signed)
Checking lipid panel, not on meds.

## 2021-06-29 NOTE — Assessment & Plan Note (Signed)
Taking qvar daily and albuterol with exercise and as needed.

## 2021-06-29 NOTE — Assessment & Plan Note (Signed)
Checking TSH and free T4.

## 2021-06-29 NOTE — Assessment & Plan Note (Signed)
Checking labs for both complications and contributors of weight gain including HgA1c, TSH, vitamin D and B12. Referral to nutritionist.

## 2021-07-01 ENCOUNTER — Encounter: Payer: Self-pay | Admitting: Internal Medicine

## 2021-07-03 NOTE — Progress Notes (Signed)
LVM

## 2021-09-24 ENCOUNTER — Encounter: Payer: Self-pay | Admitting: Internal Medicine

## 2021-09-24 ENCOUNTER — Ambulatory Visit (INDEPENDENT_AMBULATORY_CARE_PROVIDER_SITE_OTHER): Payer: BC Managed Care – PPO | Admitting: Internal Medicine

## 2021-09-24 ENCOUNTER — Emergency Department (HOSPITAL_BASED_OUTPATIENT_CLINIC_OR_DEPARTMENT_OTHER): Payer: BC Managed Care – PPO

## 2021-09-24 ENCOUNTER — Other Ambulatory Visit: Payer: Self-pay

## 2021-09-24 ENCOUNTER — Inpatient Hospital Stay (HOSPITAL_BASED_OUTPATIENT_CLINIC_OR_DEPARTMENT_OTHER)
Admission: EM | Admit: 2021-09-24 | Discharge: 2021-09-29 | DRG: 059 | Disposition: A | Discharge status: Home / Self Care | Payer: BC Managed Care – PPO | Attending: Internal Medicine | Admitting: Internal Medicine

## 2021-09-24 ENCOUNTER — Encounter (HOSPITAL_BASED_OUTPATIENT_CLINIC_OR_DEPARTMENT_OTHER): Payer: Self-pay

## 2021-09-24 DIAGNOSIS — R202 Paresthesia of skin: Secondary | ICD-10-CM

## 2021-09-24 DIAGNOSIS — Z7951 Long term (current) use of inhaled steroids: Secondary | ICD-10-CM | POA: Diagnosis not present

## 2021-09-24 DIAGNOSIS — R262 Difficulty in walking, not elsewhere classified: Secondary | ICD-10-CM | POA: Diagnosis not present

## 2021-09-24 DIAGNOSIS — G834 Cauda equina syndrome: Secondary | ICD-10-CM | POA: Diagnosis not present

## 2021-09-24 DIAGNOSIS — F32A Depression, unspecified: Secondary | ICD-10-CM | POA: Diagnosis present

## 2021-09-24 DIAGNOSIS — Q120 Congenital cataract: Secondary | ICD-10-CM | POA: Diagnosis not present

## 2021-09-24 DIAGNOSIS — Z20822 Contact with and (suspected) exposure to covid-19: Secondary | ICD-10-CM | POA: Diagnosis not present

## 2021-09-24 DIAGNOSIS — G379 Demyelinating disease of central nervous system, unspecified: Secondary | ICD-10-CM

## 2021-09-24 DIAGNOSIS — J45909 Unspecified asthma, uncomplicated: Secondary | ICD-10-CM | POA: Diagnosis not present

## 2021-09-24 DIAGNOSIS — R200 Anesthesia of skin: Secondary | ICD-10-CM | POA: Diagnosis not present

## 2021-09-24 DIAGNOSIS — G35 Multiple sclerosis: Principal | ICD-10-CM | POA: Diagnosis present

## 2021-09-24 DIAGNOSIS — Z79899 Other long term (current) drug therapy: Secondary | ICD-10-CM

## 2021-09-24 DIAGNOSIS — M48061 Spinal stenosis, lumbar region without neurogenic claudication: Secondary | ICD-10-CM | POA: Diagnosis not present

## 2021-09-24 DIAGNOSIS — R030 Elevated blood-pressure reading, without diagnosis of hypertension: Secondary | ICD-10-CM | POA: Diagnosis not present

## 2021-09-24 DIAGNOSIS — M5126 Other intervertebral disc displacement, lumbar region: Secondary | ICD-10-CM | POA: Diagnosis not present

## 2021-09-24 DIAGNOSIS — Z6841 Body Mass Index (BMI) 40.0 and over, adult: Secondary | ICD-10-CM | POA: Diagnosis not present

## 2021-09-24 DIAGNOSIS — F419 Anxiety disorder, unspecified: Secondary | ICD-10-CM | POA: Insufficient documentation

## 2021-09-24 DIAGNOSIS — G9389 Other specified disorders of brain: Secondary | ICD-10-CM | POA: Diagnosis not present

## 2021-09-24 LAB — COMPREHENSIVE METABOLIC PANEL
ALT: 14 U/L (ref 0–44)
AST: 14 U/L — ABNORMAL LOW (ref 15–41)
Albumin: 4.6 g/dL (ref 3.5–5.0)
Alkaline Phosphatase: 84 U/L (ref 38–126)
Anion gap: 13 (ref 5–15)
BUN: 10 mg/dL (ref 6–20)
CO2: 22 mmol/L (ref 22–32)
Calcium: 9.9 mg/dL (ref 8.9–10.3)
Chloride: 103 mmol/L (ref 98–111)
Creatinine, Ser: 0.66 mg/dL (ref 0.44–1.00)
GFR, Estimated: >60 mL/min (ref >60)
Glucose, Bld: 102 mg/dL — ABNORMAL HIGH (ref 70–99)
Potassium: 3.9 mmol/L (ref 3.5–5.1)
Sodium: 138 mmol/L (ref 135–145)
Total Bilirubin: 0.6 mg/dL (ref 0.3–1.2)
Total Protein: 8.1 g/dL (ref 6.5–8.1)

## 2021-09-24 LAB — CBC WITH DIFFERENTIAL/PLATELET
Abs Immature Granulocytes: 0.04 10*3/uL (ref 0.00–0.07)
Basophils Absolute: 0.1 10*3/uL (ref 0.0–0.1)
Basophils Relative: 1 %
Eosinophils Absolute: 0 10*3/uL (ref 0.0–0.5)
Eosinophils Relative: 0 %
HCT: 42.1 % (ref 36.0–46.0)
Hemoglobin: 14.1 g/dL (ref 12.0–15.0)
Immature Granulocytes: 0 %
Lymphocytes Relative: 9 %
Lymphs Abs: 1.2 10*3/uL (ref 0.7–4.0)
MCH: 28.8 pg (ref 26.0–34.0)
MCHC: 33.5 g/dL (ref 30.0–36.0)
MCV: 85.9 fL (ref 80.0–100.0)
Monocytes Absolute: 0.4 10*3/uL (ref 0.1–1.0)
Monocytes Relative: 3 %
Neutro Abs: 10.9 10*3/uL — ABNORMAL HIGH (ref 1.7–7.7)
Neutrophils Relative %: 87 %
Platelets: 406 10*3/uL — ABNORMAL HIGH (ref 150–400)
RBC: 4.9 MIL/uL (ref 3.87–5.11)
RDW: 14.6 % (ref 11.5–15.5)
WBC: 12.6 10*3/uL — ABNORMAL HIGH (ref 4.0–10.5)
nRBC: 0 % (ref 0.0–0.2)

## 2021-09-24 LAB — HCG, SERUM, QUALITATIVE: Preg, Serum: NEGATIVE

## 2021-09-24 IMAGING — CT CT HEAD W/O CM
4 series · 17 of 47 positions shown, 19 images · non-contrast
Comparison: None.

CLINICAL DATA: Numbness along the buttocks tingling in the feet and
thighs.

EXAM:
CT HEAD WITHOUT CONTRAST
TECHNIQUE: Contiguous axial images were obtained from the base of the skull
through the vertex without intravenous contrast.

[Series 2: head wo · axial · 0.46mm/px · z∈[-1,+114]mm · 7 of 31 slices shown, 9 images]
[im 4/31  brain]
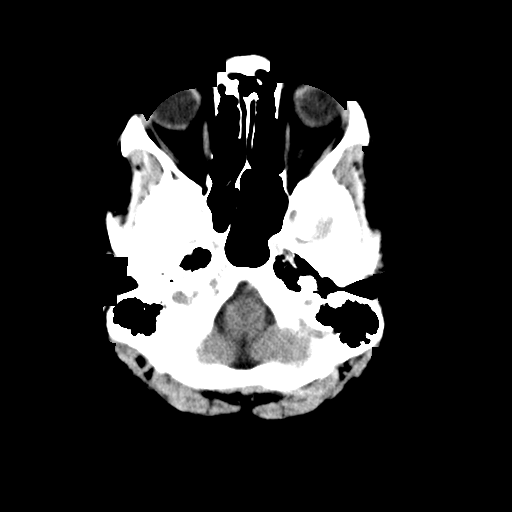
[im 4/31  bone]
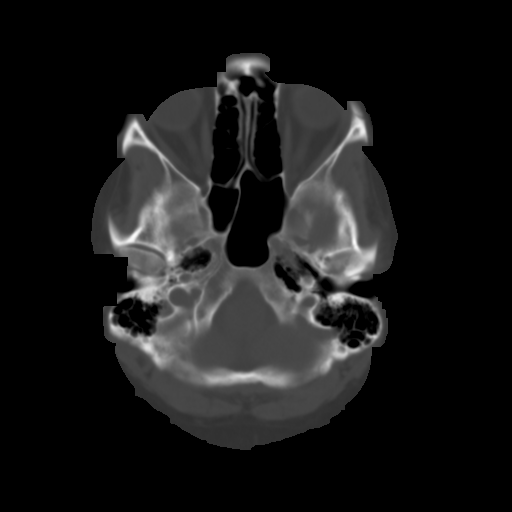
[im 8/31  brain]
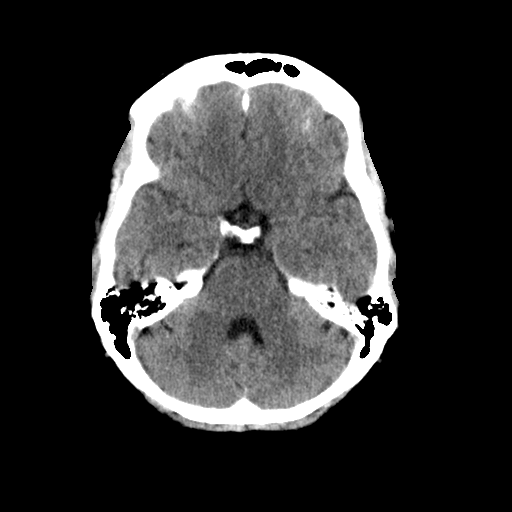
[im 12/31  brain]
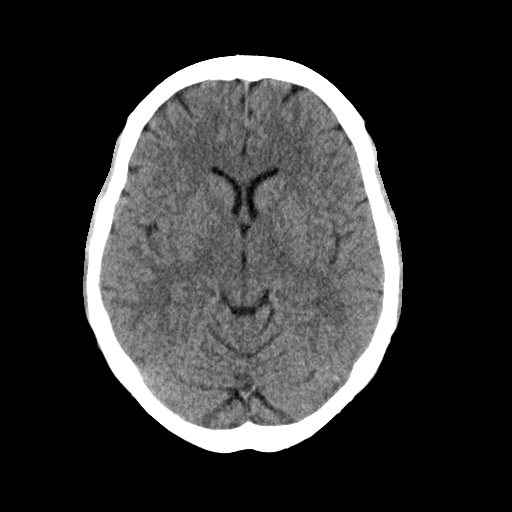
[im 16/31  brain]
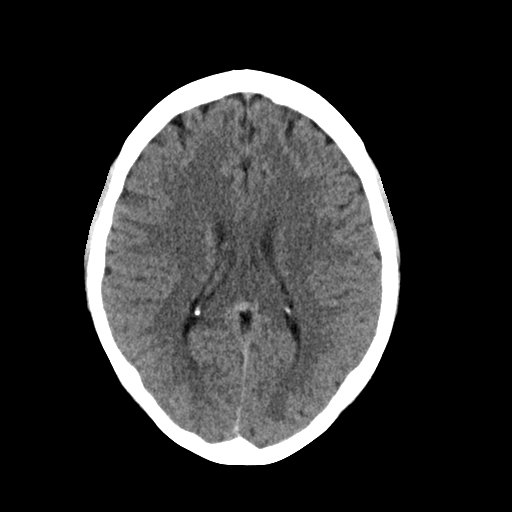
[im 19/31  brain]
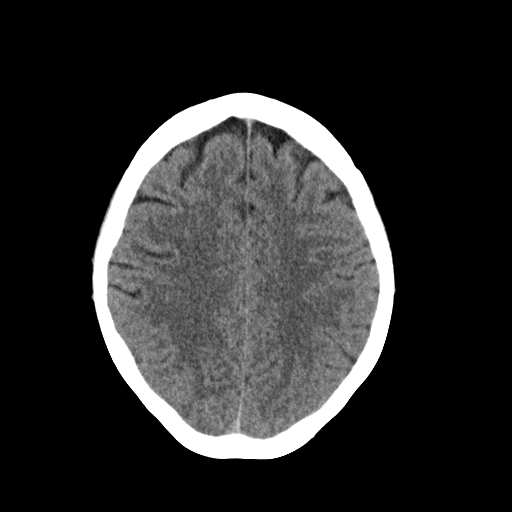
[im 19/31  bone]
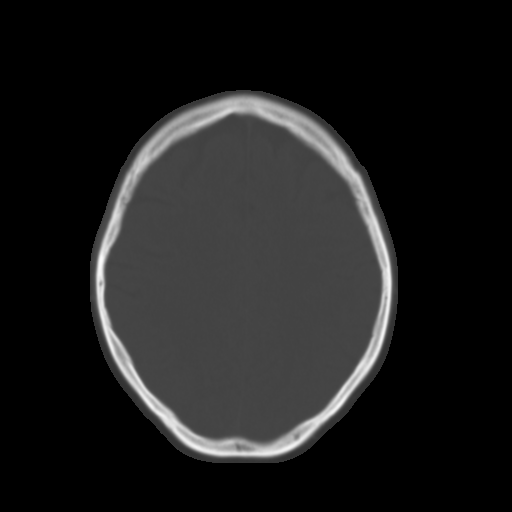
[im 23/31  brain]
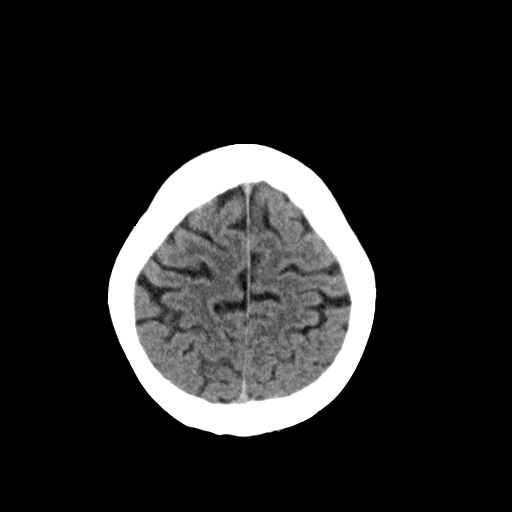
[im 27/31  brain]
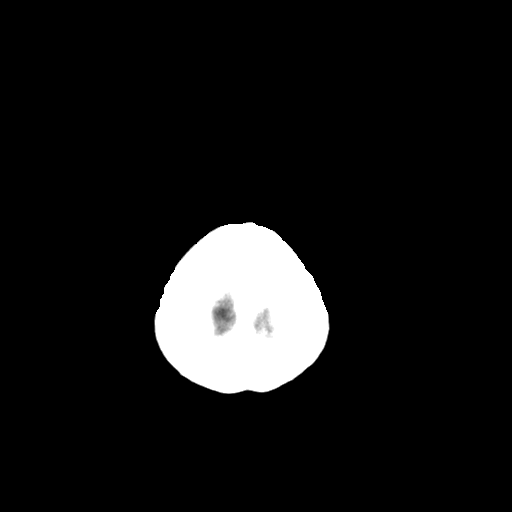

[Series 4: head bone · axial · 0.46mm/px · z∈[-2,+52]mm · 4 of 77 slices shown]
[im 8/77  bone]
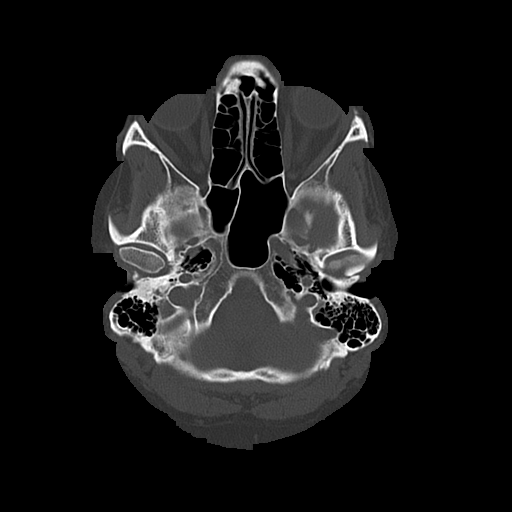
[im 16/77  bone]
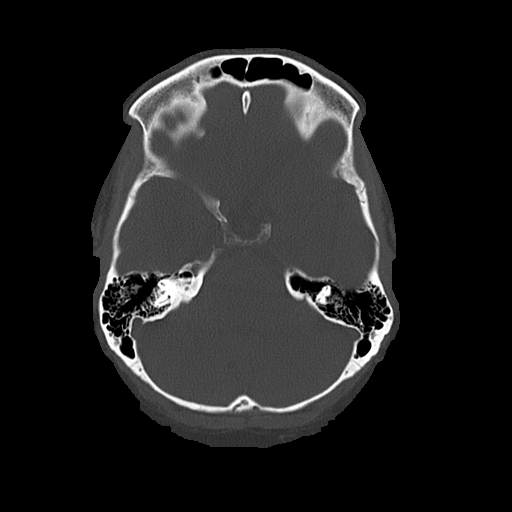
[im 23/77  bone]
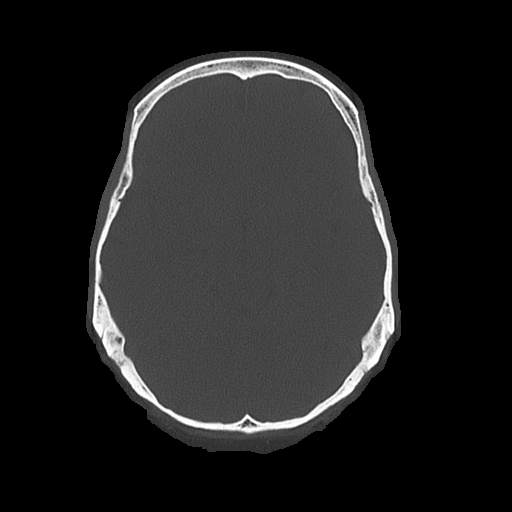
[im 35/77  bone]
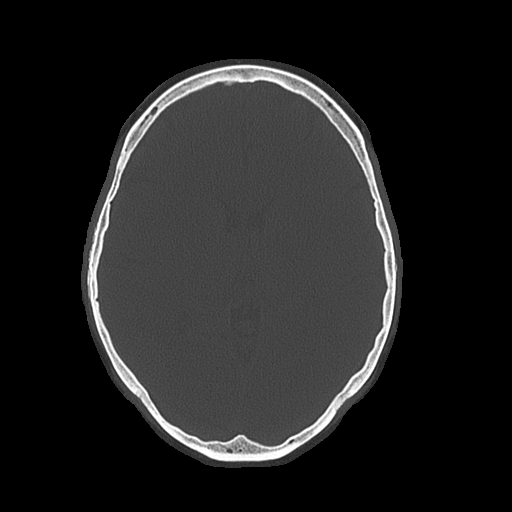

[Series 5: coronal soft · coronal · 0.35mm/px · 3 of 68 slices shown]
[im 23/68  brain]
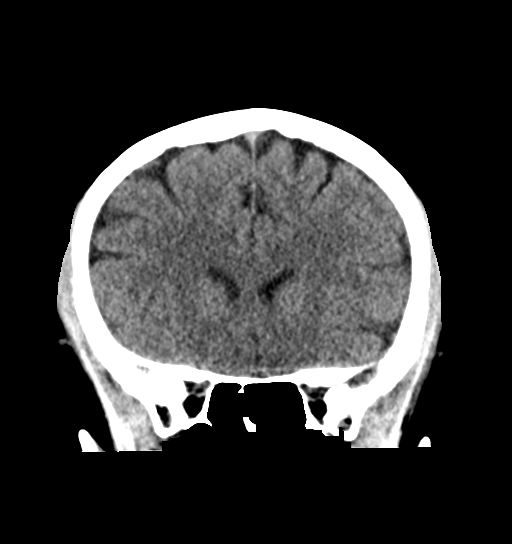
[im 30/68  brain]
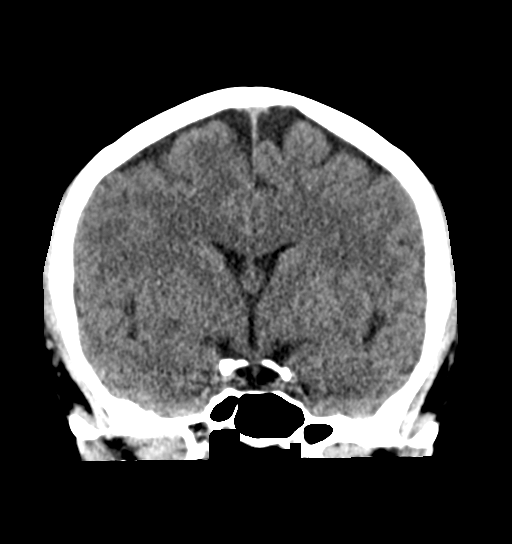
[im 38/68  brain]
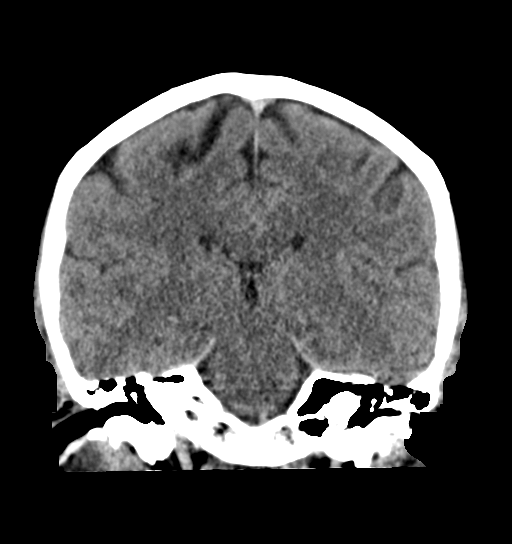

[Series 6: sagittal soft · sagittal · 0.36mm/px · 3 of 60 slices shown]
[im 20/60  brain]
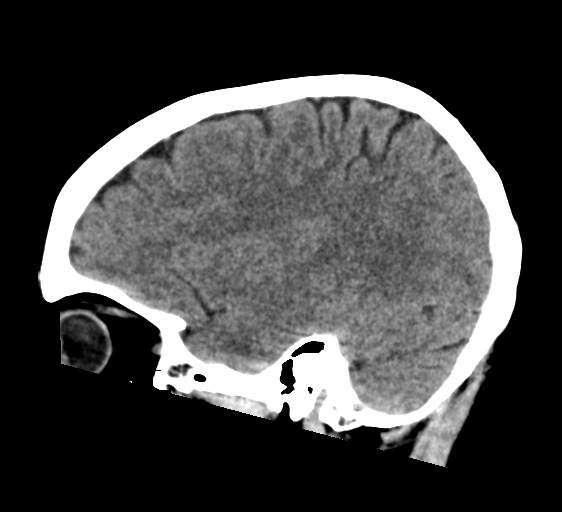
[im 30/60  brain]
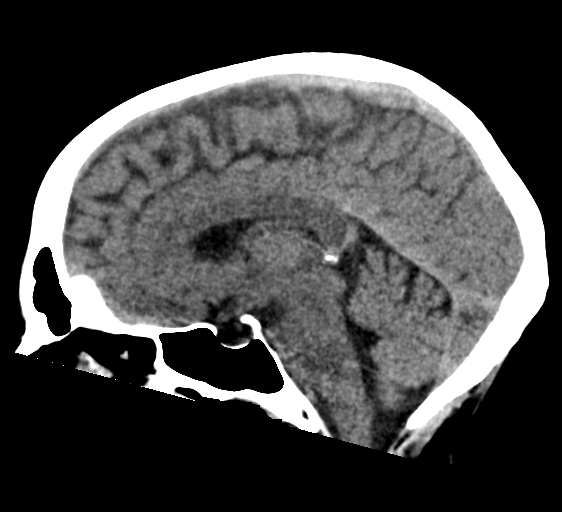
[im 40/60  brain]
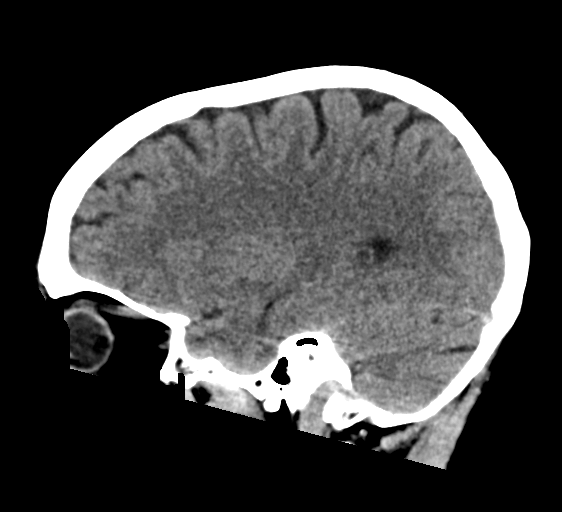

[17 of 47 positions shown; findings below may reference images not displayed]

FINDINGS: Brain: The brainstem, cerebellum, cerebral peduncles, thalami, basal
ganglia, basilar cisterns, and ventricular system appear within
normal limits. No intracranial hemorrhage, mass lesion, or acute
CVA.

Vascular: Unremarkable

Skull: Unremarkable

Sinuses/Orbits: Unremarkable

Other: No supplemental non-categorized findings.
IMPRESSION: 1. No significant abnormality is observed.

## 2021-09-24 IMAGING — CT CT L SPINE W/O CM
3 series · 13 of 33 positions shown, 16 images · non-contrast
Comparison: CT abdomen [DATE]

CLINICAL DATA: Numbness along the buttocks and tingling sensation
along the feet and thighs.

EXAM:
CT LUMBAR SPINE WITHOUT CONTRAST
TECHNIQUE: Multidetector CT imaging of the lumbar spine was performed without
intravenous contrast administration. Multiplanar CT image
reconstructions were also generated.

[Series 5: l-spine wo soft tissue · axial · 0.33mm/px · z∈[-565,-401]mm · 5 of 120 slices shown, 7 images]
[im 19/120  soft-tissue]
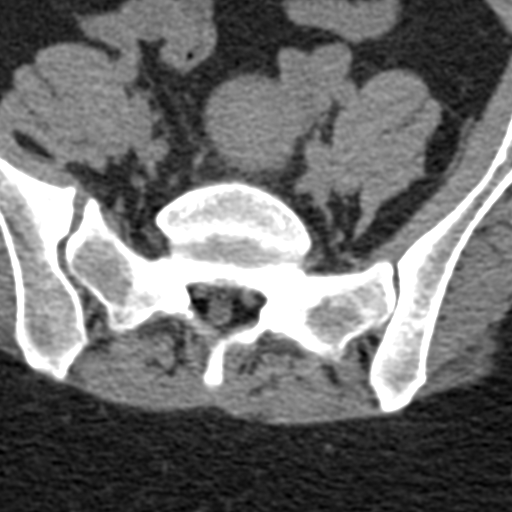
[im 19/120  bone]
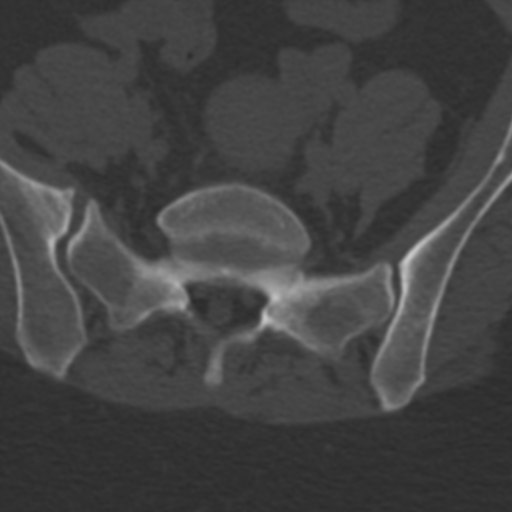
[im 37/120  bone]
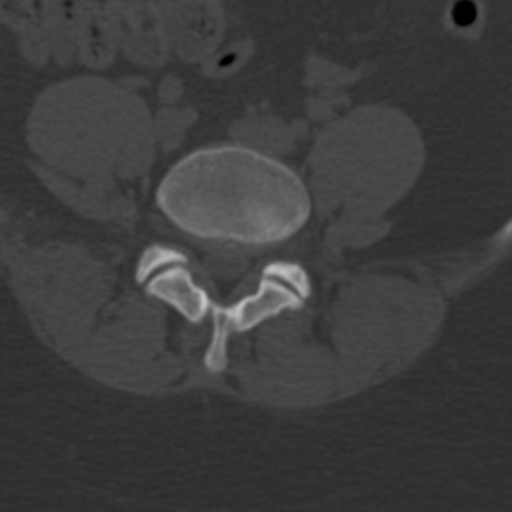
[im 65/120  bone]
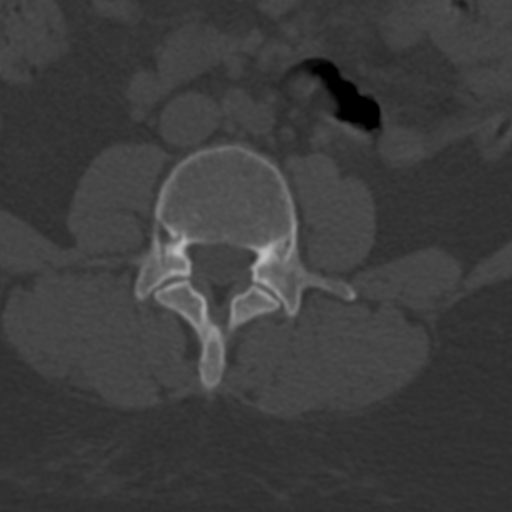
[im 83/120  bone]
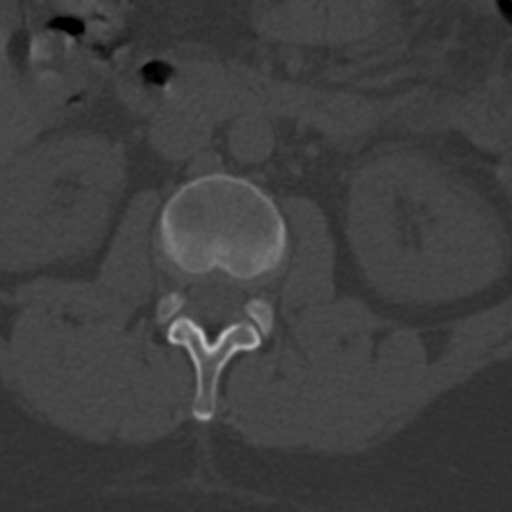
[im 101/120  soft-tissue]
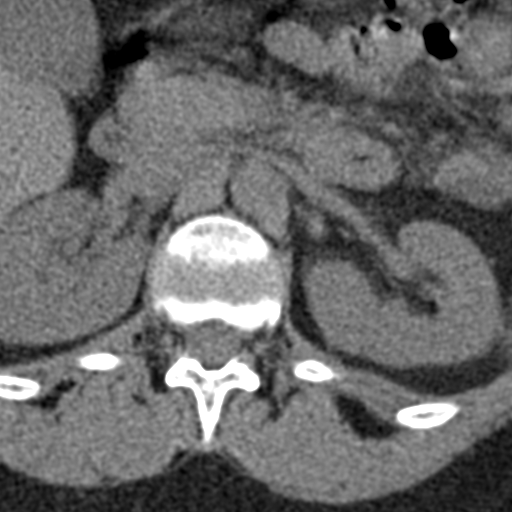
[im 101/120  bone]
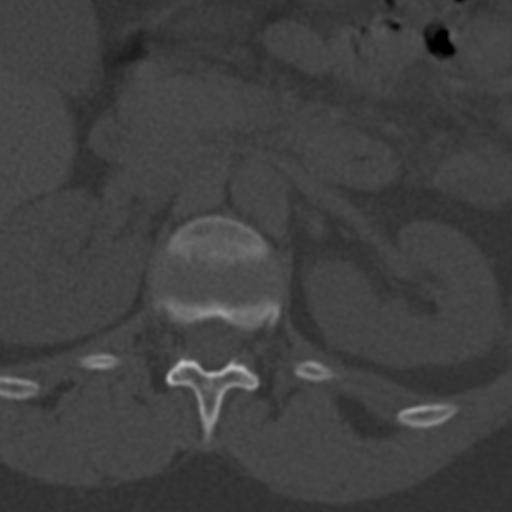

[Series 7: coronal bone · coronal · 0.35mm/px · 3 of 82 slices shown]
[im 17/82  bone]
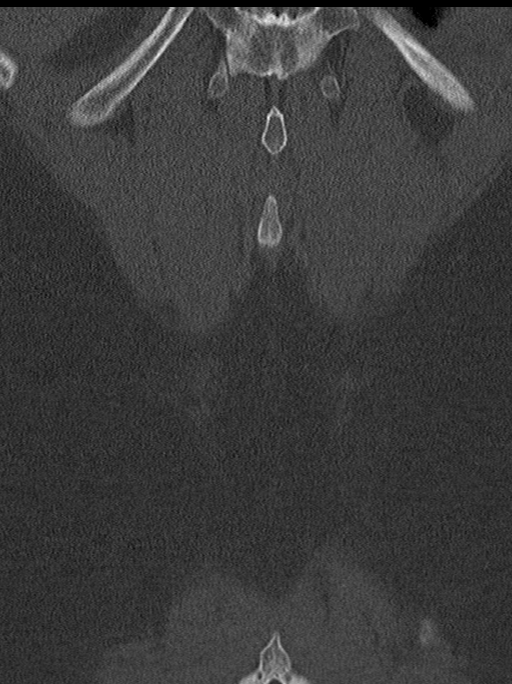
[im 33/82  bone]
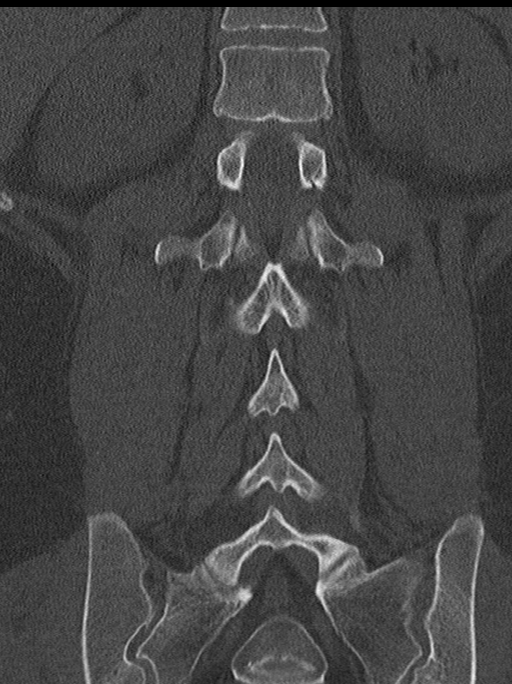
[im 49/82  bone]
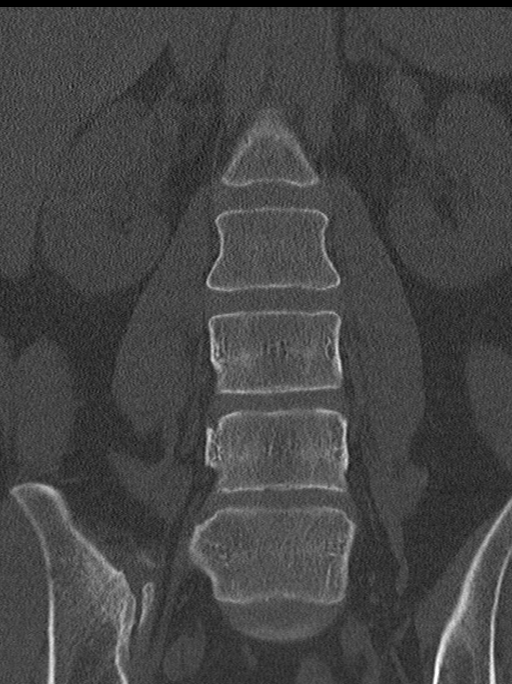

[Series 8: sagittal bone · sagittal · 0.35mm/px · 5 of 71 slices shown, 6 images]
[im 24/71  bone]
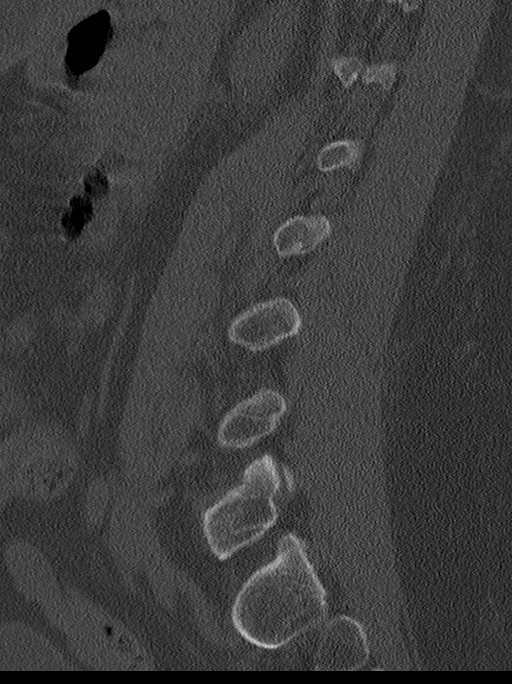
[im 30/71  bone]
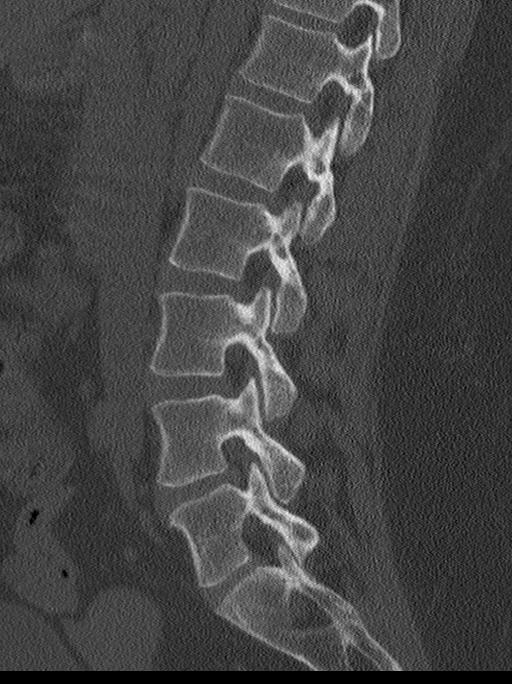
[im 36/71  soft-tissue]
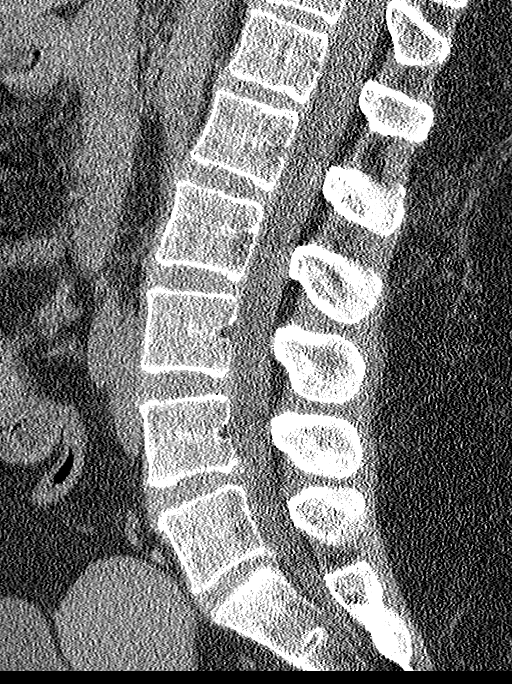
[im 36/71  bone]
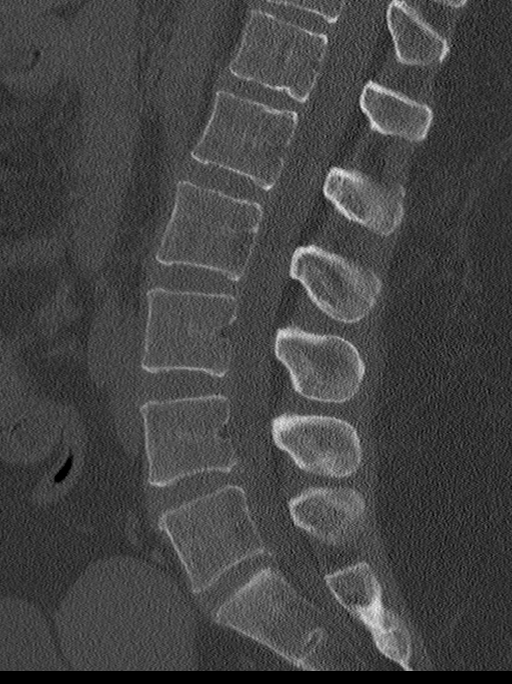
[im 41/71  bone]
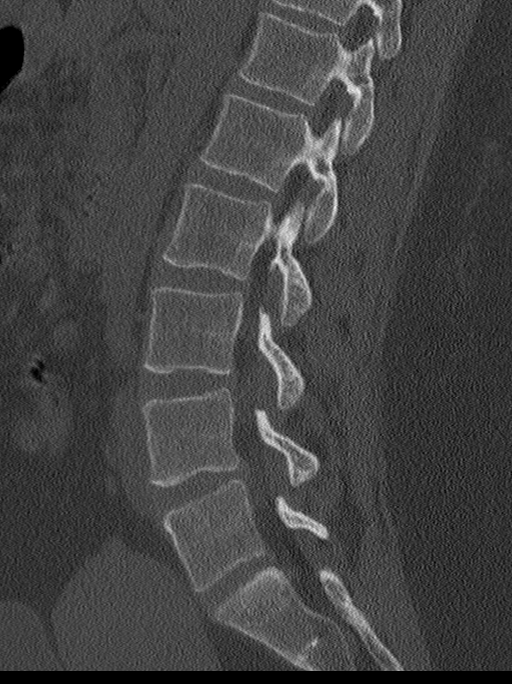
[im 47/71  bone]
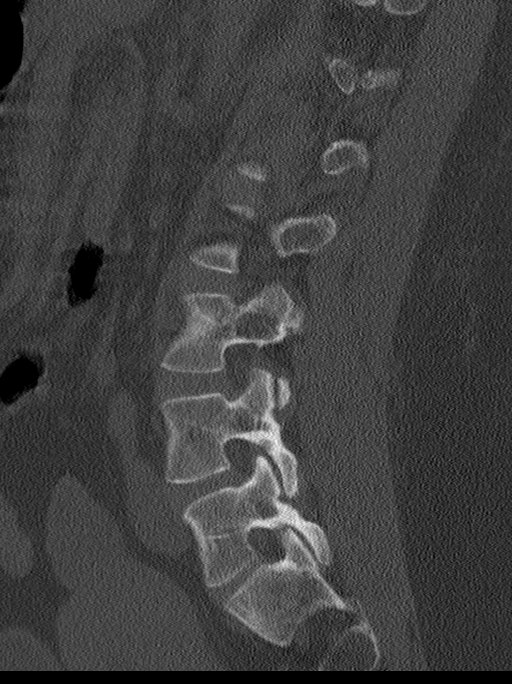

[13 of 33 positions shown; findings below may reference images not displayed]

FINDINGS: Segmentation: The lowest lumbar type non-rib-bearing vertebra is
labeled as L5.

Alignment: No vertebral subluxation is observed.

Vertebrae: The broad right transverse process of L5 mildly pseudo
articulates with the sacrum. No fracture or acute bony abnormality
is observed.

Paraspinal and other soft tissues: We partially image the previously
seen cyst along the falciform ligament in the liver.

Disc levels: No significant findings above the L4-5 level.

L4-5: Substantial central narrowing of the thecal sac with AP
diameter of the thecal sac potentially narrowed to about 0.6 cm due
to a central disc protrusion. Roughly similar to the [DATE]
exam.

L5-S1: No overt impingement, small central disc protrusion. Similar
to the [DATE] exam.
IMPRESSION: 1. There is potentially substantial central narrowing of the thecal
sac at the L4-5 level primarily due to a broad central disc
protrusion, although this is roughly similar to the appearance on
[DATE].
2. No overt impingement at L5-S1 although there is probably a small
central disc protrusion at this level as well.

## 2021-09-24 MED ORDER — SODIUM CHLORIDE 0.9 % IV BOLUS
500.0000 mL | Freq: Once | INTRAVENOUS | Status: AC
  Administered 2021-09-25: 500 mL via INTRAVENOUS

## 2021-09-24 MED ORDER — DIPHENHYDRAMINE HCL 50 MG/ML IJ SOLN
25.0000 mg | Freq: Once | INTRAMUSCULAR | Status: AC
  Administered 2021-09-25: 25 mg via INTRAVENOUS
  Filled 2021-09-24: qty 1

## 2021-09-24 MED ORDER — PROCHLORPERAZINE EDISYLATE 10 MG/2ML IJ SOLN
10.0000 mg | Freq: Once | INTRAMUSCULAR | Status: AC
  Administered 2021-09-25: 10 mg via INTRAVENOUS
  Filled 2021-09-24: qty 2

## 2021-09-24 NOTE — Assessment & Plan Note (Signed)
Mild to mod, likely situational, doubt the primary cause of symptoms

## 2021-09-24 NOTE — ED Triage Notes (Signed)
Patient here POV from Home with Numbness since 09/21/2021.   Patient stated it began while Ambulating and has worsened since. Patient has Numbness to Bilateral Buttocks and Tingling to Bilateral Feet and Thighs.   Ambulatory. NAD Noted during Triage. A&Ox4. GCS 15. No Neurological Deficits noted.

## 2021-09-24 NOTE — ED Provider Notes (Signed)
[place] [facility] EMERGENCY DEPT Provider Note   CSN: [id number] Arrival date & time: 09/24/21  1546     History Chief Complaint  Patient presents with   Numbness    KALEI MEDA is a 35 y.o. female.  Patient with the acute onset of sort of glovelike numbness to both legs on September 29 while walking.  Also felt as if the thighs were thick and there was fluid in them.  But no swelling at the ankles.  States that she has some difficulty walking.  She was at her primary care office today.  And they noted that she had an abnormal gait.  Patient claims that walking in here that she had difficulty walking.  Patient denies any urinary incontinence or fecal incontinence.  Patient denies any back pain or any leg pain.  No chest pain no shortness of breath no upper extremity numbness or weakness no visual changes no speech problems no headache.  No fevers.      Past Medical History:  Diagnosis Date   Asthma    Chicken pox    Depression    Seasonal allergies     Patient Active Problem List   Diagnosis Date Noted   Paresthesia of both legs 09/24/2021   Elevated blood-pressure reading without diagnosis of hypertension 09/24/2021   Anxiety 09/24/2021   Asthma 06/29/2021   Family history of thyroid disease 06/29/2021   Cat allergies 03/12/2013   Morbid obesity ([place]) 12/25/2012   Hypercholesterolemia 12/25/2012    History reviewed. No pertinent surgical history.   OB History   No obstetric history on file.     Family History  Problem Relation Age of Onset   Colon cancer Maternal Grandfather    Breast cancer Mother    Thyroid disease Mother    Stroke Maternal Grandmother    Thyroid disease Maternal Grandmother     Social History   Tobacco Use   Smoking status: Never   Smokeless tobacco: Never  Vaping Use   Vaping Use: Never used  Substance Use Topics   Alcohol use: Yes    Alcohol/week: 5.0 standard drinks    Types: 5 Cans of beer per week   Drug use:  Not Currently    Home Medications Prior to Admission medications   Medication Sig Start Date End Date Taking? Authorizing Provider  albuterol (PROVENTIL HFA;VENTOLIN HFA) 108 (90 Base) MCG/ACT inhaler Inhale 1 puff into the lungs every 6 (six) hours as needed for wheezing or shortness of breath.    [provider]  beclomethasone (QVAR) 80 MCG/ACT inhaler Inhale 1 puff into the lungs daily.    [provider]  famotidine (PEPCID) 20 MG tablet Take 1 tablet (20 mg total) by mouth 2 (two) times daily. Patient taking differently: Take 20 mg by mouth daily. 07/10/15   Noemi Chapel, MD  levocetirizine (XYZAL) 5 MG tablet Take 5 mg by mouth every evening. 06/19/21   [provider]  Olopatadine HCl 0.6 % SOLN Place 2 sprays into both nostrils daily. 05/17/21   [provider]    Allergies    Patient has no known allergies.  Review of Systems   Review of Systems  Constitutional:  Negative for chills and fever.  HENT:  Negative for ear pain and sore throat.   Eyes:  Negative for pain and visual disturbance.  Respiratory:  Negative for cough and shortness of breath.   Cardiovascular:  Negative for chest pain and palpitations.  Gastrointestinal:  Negative for abdominal pain  and vomiting.  Genitourinary:  Positive for difficulty urinating. Negative for dysuria and hematuria.  Musculoskeletal:  Negative for arthralgias and back pain.  Skin:  Negative for color change and rash.  Neurological:  Positive for weakness and numbness. Negative for dizziness, seizures, syncope, facial asymmetry, speech difficulty and headaches.  All other systems reviewed and are negative.  Physical Exam Updated Vital Signs BP 138/90   Pulse (!) 102   Temp 99.1 F (37.3 C) (Oral)   Resp 18   SpO2 100%   Physical Exam Vitals and nursing note reviewed.  Constitutional:      General: She is not in acute distress.    Appearance: Normal appearance. She is well-developed.  HENT:      Head: Normocephalic and atraumatic.  Eyes:     Extraocular Movements: Extraocular movements intact.     Conjunctiva/sclera: Conjunctivae normal.     Pupils: Pupils are equal, round, and reactive to light.  Cardiovascular:     Rate and Rhythm: Normal rate and regular rhythm.     Heart sounds: No murmur heard. Pulmonary:     Effort: Pulmonary effort is normal. No respiratory distress.     Breath sounds: Normal breath sounds.  Abdominal:     Palpations: Abdomen is soft.     Tenderness: There is no abdominal tenderness.  Genitourinary:    Comments: Rectal tone may be a little weak.  Difficult to tell for sure.  Initially seem to be normal.  But then seemed to weaken. Musculoskeletal:        General: No swelling, tenderness or deformity.     Cervical back: Normal range of motion and neck supple.     Comments: Back is nontender thoracic and lumbar to palpation no erythema.  No sacral erythema no sacral tenderness. No Rash.  Excellent range of motion of both lower extremities.  Motor strength seems to be intact.  Dorsalis pedis pulse 1+ bilaterally.  Skin:    General: Skin is warm and dry.     Capillary Refill: Capillary refill takes less than 2 seconds.  Neurological:     Mental Status: She is alert.     Sensory: Sensory deficit present.     Motor: No weakness.     Comments: Lateral lower extremities with good strength in the toes upward and downward.  Good movement at the knees and thighs.  Patient with straight leg raise able to hold her leg up.  No pain associated with that.  Subjective sensory deficit anterior posterior part of the both legs.  Upper extremities normal.     ED Results / Procedures / Treatments   Labs (all labs ordered are listed, but only abnormal results are displayed) Labs Reviewed  COMPREHENSIVE METABOLIC PANEL - Abnormal; Notable for the following components:      Result Value   Glucose, Bld 102 (*)    AST 14 (*)    All other components within normal  limits  CBC WITH DIFFERENTIAL/PLATELET - Abnormal; Notable for the following components:   WBC 12.6 (*)    Platelets 406 (*)    Neutro Abs 10.9 (*)    All other components within normal limits  HCG, SERUM, QUALITATIVE    EKG None  Radiology CT Head Wo Contrast  Result Date: 09/24/2021 CLINICAL DATA:  Numbness along the buttocks tingling in the feet and thighs. EXAM: CT HEAD WITHOUT CONTRAST TECHNIQUE: Contiguous axial images were obtained from the base of the skull through the vertex without intravenous contrast. COMPARISON:  None. FINDINGS: Brain: The brainstem, cerebellum, cerebral peduncles, thalami, basal ganglia, basilar cisterns, and ventricular system appear within normal limits. No intracranial hemorrhage, mass lesion, or acute CVA. Vascular: Unremarkable Skull: Unremarkable Sinuses/Orbits: Unremarkable Other: No supplemental non-categorized findings. IMPRESSION: 1. No significant abnormality is observed. Electronically Signed   By: Van Clines M.D.   On: 09/24/2021 21:36   CT Lumbar Spine Wo Contrast  Result Date: 09/24/2021 CLINICAL DATA:  Numbness along the buttocks and tingling sensation along the feet and thighs. EXAM: CT LUMBAR SPINE WITHOUT CONTRAST TECHNIQUE: Multidetector CT imaging of the lumbar spine was performed without intravenous contrast administration. Multiplanar CT image reconstructions were also generated. COMPARISON:  CT abdomen 08/03/2018 FINDINGS: Segmentation: The lowest lumbar type non-rib-bearing vertebra is labeled as L5. Alignment: No vertebral subluxation is observed. Vertebrae: The broad right transverse process of L5 mildly pseudo articulates with the sacrum. No fracture or acute bony abnormality is observed. Paraspinal and other soft tissues: We partially image the previously seen cyst along the falciform ligament in the liver. Disc levels: No significant findings above the L4-5 level. L4-5: Substantial central narrowing of the thecal sac with AP  diameter of the thecal sac potentially narrowed to about 0.6 cm due to a central disc protrusion. Roughly similar to the 08/03/2018 exam. L5-S1: No overt impingement, small central disc protrusion. Similar to the 08/03/2018 exam. IMPRESSION: 1. There is potentially substantial central narrowing of the thecal sac at the L4-5 level primarily due to a broad central disc protrusion, although this is roughly similar to the appearance on 08/03/2018. 2. No overt impingement at L5-S1 although there is probably a small central disc protrusion at this level as well. Electronically Signed   By: Van Clines M.D.   On: 09/24/2021 21:43    Procedures Procedures   Medications Ordered in ED Medications - No data to display  ED Course  I have reviewed the triage vital signs and the nursing notes.  Pertinent labs & imaging results that were available during my care of the patient were reviewed by me and considered in my medical decision making (see chart for details).    MDM Rules/Calculators/A&P                           Patient's story very concerning for perhaps cauda equina.  But no pain.  However exam seems unlikely with good lower extremity strength.  But she claims that she cannot walk properly.  CT head negative.  CT lumbar spine showed some narrowing but no acute changes compared to old.  Complete metabolic panel without any significant abnormalities.  Renal functions normal.  Mild leukocytosis with a white count of 12.6.  No fever.  Platelets are normal.  We will go ahead and arrange for MRI lumbar with and without because of the white count.  To be very thorough just to rule out any cord compression.  Little atypical and that there is does not seem to be any significant lower extremity weakness.  Is questionable sphincter tone weakness.  There is numbness.  And patient's seems to have difficulty walking.  Gust with Noemi Chapel patient will be transferred to the ED to have MRI done MRI is  already ordered. Final Clinical Impression(s) / ED Diagnoses Final diagnoses:  Cauda equina syndrome [facility])    Rx / DC Orders ED Discharge Orders     None        Fredia Sorrow, MD 09/24/21 2220

## 2021-09-24 NOTE — Progress Notes (Signed)
Patient ID: PALMYRA ROGACKI, female   DOB: [birth year], 35 y.o.   MRN: [id number]        Chief Complaint: follow up numbness to both legs       HPI:  TALOR DESROSIERS is a 35 y.o. female here with c/o acute onset 3-4 days bilateral whole leg numbness with tingling from the hips/lower lumbar to the toes, but now progressed to the more of the upper lumbar area, associated with mild vague overall discomfort but new worsening gait such that she now has to consciously watch her foot falls and raise her feet due to low sensation to each foot as well as mild subjective weakness.  No prior hx.  Has hx of anxiety but really only became nervous today with worsening symptoms and d/w her internist father who asked her to be seen. No falls, trauma, fever or other injury.  BP usually < 140/90 at home.  Works sitting most days, and no unusual activity recently.          Wt Readings from Last 3 Encounters:  09/24/21 245 lb (111.1 kg)  06/28/21 255 lb (115.7 kg)  12/27/18 222 lb (100.7 kg)   BP Readings from Last 3 Encounters:  09/24/21 (!) 150/92  09/24/21 (!) 158/90  06/28/21 122/82         Past Medical History:  Diagnosis Date   Asthma    Chicken pox    Depression    Seasonal allergies    History reviewed. No pertinent surgical history.  reports that she has never smoked. She has never used smokeless tobacco. She reports current alcohol use of about 5.0 standard drinks per week. She reports that she does not currently use drugs. family history includes Breast cancer in her mother; Colon cancer in her maternal grandfather; Stroke in her maternal grandmother; Thyroid disease in her maternal grandmother and mother. No Known Allergies Current Outpatient Medications on File Prior to Visit  Medication Sig Dispense Refill   albuterol (PROVENTIL HFA;VENTOLIN HFA) 108 (90 Base) MCG/ACT inhaler Inhale 1 puff into the lungs every 6 (six) hours as needed for wheezing or shortness of breath.     beclomethasone  (QVAR) 80 MCG/ACT inhaler Inhale 1 puff into the lungs daily.     famotidine (PEPCID) 20 MG tablet Take 1 tablet (20 mg total) by mouth 2 (two) times daily. (Patient taking differently: Take 20 mg by mouth daily.) 30 tablet 0   levocetirizine (XYZAL) 5 MG tablet Take 5 mg by mouth every evening.     Olopatadine HCl 0.6 % SOLN Place 2 sprays into both nostrils daily.     No current facility-administered medications on file prior to visit.        ROS:  All others reviewed and negative.  Objective        PE:  BP (!) 158/90 (BP Location: Right Arm, Patient Position: Sitting, Cuff Size: Large)   Pulse (!) 131   Temp 99 F (37.2 C) (Oral)   Ht 5' 4.17" (1.63 m)   Wt 245 lb (111.1 kg)   SpO2 98%   BMI 41.83 kg/m                 Constitutional: Pt appears in NAD, LEFT handed               HENT: Head: NCAT.                Right Ear: External ear normal.  Left Ear: External ear normal.                Eyes: . Pupils are equal, round, and reactive to light. Conjunctivae and EOM are normal               Nose: without d/c or deformity               Neck: Neck supple. Gross normal ROM               Cardiovascular: Normal rate and regular rhythm.                 Pulmonary/Chest: Effort normal and breath sounds without rales or wheezing.                Abd:  Soft, NT, ND, + BS, no organomegaly               Neurological: Pt is alert. At baseline orientation, motor grossly intact and decreased sens to LT bilat LEs               Skin: Skin is warm. No rashes, no other new lesions, LE edema - none               Psychiatric: Pt behavior is normal without agitation but mod nervous  Micro: none  Cardiac tracings I have personally interpreted today:  none  Pertinent Radiological findings (summarize): none   Lab Results  Component Value Date   WBC 7.9 06/28/2021   HGB 14.0 06/28/2021   HCT 41.6 06/28/2021   PLT 311.0 06/28/2021   GLUCOSE 86 06/28/2021   CHOL 257 (H) 06/28/2021    TRIG 73.0 06/28/2021   HDL 72.30 06/28/2021   LDLDIRECT 164.5 12/11/2012   LDLCALC 170 (H) 06/28/2021   ALT 12 06/28/2021   AST 13 06/28/2021   NA 139 06/28/2021   K 4.4 06/28/2021   CL 104 06/28/2021   CREATININE 0.81 06/28/2021   BUN 11 06/28/2021   CO2 27 06/28/2021   TSH 2.12 06/28/2021   HGBA1C 5.1 06/28/2021   Assessment/Plan:  LYNNELLE MESMER is a 35 y.o. White or Caucasian [1] female with  has a past medical history of Asthma, Chicken pox, Depression, and Seasonal allergies.  Paresthesia of both legs Pt c/o 3-4 days onset numbness and tingling whole bilateral lower legs starting at about the lower lumbar level it seems, but now progression higher in the lumbar area, associated with mild worsening gait such that she now has to consciously watch her foot falls and raise her feet due to low sensation to each as well as mild subjective weakness.   Etiology unclear but appears to be likely relatively rapid worsening neuritic process to the lumbar.  Pt is afeb, no trauma, no falls.  Pt will need urgent eval - advised to ED for MRI and /or neurology consult.   Elevated blood-pressure reading without diagnosis of hypertension Most likely c/w reactive,  to f/u any worsening symptoms or concerns, cont same tx  Anxiety Mild to mod, likely situational, doubt the primary cause of symptoms Followup: Return if symptoms worsen or fail to improve.  Cathlean Cower, MD 09/24/2021 8:11 PM [facility]

## 2021-09-24 NOTE — Patient Instructions (Signed)
Please go to ED now for further evaluation.

## 2021-09-24 NOTE — Assessment & Plan Note (Signed)
Most likely c/w reactive,  to f/u any worsening symptoms or concerns, cont same tx

## 2021-09-24 NOTE — Assessment & Plan Note (Signed)
Pt c/o 3-4 days onset numbness and tingling whole bilateral lower legs starting at about the lower lumbar level it seems, but now progression higher in the lumbar area, associated with mild worsening gait such that she now has to consciously watch her foot falls and raise her feet due to low sensation to each as well as mild subjective weakness.   Etiology unclear but appears to be likely relatively rapid worsening neuritic process to the lumbar.  Pt is afeb, no trauma, no falls.  Pt will need urgent eval - advised to ED for MRI and /or neurology consult.

## 2021-09-25 ENCOUNTER — Emergency Department (HOSPITAL_COMMUNITY): Payer: BC Managed Care – PPO

## 2021-09-25 ENCOUNTER — Inpatient Hospital Stay (HOSPITAL_COMMUNITY): Payer: BC Managed Care – PPO

## 2021-09-25 ENCOUNTER — Encounter (HOSPITAL_COMMUNITY): Payer: Self-pay | Admitting: Internal Medicine

## 2021-09-25 DIAGNOSIS — G379 Demyelinating disease of central nervous system, unspecified: Secondary | ICD-10-CM | POA: Diagnosis present

## 2021-09-25 DIAGNOSIS — Q120 Congenital cataract: Secondary | ICD-10-CM | POA: Diagnosis not present

## 2021-09-25 DIAGNOSIS — F32A Depression, unspecified: Secondary | ICD-10-CM | POA: Diagnosis present

## 2021-09-25 DIAGNOSIS — G35 Multiple sclerosis: Secondary | ICD-10-CM | POA: Diagnosis present

## 2021-09-25 DIAGNOSIS — F419 Anxiety disorder, unspecified: Secondary | ICD-10-CM | POA: Diagnosis present

## 2021-09-25 DIAGNOSIS — Z7951 Long term (current) use of inhaled steroids: Secondary | ICD-10-CM | POA: Diagnosis not present

## 2021-09-25 DIAGNOSIS — Z20822 Contact with and (suspected) exposure to covid-19: Secondary | ICD-10-CM | POA: Diagnosis present

## 2021-09-25 DIAGNOSIS — Z6841 Body Mass Index (BMI) 40.0 and over, adult: Secondary | ICD-10-CM | POA: Diagnosis not present

## 2021-09-25 DIAGNOSIS — G834 Cauda equina syndrome: Secondary | ICD-10-CM | POA: Diagnosis present

## 2021-09-25 DIAGNOSIS — Z79899 Other long term (current) drug therapy: Secondary | ICD-10-CM | POA: Diagnosis not present

## 2021-09-25 DIAGNOSIS — J45909 Unspecified asthma, uncomplicated: Secondary | ICD-10-CM | POA: Diagnosis present

## 2021-09-25 LAB — RESP PANEL BY RT-PCR (FLU A&B, COVID) ARPGX2
Influenza A by PCR: NEGATIVE
Influenza B by PCR: NEGATIVE
SARS Coronavirus 2 by RT PCR: NEGATIVE

## 2021-09-25 IMAGING — MR MR LUMBAR SPINE W/O CM
4 of 5 series · 27 of 48 positions shown · non-contrast
Comparison: Prior CT from [DATE].

CLINICAL DATA: Initial evaluation for acute low back pain, cauda
equina syndrome suspected. Patient with lower extremity numbness,
difficulty walking.

EXAM:
MRI LUMBAR SPINE WITHOUT CONTRAST
TECHNIQUE: Multiplanar, multisequence MR imaging of the lumbar spine was
performed. No intravenous contrast was administered.

[Series 5: T2 · sagittal · 4.0mm · 0.73mm/px · 5 of 16 slices shown (1 of 2)]
[im 1/16]
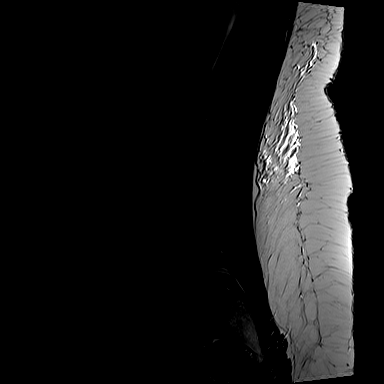
[im 4/16]
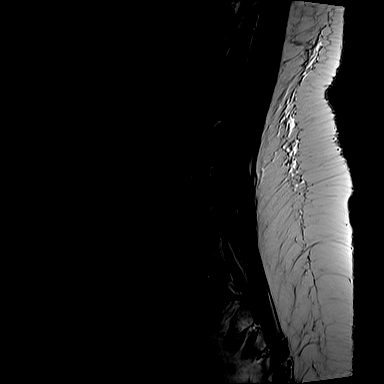
[im 8/16]
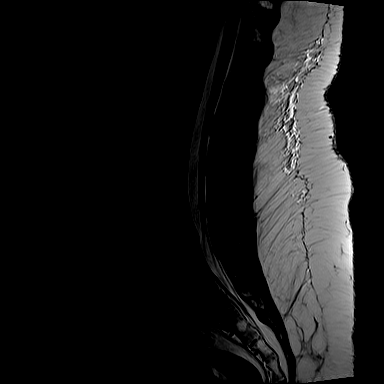
[im 12/16]
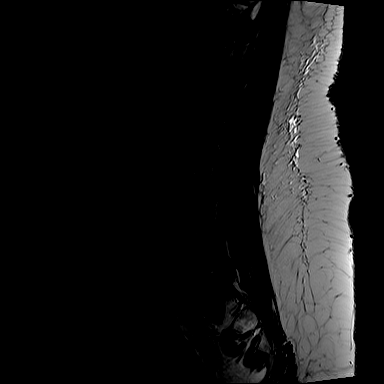
[im 16/16]
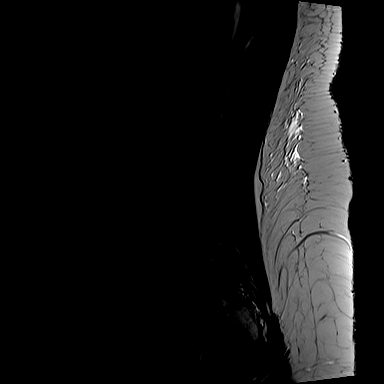

[Series 7: T1 · sagittal · 4.0mm · 0.88mm/px · 6 of 16 slices shown (1 of 2)]
[im 1/16]
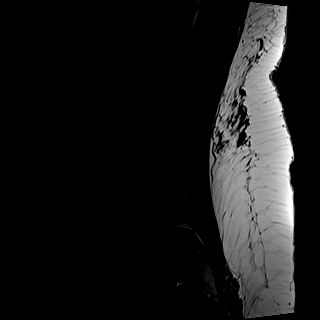
[im 4/16]
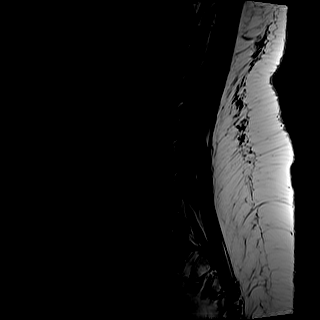
[im 7/16]
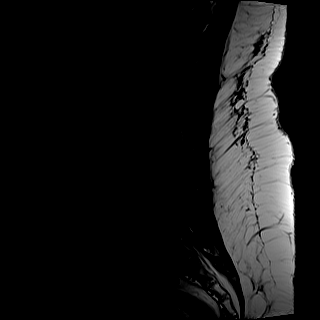
[im 10/16]
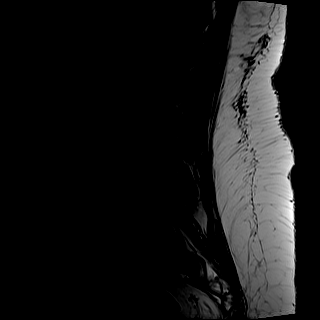
[im 13/16]
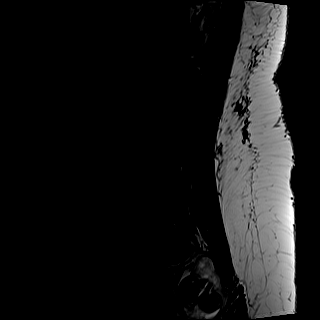
[im 16/16]
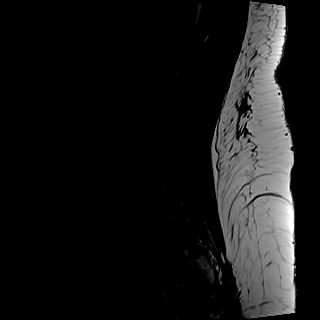

[Series 8: T2 · axial · 4.0mm · 0.60mm/px · z∈[-66,+152]mm · 10 of 44 slices shown (2 of 2)]
[im 3/44]
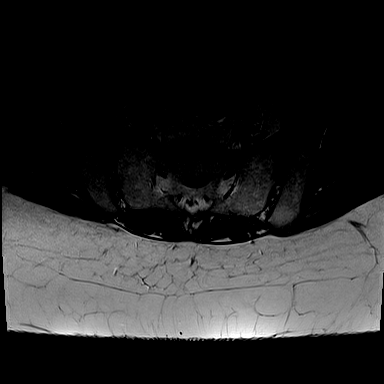
[im 6/44]
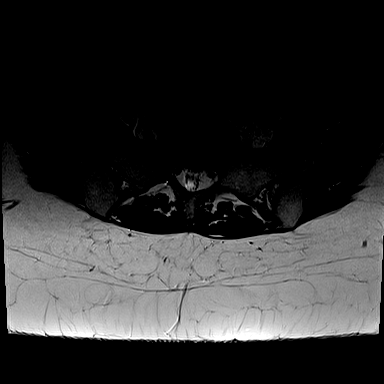
[im 9/44]
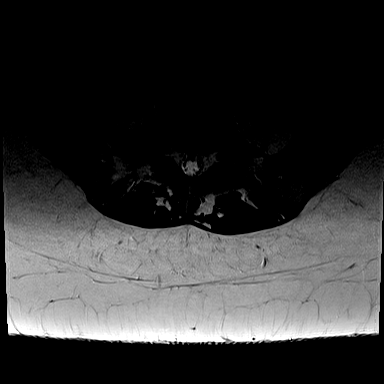
[im 15/44]
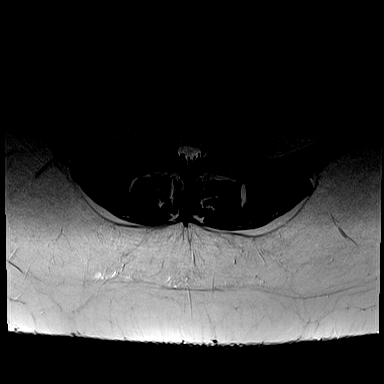
[im 21/44]
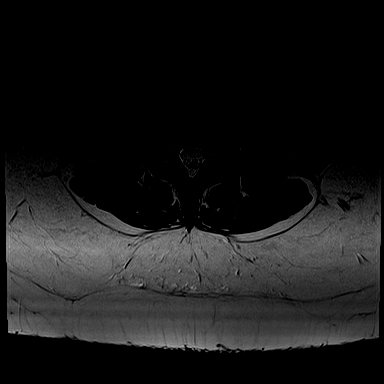
[im 23/44]
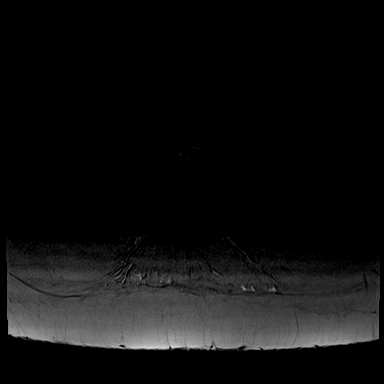
[im 26/44]
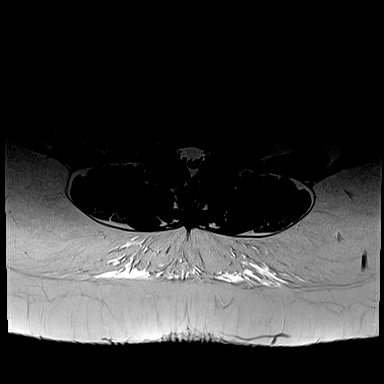
[im 32/44]
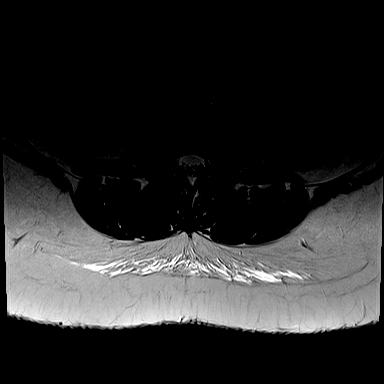
[im 38/44]
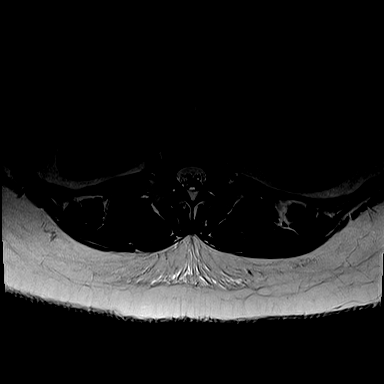
[im 44/44]
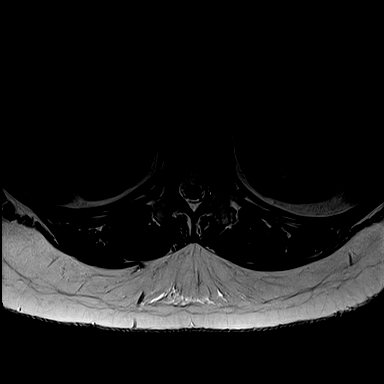

[Series 9: T1 · axial · 4.0mm · 0.40mm/px · z∈[-66,+122]mm · 6 of 44 slices shown (2 of 2)]
[im 3/44]
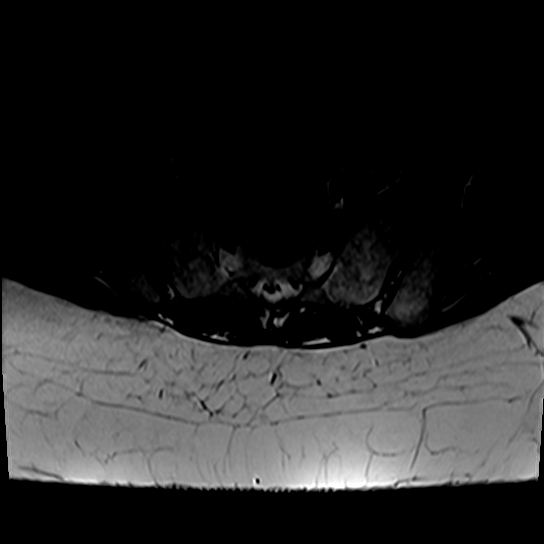
[im 6/44]
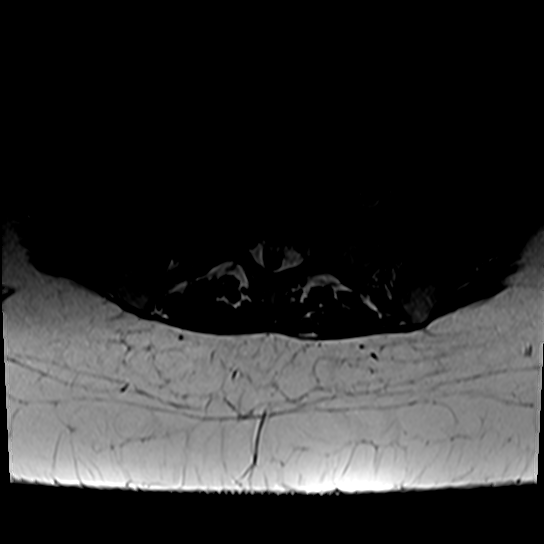
[im 9/44]
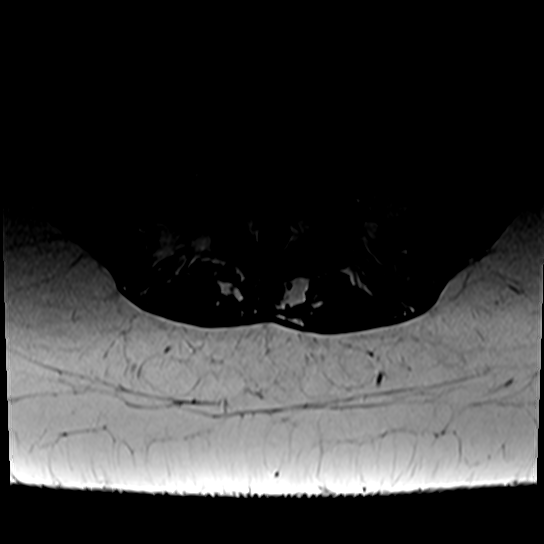
[im 15/44]
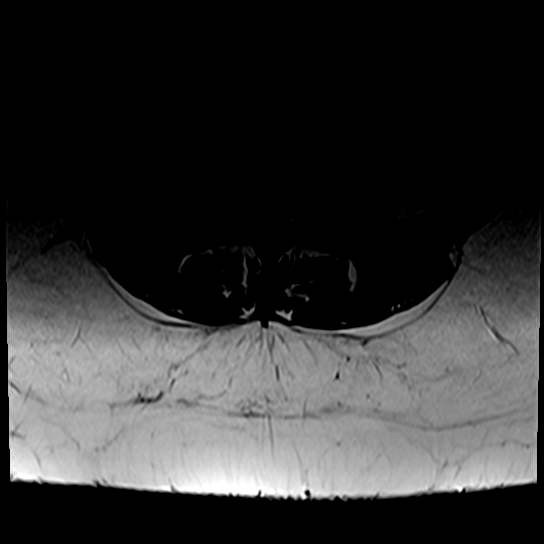
[im 23/44]
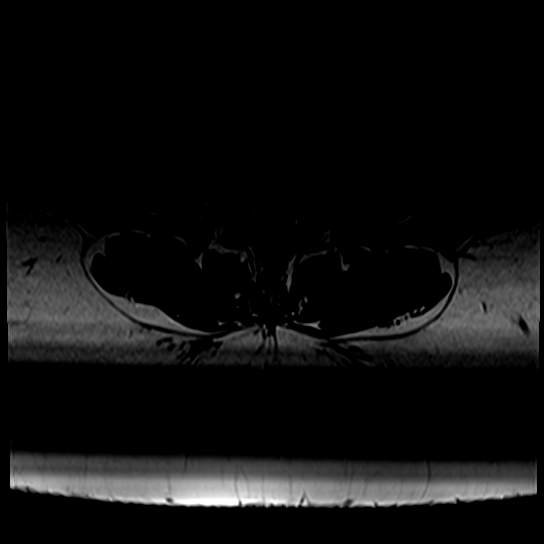
[im 38/44]
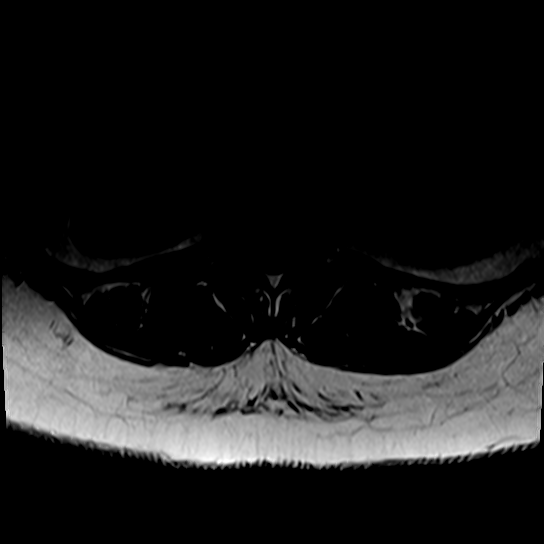

[27 of 48 positions shown; findings below may reference images not displayed]

FINDINGS: Segmentation: Transitional features present about the lumbosacral
junction with partial sacralization of the L5 vertebral body on the
right.

Alignment: Physiologic with preservation of the normal lumbar
lordosis. No listhesis.

Vertebrae: Vertebral body height maintained without acute or chronic
fracture. Bone marrow signal intensity within normal limits.
Subcentimeter benign hemangioma noted within the T12 vertebral body.
No other discrete or worrisome osseous lesions. No abnormal marrow
edema.

Conus medullaris and cauda equina: Conus extends to the L1 level. On
sagittal T2 and STIR sequences, there is question of increased
T2/stir signal within the partially visualized distal cord at the
level of T11-12 (series 5, image 9 on sagittal T2 weighted sequence)
(series 6, image 10 on sagittal STIR sequence). This area is not
really included on axial images.

Paraspinal and other soft tissues: Paraspinous soft tissues within
normal limits. 3.9 cm intramural fibroid present at the right
posterior uterine fundus/body. Visualized visceral structures
otherwise unremarkable.

Disc levels:

L1-2:  Unremarkable.

L2-3: Normal interspace. Minimal facet spurring. No canal or
foraminal stenosis.

L3-4: Normal interspace. Mild bilateral facet hypertrophy. No canal
or foraminal stenosis.

L4-5: Mild degenerative intervertebral disc space narrowing with
diffuse disc bulge and disc desiccation. Superimposed small central
disc protrusion with annular fissure indents the ventral thecal sac
(series 8, image 33). Mild to moderate facet and ligament flavum
hypertrophy. Resultant mild spinal stenosis. Foramina remain patent.
No frank impingement.

L5-S1: Shallow central disc protrusion indents the ventral thecal
sac (series 8, image 37). No significant canal or lateral recess
stenosis. Foramina remain patent. No impingement.
IMPRESSION: 1. Question increased T2/STIR signal within the partially visualized
distal cord at the level of T11-12 as above, indeterminate. While
this finding could potentially be artifactual in nature given that
this is seen at the margins of the provided images, possible
myelitis or other intrinsic spinal cord process is difficult to
exclude, particularly given the patient's history and symptoms.
Further evaluation with dedicated MRI of the thoracic spine, with
and without contrast, recommended for further evaluation.
2. Small central disc protrusion with annular fissure and facet
hypertrophy at L4-5 with resultant mild spinal stenosis.
3. Shallow central disc protrusion at L5-S1 without stenosis or
impingement.
4. 3.9 cm intramural fibroid at the right posterior uterine
fundus/body.

## 2021-09-25 IMAGING — MR MR CERVICAL SPINE WO/W CM
7 of 9 series · 31 of 48 positions shown · IV contrast (gadavist)
Comparison: CT head and lumbar spine [DATE].

CLINICAL DATA: 35-year-old female with acute onset glove like
extremity numbness [DATE]. Difficulty walking. Abnormal gait. No
incontinence. No back or extremity pain.

EXAM:
MRI CERVICAL SPINE WITHOUT AND WITH CONTRAST
TECHNIQUE: Multiplanar and multiecho pulse sequences of the cervical spine, to
include the craniocervical junction and cervicothoracic junction,
were obtained without and with intravenous contrast.
CONTRAST:  9mL GADAVIST GADOBUTROL 1 MMOL/ML IV SOLN

[Series 16: T2 · sagittal · 3.0mm · 0.69mm/px · 4 of 17 slices shown (1 of 2)]
[im 1/17]
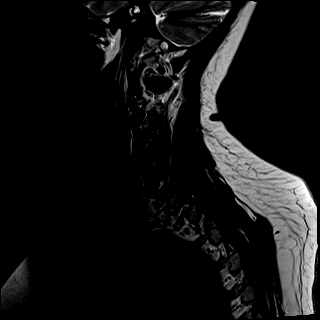
[im 6/17]
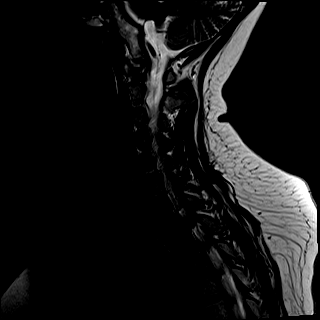
[im 11/17]
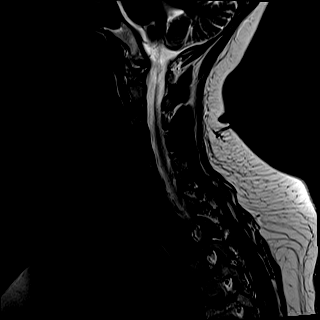
[im 17/17]
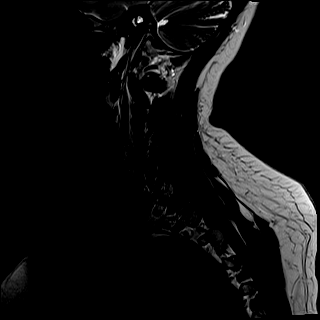

[Series 17: T1 · sagittal · 3.0mm · 0.69mm/px · 3 of 17 slices shown (1 of 2)]
[im 1/17]
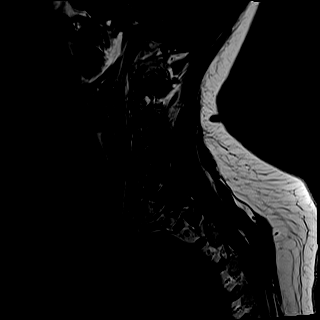
[im 9/17]
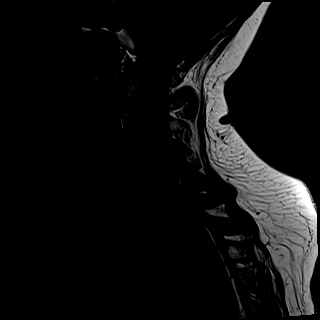
[im 17/17]
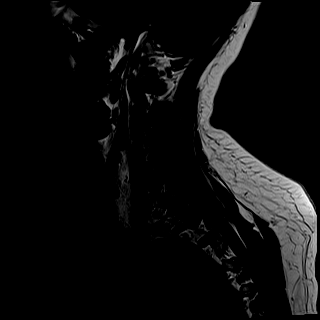

[Series 18: STIR · sagittal · 3.0mm · 0.86mm/px · 3 of 17 slices shown]
[im 1/17]
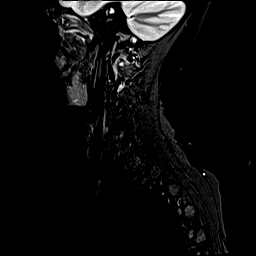
[im 9/17]
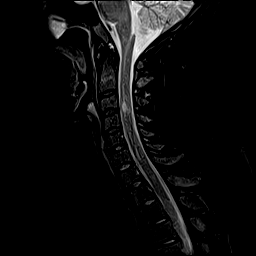
[im 17/17]
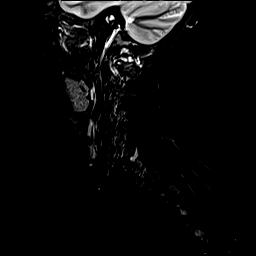

[Series 19: T2 · axial · 3.0mm · 0.78mm/px · z∈[-96,+48]mm · 8 of 46 slices shown (2 of 2)]
[im 1/46]
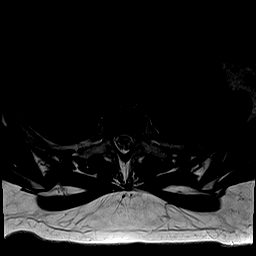
[im 7/46]
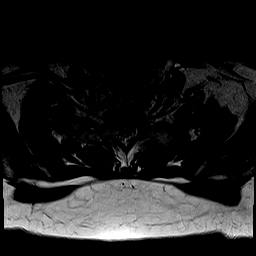
[im 13/46]
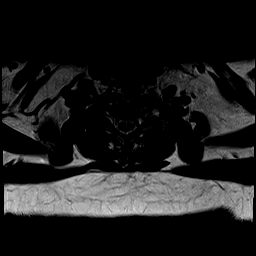
[im 20/46]
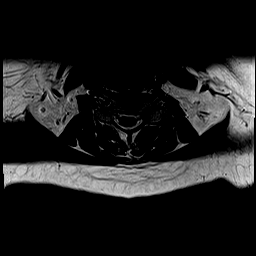
[im 26/46]
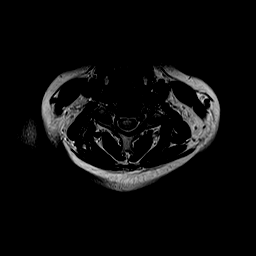
[im 33/46]
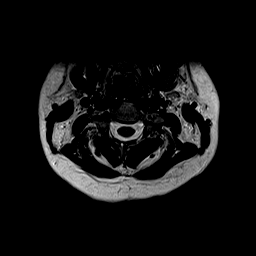
[im 39/46]
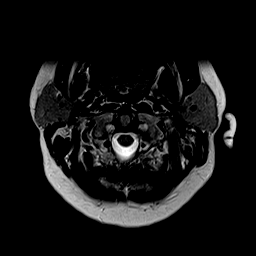
[im 46/46]
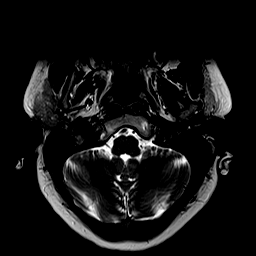

[Series 21: T1 · axial · 3.0mm · 0.39mm/px · z∈[-96,+48]mm · 8 of 46 slices shown (2 of 2)]
[im 1/46]
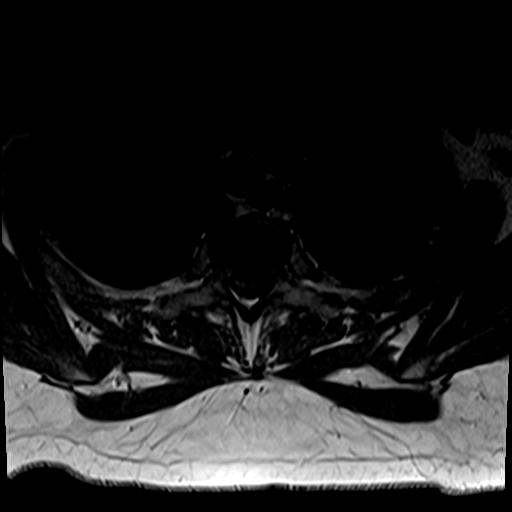
[im 7/46]
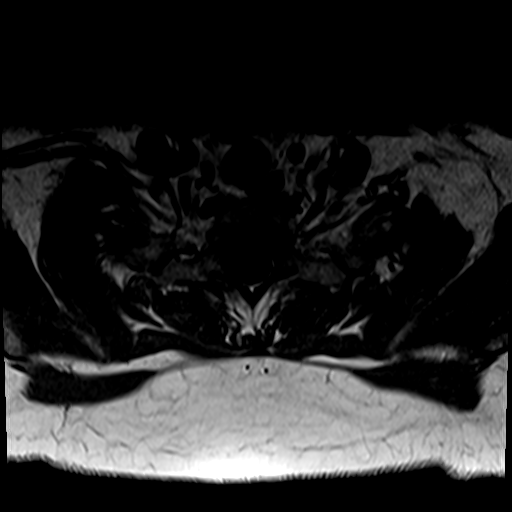
[im 13/46]
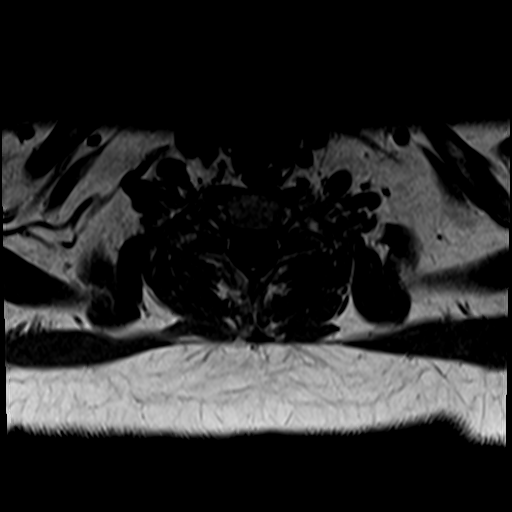
[im 20/46]
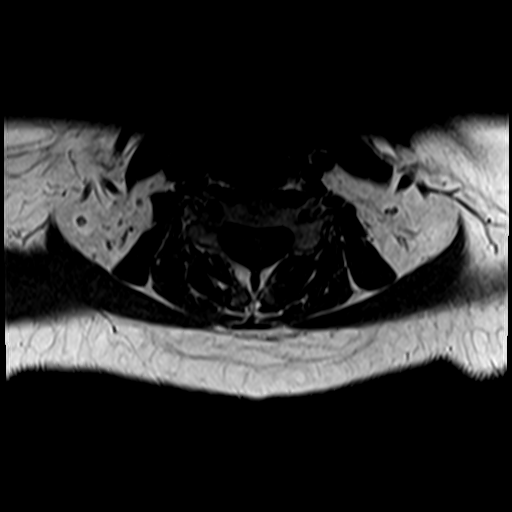
[im 26/46]
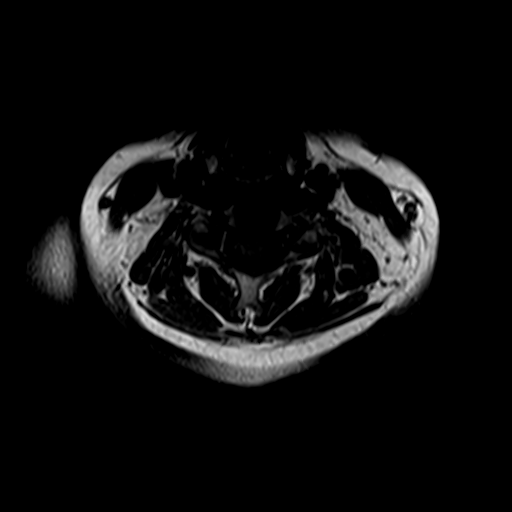
[im 33/46]
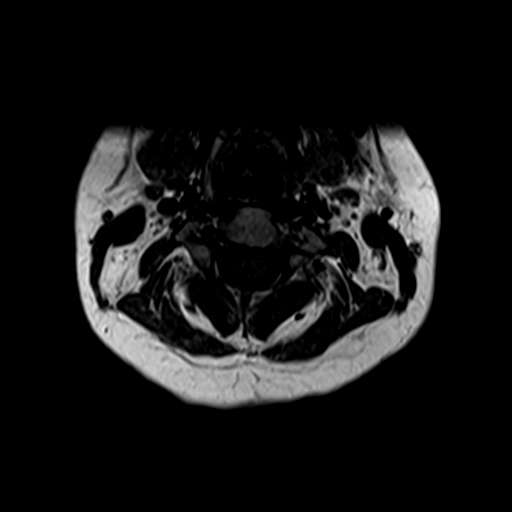
[im 39/46]
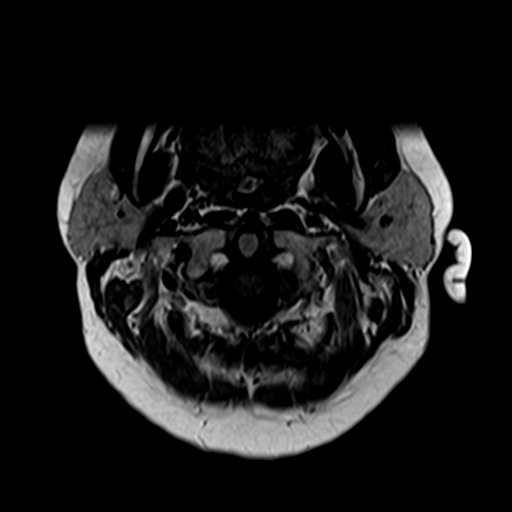
[im 46/46]
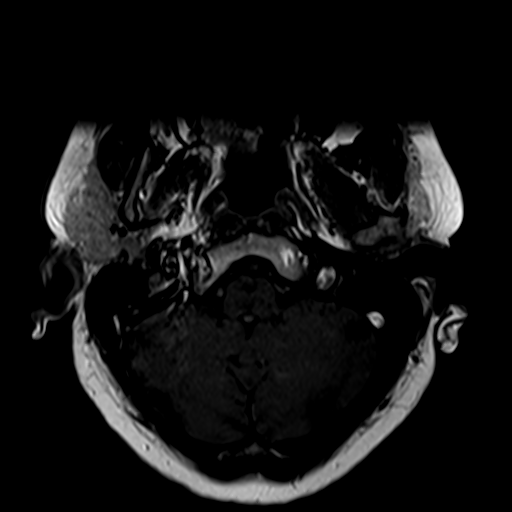

[Series 22: T1 fat-sat post-contrast · sagittal · 3.0mm · 0.43mm/px · 3 of 17 slices shown]
[im 1/17]
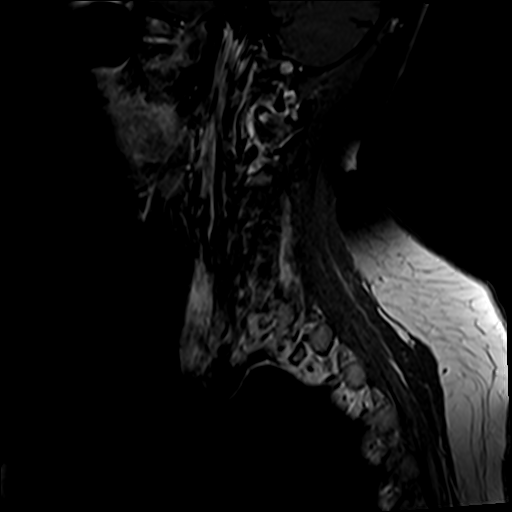
[im 9/17]
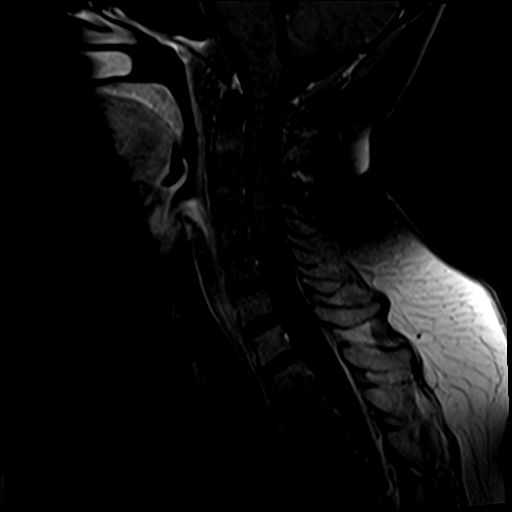
[im 17/17]
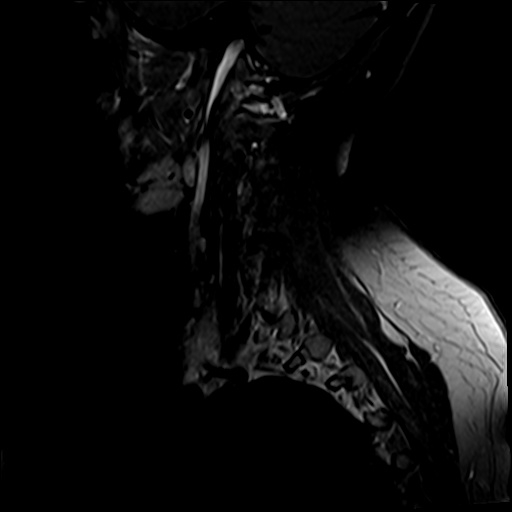

[Series 23: T1 post-contrast · sagittal · 3.0mm · 0.69mm/px · 2 of 17 slices shown]
[im 1/17]
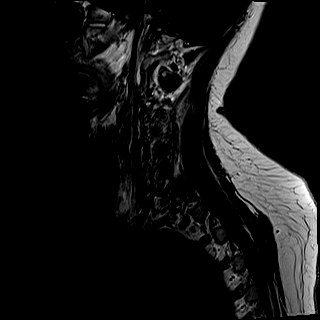
[im 9/17]
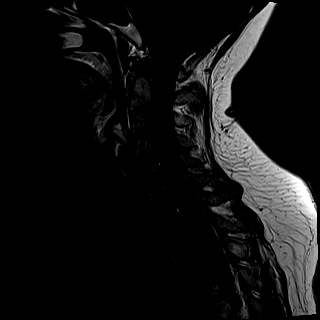

[31 of 48 positions shown; findings below may reference images not displayed]

FINDINGS: Alignment: Preserved cervical lordosis.  No spondylolisthesis.

Vertebrae: No marrow edema or evidence of acute osseous abnormality.
Visualized bone marrow signal is within normal limits.

Cord: Abnormal. There is a round, indistinct roughly 6-7 mm diameter
area of abnormal T2 and STIR hyperintensity within the ventral
cervical cord at the C3-C4 level (series 18, image 9 and series 19,
image 21. The cord appears slightly expanded, but there is no
associated enhancement.

Caudal to that there is a questionable smaller more subtle and
indistinct T2 hyperintense cord lesion at C7-T1. See series 16,
image 19. No cord expansion or enhancement. No myelomalacia. No
dural thickening or abnormal intradural enhancement.

Posterior Fossa, vertebral arteries, paraspinal tissues:
Cervicomedullary junction is within normal limits. Negative visible
posterior fossa. Preserved major vascular flow voids in the neck and
at the skull base. The right vertebral artery appears dominant.
Negative visible neck soft tissues. Negative visible upper chest.

Disc levels:

Negative. No age advanced or significant cervical spine
degeneration. No spinal stenosis. No foraminal stenosis. No abnormal
thickening or enhancement of the exiting cervical nerves.
IMPRESSION: 1. Nonenhancing T2/STIR hyperintense lesion within the ventral
cervical cord at C3-C4, and possible additional smaller, indistinct
nonenhancing lesion at C7-T1. No cord edema. Subtle cord expansion
at the former. No myelomalacia.
See also Thoracic Spine MRI today (reported separately). The
constellation is most suspicious for Spinal Cord Demyelinating
Disease, but other multifocal cord inflammation is possible (such as
Neurosarcoidosis). Follow-up Brain MRI without and with contrast
recommended.

2. No significant cervical spine degeneration. No spinal or neural
foraminal stenosis.

## 2021-09-25 IMAGING — MR MR HEAD WO/W CM
8 of 15 series · 20 of 48 positions shown · IV contrast (gadavist)
Comparison: Head CT [DATE]

CLINICAL DATA: Demyelinating disease. Acute onset of bilateral leg
numbness. Abnormal gait.

EXAM:
MRI HEAD WITHOUT AND WITH CONTRAST
TECHNIQUE: Multiplanar, multiecho pulse sequences of the brain and surrounding
structures were obtained without and with intravenous contrast.
CONTRAST:  10mL GADAVIST GADOBUTROL 1 MMOL/ML IV SOLN

[Series 2: DWI · axial · 3.0mm · 0.94mm/px · z∈[-47,+97]mm · 3 of 98 slices shown (1 of 2)]
[im 1/98]
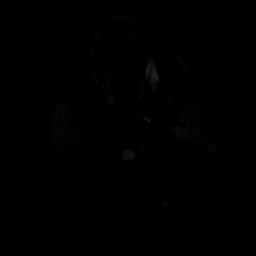
[im 49/98]
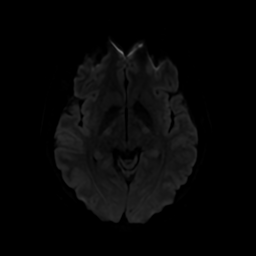
[im 98/98]
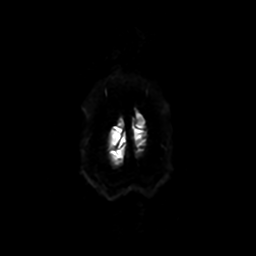

[Series 3: DWI · coronal · 4.0mm · 0.94mm/px · 3 of 72 slices shown (2 of 2)]
[im 1/72]
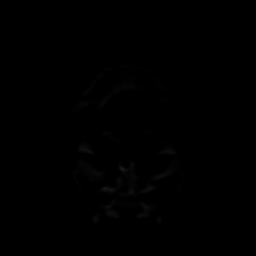
[im 36/72]
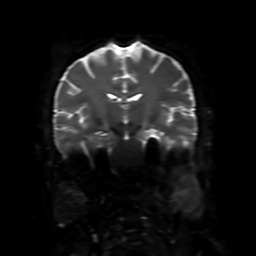
[im 72/72]
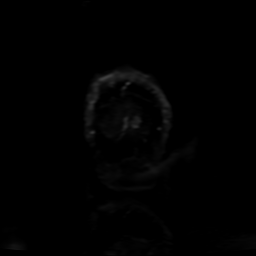

[Series 4: FLAIR · sagittal · 5.0mm · 0.23mm/px · 1 of 23 slices shown (1 of 3)]
[im 1/23]
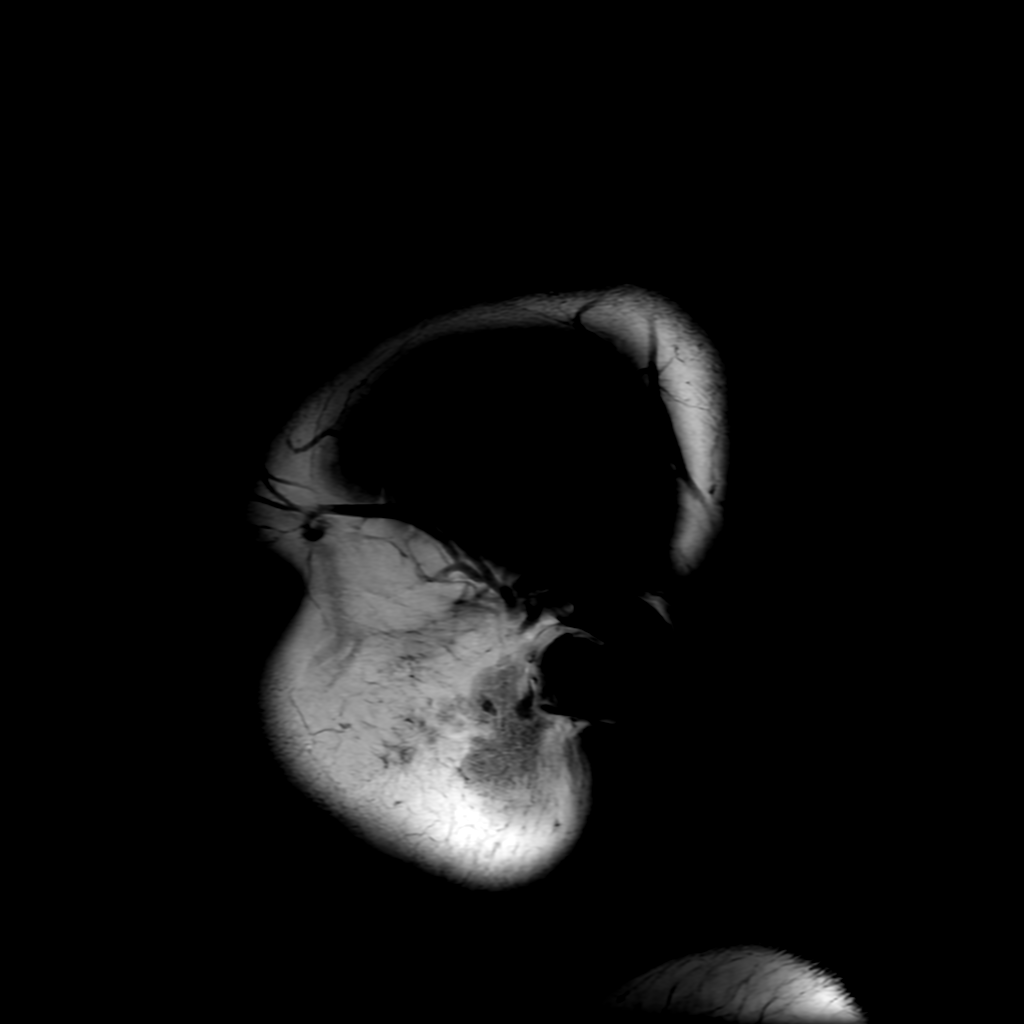

[Series 5: FLAIR · axial · 4.0mm · 0.45mm/px · 1 of 33 slices shown (2 of 3)]
[im 1/33]
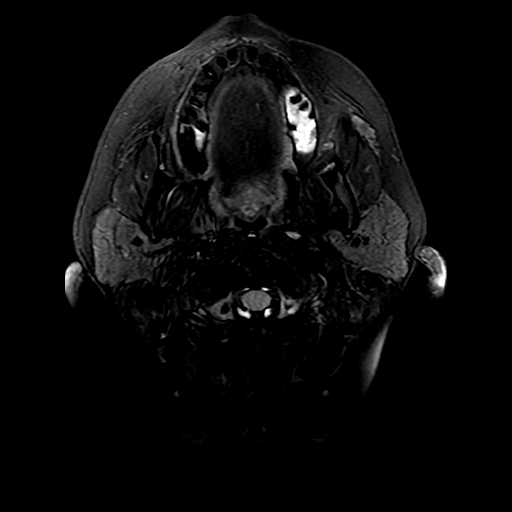

[Series 6: T2 · axial · 5.0mm · 0.23mm/px · 1 of 24 slices shown]
[im 1/24]
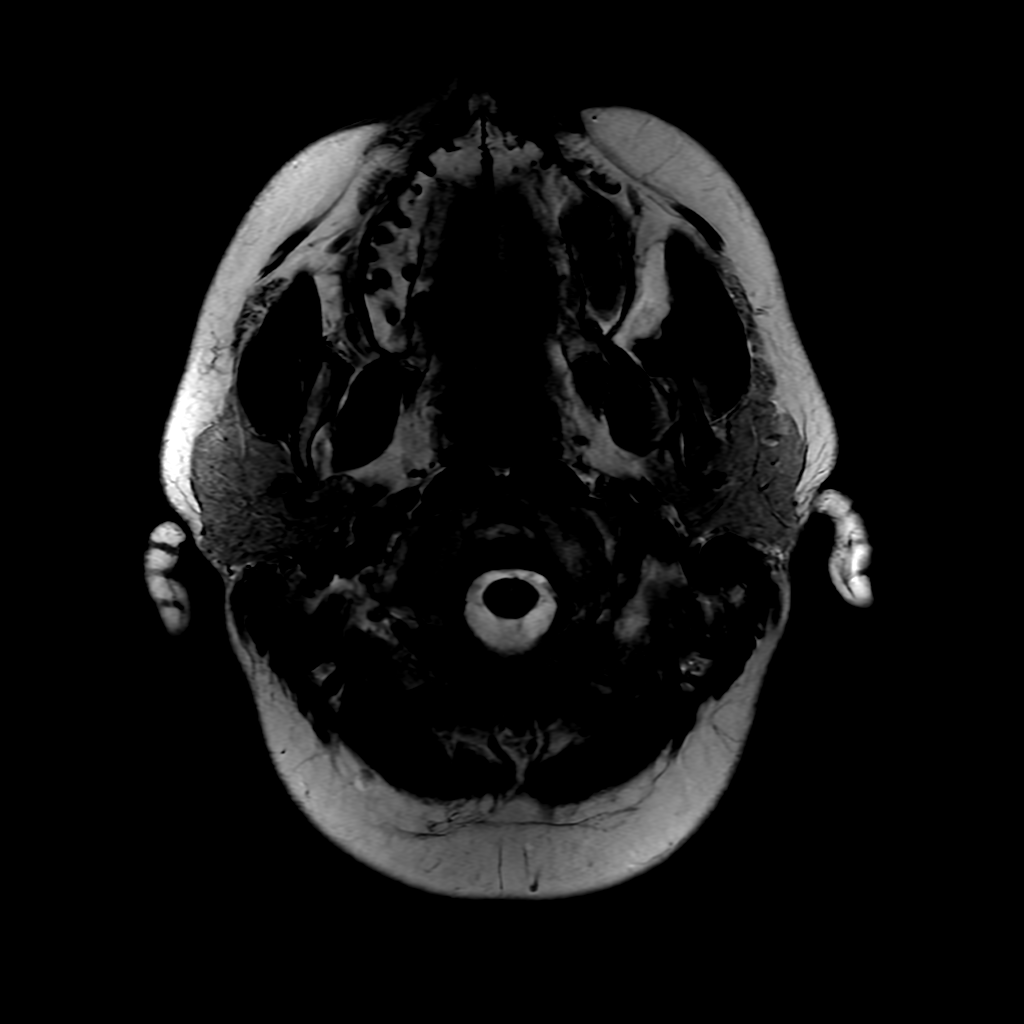

[Series 7: FLAIR · sagittal · 1.6mm · 0.49mm/px · 8 of 232 slices shown (3 of 3)]
[im 1/232]
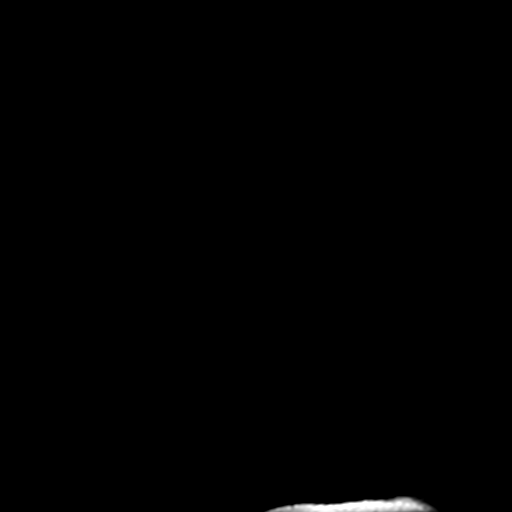
[im 34/232]
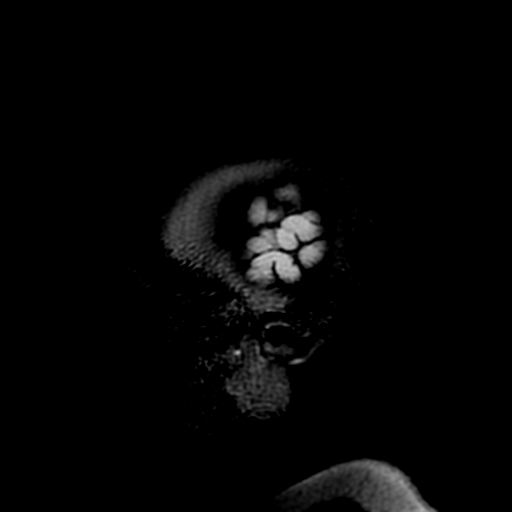
[im 67/232]
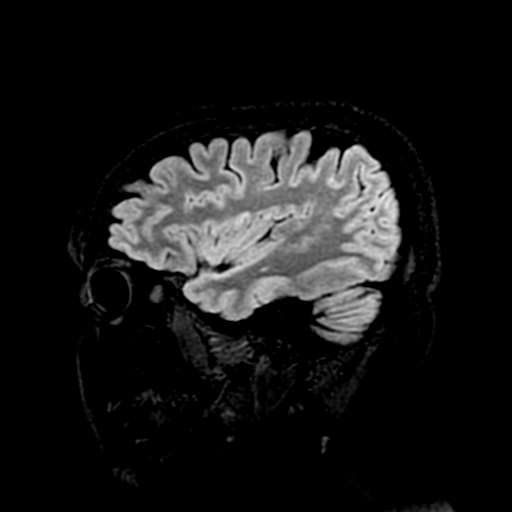
[im 100/232]
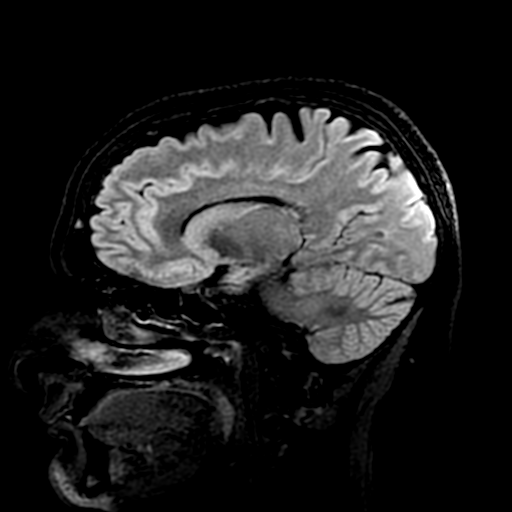
[im 133/232]
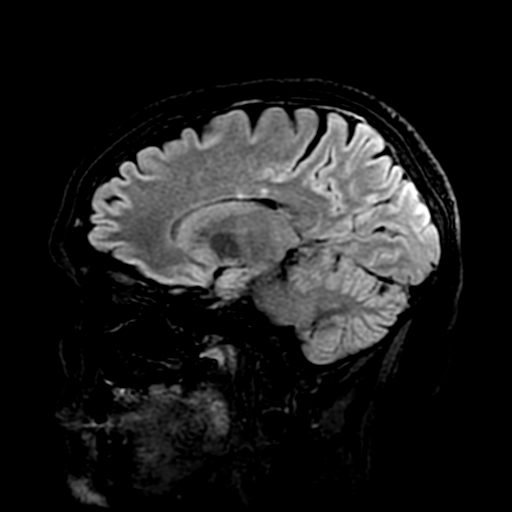
[im 166/232]
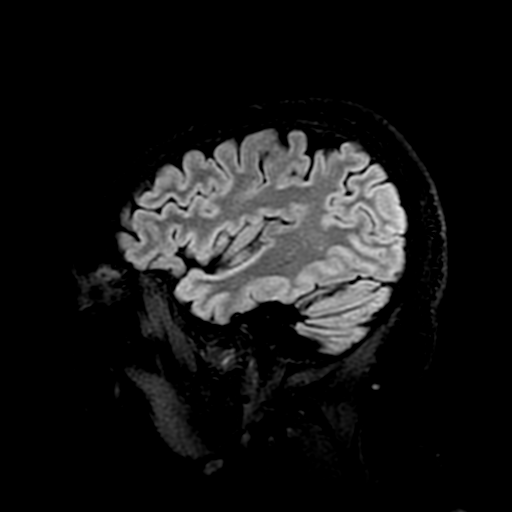
[im 199/232]
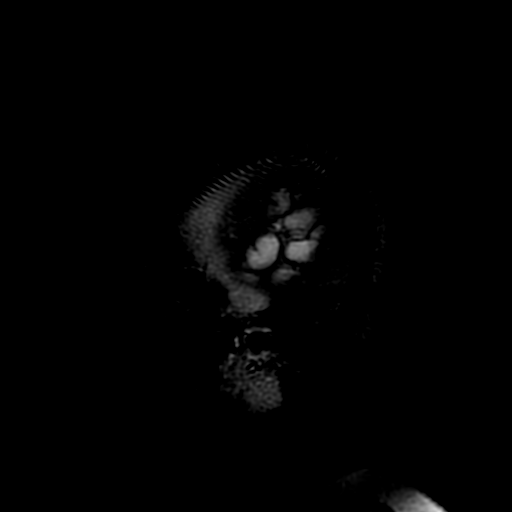
[im 232/232]
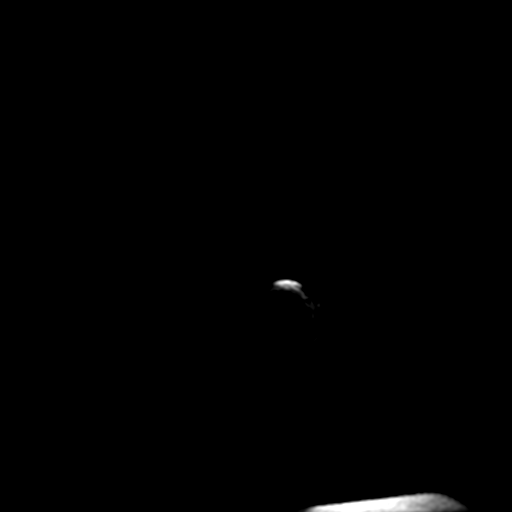

[Series 250: ADC · axial · 3.0mm · 0.94mm/px · z∈[-47,+97]mm · 2 of 49 slices shown (1 of 2)]
[im 1/49]
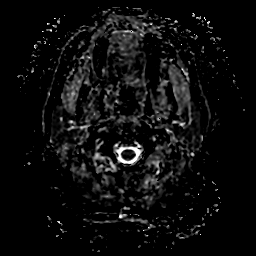
[im 49/49]
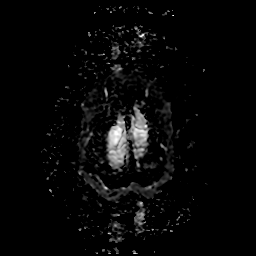

[Series 350: ADC · coronal · 4.0mm · 0.94mm/px · 1 of 35 slices shown (2 of 2)]
[im 1/35]
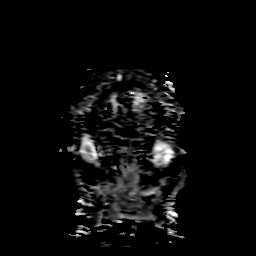

[20 of 48 positions shown; findings below may reference images not displayed]

FINDINGS: Brain: There is no evidence of an acute infarct, intracranial
hemorrhage, mass, midline shift, or extra-axial fluid collection.
The ventricles and sulci are normal. There are 15-20 small foci of
T2 FLAIR hyperintensity in the cerebral white matter. These are most
notable in the periventricular regions with some lesions oriented
perpendicularly to the lateral ventricles including one in the right
temporal lobe. There is involvement of the corpus callosum, and
there are a few juxtacortical lesions. Some of these lesions
demonstrate T2 shine through on diffusion-weighted imaging, however
no truly restricted diffusion or abnormal enhancement is identified.
The brainstem and cerebellum are normal in signal. There is a mildly
enlarged partially empty sella.

Vascular: Major intracranial vascular flow voids are preserved.

Skull and upper cervical spine: Unremarkable bone marrow signal.

Sinuses/Orbits: Unremarkable orbits. Paranasal sinuses and mastoid
air cells are clear.

Other: None.
IMPRESSION: Cerebral white matter disease most consistent with multiple
sclerosis. No evidence of active demyelination.

## 2021-09-25 MED ORDER — PANTOPRAZOLE SODIUM 40 MG PO TBEC
40.0000 mg | DELAYED_RELEASE_TABLET | Freq: Every day | ORAL | Status: DC
  Administered 2021-09-25: 40 mg via ORAL
  Filled 2021-09-25: qty 1

## 2021-09-25 MED ORDER — ACETAMINOPHEN 325 MG PO TABS
650.0000 mg | ORAL_TABLET | Freq: Four times a day (QID) | ORAL | Status: DC | PRN
  Administered 2021-09-26 – 2021-09-27 (×3): 650 mg via ORAL
  Filled 2021-09-25 (×3): qty 2

## 2021-09-25 MED ORDER — GADOBUTROL 1 MMOL/ML IV SOLN
10.0000 mL | Freq: Once | INTRAVENOUS | Status: AC | PRN
  Administered 2021-09-25: 10 mL via INTRAVENOUS

## 2021-09-25 MED ORDER — DOCUSATE SODIUM 100 MG PO CAPS
100.0000 mg | ORAL_CAPSULE | Freq: Two times a day (BID) | ORAL | Status: DC
  Administered 2021-09-25 – 2021-09-28 (×6): 100 mg via ORAL
  Filled 2021-09-25 (×8): qty 1

## 2021-09-25 MED ORDER — HYDRALAZINE HCL 20 MG/ML IJ SOLN: 5.0000 mg | INTRAMUSCULAR | Status: DC | PRN

## 2021-09-25 MED ORDER — SODIUM CHLORIDE 0.9% FLUSH
3.0000 mL | Freq: Two times a day (BID) | INTRAVENOUS | Status: DC
  Administered 2021-09-25 – 2021-09-29 (×7): 3 mL via INTRAVENOUS

## 2021-09-25 MED ORDER — ONDANSETRON HCL 4 MG PO TABS: 4.0000 mg | ORAL_TABLET | Freq: Four times a day (QID) | ORAL | Status: DC | PRN

## 2021-09-25 MED ORDER — ALBUTEROL SULFATE (2.5 MG/3ML) 0.083% IN NEBU: 3.0000 mL | INHALATION_SOLUTION | Freq: Four times a day (QID) | RESPIRATORY_TRACT | Status: DC | PRN

## 2021-09-25 MED ORDER — GADOBUTROL 1 MMOL/ML IV SOLN
9.0000 mL | Freq: Once | INTRAVENOUS | Status: AC | PRN
  Administered 2021-09-25: 9 mL via INTRAVENOUS

## 2021-09-25 MED ORDER — BECLOMETHASONE DIPROPIONATE 80 MCG/ACT IN AERS: 1.0000 | INHALATION_SPRAY | Freq: Every day | RESPIRATORY_TRACT | Status: DC

## 2021-09-25 MED ORDER — LORAZEPAM 2 MG/ML IJ SOLN
1.0000 mg | Freq: Once | INTRAMUSCULAR | Status: AC | PRN
  Administered 2021-09-25: 1 mg via INTRAVENOUS
  Filled 2021-09-25: qty 1

## 2021-09-25 MED ORDER — CETIRIZINE HCL 10 MG PO TABS
5.0000 mg | ORAL_TABLET | Freq: Every evening | ORAL | Status: DC
  Administered 2021-09-26 – 2021-09-28 (×3): 5 mg via ORAL
  Filled 2021-09-25 (×6): qty 1

## 2021-09-25 MED ORDER — MORPHINE SULFATE (PF) 2 MG/ML IV SOLN: 2.0000 mg | INTRAVENOUS | Status: DC | PRN

## 2021-09-25 MED ORDER — POLYETHYLENE GLYCOL 3350 17 G PO PACK: 17.0000 g | PACK | Freq: Every day | ORAL | Status: DC | PRN

## 2021-09-25 MED ORDER — ENOXAPARIN SODIUM 60 MG/0.6ML IJ SOSY
0.5000 mg/kg | PREFILLED_SYRINGE | INTRAMUSCULAR | Status: DC
  Administered 2021-09-25 – 2021-09-28 (×4): 55 mg via SUBCUTANEOUS
  Filled 2021-09-25 (×3): qty 0.6
  Filled 2021-09-25: qty 0.55
  Filled 2021-09-25: qty 0.6

## 2021-09-25 MED ORDER — ONDANSETRON HCL 4 MG/2ML IJ SOLN: 4.0000 mg | Freq: Four times a day (QID) | INTRAMUSCULAR | Status: DC | PRN

## 2021-09-25 MED ORDER — BUDESONIDE 0.25 MG/2ML IN SUSP
0.2500 mg | Freq: Two times a day (BID) | RESPIRATORY_TRACT | Status: DC
  Administered 2021-09-26 – 2021-09-29 (×4): 0.25 mg via RESPIRATORY_TRACT
  Filled 2021-09-25 (×8): qty 2

## 2021-09-25 MED ORDER — FLUTICASONE PROPIONATE HFA 44 MCG/ACT IN AERO
2.0000 | INHALATION_SPRAY | Freq: Two times a day (BID) | RESPIRATORY_TRACT | Status: DC
  Filled 2021-09-25: qty 10.6

## 2021-09-25 MED ORDER — ACETAMINOPHEN 650 MG RE SUPP: 650.0000 mg | Freq: Four times a day (QID) | RECTAL | Status: DC | PRN

## 2021-09-25 MED ORDER — SODIUM CHLORIDE 0.9 % IV SOLN
1000.0000 mg | Freq: Every day | INTRAVENOUS | Status: AC
  Administered 2021-09-25 – 2021-09-29 (×5): 1000 mg via INTRAVENOUS
  Filled 2021-09-25 (×5): qty 16

## 2021-09-25 MED ORDER — FAMOTIDINE 20 MG PO TABS
20.0000 mg | ORAL_TABLET | Freq: Every evening | ORAL | Status: DC
  Administered 2021-09-25 – 2021-09-28 (×4): 20 mg via ORAL
  Filled 2021-09-25 (×4): qty 1

## 2021-09-25 MED ORDER — LORAZEPAM 2 MG/ML IJ SOLN
1.0000 mg | Freq: Once | INTRAMUSCULAR | Status: AC
  Administered 2021-09-25: 1 mg via INTRAVENOUS
  Filled 2021-09-25: qty 1

## 2021-09-25 MED ORDER — BISACODYL 5 MG PO TBEC: 5.0000 mg | DELAYED_RELEASE_TABLET | Freq: Every day | ORAL | Status: DC | PRN

## 2021-09-25 MED ORDER — HYDROCODONE-ACETAMINOPHEN 5-325 MG PO TABS: 1.0000 | ORAL_TABLET | ORAL | Status: DC | PRN

## 2021-09-25 NOTE — ED Notes (Signed)
Received verbal report from Alberto C RN at this time 

## 2021-09-25 NOTE — ED Notes (Signed)
MRI called and advised that due to pt recently having a contrast MRI that the MRI contrast of brain could not be done for at least 12 hrs. ED provider made aware. Per ED provider MRI needed to contact the ordering provider reference to same

## 2021-09-25 NOTE — H&P (Signed)
History and Physical    Jean Woods KPT:[id number] DOB: [birth year] DOA: 09/24/2021  PCP: Hoyt Koch, MD Consultants:  Remus Blake - allergy Patient coming from:  Home - lives with husband; NOK: Husband, .[phone]  Chief Complaint: Numbness and tingling of LE  HPI: Jean Woods is a 35 y.o. female without significant medical history presenting with numbness of B buttocks, tingling of B feet and thighs.  She went for a walk Thursday and noticed numbness on her legs Friday upon awakening.  She thought it was associated with edema and it didn't go away.  Her feet started tingling and her buttocks started tingling.  Her feet have gotten a little more numb and her calves have gotten sore, walking was a bit more difficulty.  No h/o prior.  She had severe blurriness and almost lost vision in 1 eye - was seen by eye doctor and told her allergist; it resolved completely.  She has congenital cataracts and so the eye doctor thought it might be related to that.  It happened a little over a year ago.  She has been dizzy recently.    ED Course: Presented to [facility] with numbness/weakness in legs, transferred here.  Looks like demyelinating disease.  MRI brain pending.  Dr. Cheral Marker consulted.  Needs steroids x 5 days.  Review of Systems: As per HPI; otherwise review of systems reviewed and negative.   Ambulatory Status:  Ambulates without assistance  COVID Vaccine Status:   Complete plus booster  Past Medical History:  Diagnosis Date   Asthma    Chicken pox    Depression    Seasonal allergies     History reviewed. No pertinent surgical history.  Social History   Socioeconomic History   Marital status: Married    Spouse name: Not on file   Number of children: Not on file   Years of education: Not on file   Highest education level: Not on file  Occupational History   Occupation: HR  Tobacco Use   Smoking status: Never   Smokeless tobacco: Never  Vaping Use   Vaping Use:  Never used  Substance and Sexual Activity   Alcohol use: Yes    Alcohol/week: 5.0 standard drinks    Types: 5 Cans of beer per week    Comment: weekends   Drug use: Never   Sexual activity: Not on file  Other Topics Concern   Not on file  Social History Narrative   Not on file   Social Determinants of Health   Financial Resource Strain: Not on file  Food Insecurity: Not on file  Transportation Needs: Not on file  Physical Activity: Not on file  Stress: Not on file  Social Connections: Not on file  Intimate Partner Violence: Not on file    No Known Allergies  Family History  Problem Relation Age of Onset   Breast cancer Mother    Thyroid disease Mother    Stroke Maternal Grandmother    Thyroid disease Maternal Grandmother    Colon cancer Maternal Grandfather    Neurologic Disorder Neg Hx    Multiple sclerosis Neg Hx     Prior to Admission medications   Medication Sig Start Date End Date Taking? Authorizing Provider  acetaminophen (TYLENOL) 500 MG tablet Take 1,000 mg by mouth every 6 (six) hours as needed for moderate pain or headache.   Yes [provider]  albuterol (PROVENTIL HFA;VENTOLIN HFA) 108 (90 Base) MCG/ACT inhaler Inhale 1 puff into the lungs every  6 (six) hours as needed for wheezing or shortness of breath.   Yes [provider]  beclomethasone (QVAR) 80 MCG/ACT inhaler Inhale 1 puff into the lungs daily.   Yes [provider]  cholecalciferol (VITAMIN D) 25 MCG (1000 UNIT) tablet Take 1,000 Units by mouth at bedtime.   Yes [provider]  famotidine (PEPCID) 20 MG tablet Take 1 tablet (20 mg total) by mouth 2 (two) times daily. Patient taking differently: Take 20 mg by mouth every evening. 07/10/15  Yes Noemi Chapel, MD  levocetirizine (XYZAL) 5 MG tablet Take 5 mg by mouth every evening. 06/19/21  Yes [provider]  OLOPATADINE HCL NA Place 2 sprays into both nostrils See admin instructions. Once or twice daily    Yes [provider]    Physical Exam: Vitals:   09/25/21 1300 09/25/21 1330 09/25/21 1500 09/25/21 1530  BP: 139/83  133/84 123/68  Pulse: 100 95 (!) 113 (!) 107  Resp: 19 20 (!) 25 20  Temp:      TempSrc:      SpO2: 97% 96% 98% 97%  Weight:      Height:         General:  Appears calm and comfortable and is in NAD Eyes:  PERRL, EOMI, normal lids, iris ENT:  grossly normal hearing, lips & tongue, mmm; appropriate dentition Neck:  no LAD, masses or thyromegaly Cardiovascular:  RR with mild tachycardia, no m/r/g. No LE edema.  Respiratory:   CTA bilaterally with no wheezes/rales/rhonchi.  Normal to mildly increased respiratory effort. Abdomen:  soft, NT, ND Skin:  no rash or induration seen on limited exam Musculoskeletal:  grossly normal tone BUE/BLE, good ROM, no bony abnormality Psychiatric:  blunted mood and affect, speech fluent and appropriate, AOx3 Neurologic:  CN 2-12 grossly intact, moves all extremities in coordinated fashion, sensation diminished in B LE more distally    Radiological Exams on Admission: Independently reviewed - see discussion in A/P where applicable  CT Head Wo Contrast  Result Date: 09/24/2021 CLINICAL DATA:  Numbness along the buttocks tingling in the feet and thighs. EXAM: CT HEAD WITHOUT CONTRAST TECHNIQUE: Contiguous axial images were obtained from the base of the skull through the vertex without intravenous contrast. COMPARISON:  None. FINDINGS: Brain: The brainstem, cerebellum, cerebral peduncles, thalami, basal ganglia, basilar cisterns, and ventricular system appear within normal limits. No intracranial hemorrhage, mass lesion, or acute CVA. Vascular: Unremarkable Skull: Unremarkable Sinuses/Orbits: Unremarkable Other: No supplemental non-categorized findings. IMPRESSION: 1. No significant abnormality is observed. Electronically Signed   By: Van Clines M.D.   On: 09/24/2021 21:36   CT Lumbar Spine Wo Contrast  Result Date:  09/24/2021 CLINICAL DATA:  Numbness along the buttocks and tingling sensation along the feet and thighs. EXAM: CT LUMBAR SPINE WITHOUT CONTRAST TECHNIQUE: Multidetector CT imaging of the lumbar spine was performed without intravenous contrast administration. Multiplanar CT image reconstructions were also generated. COMPARISON:  CT abdomen 08/03/2018 FINDINGS: Segmentation: The lowest lumbar type non-rib-bearing vertebra is labeled as L5. Alignment: No vertebral subluxation is observed. Vertebrae: The broad right transverse process of L5 mildly pseudo articulates with the sacrum. No fracture or acute bony abnormality is observed. Paraspinal and other soft tissues: We partially image the previously seen cyst along the falciform ligament in the liver. Disc levels: No significant findings above the L4-5 level. L4-5: Substantial central narrowing of the thecal sac with AP diameter of the thecal sac potentially narrowed to about 0.6 cm due to a central disc  protrusion. Roughly similar to the 08/03/2018 exam. L5-S1: No overt impingement, small central disc protrusion. Similar to the 08/03/2018 exam. IMPRESSION: 1. There is potentially substantial central narrowing of the thecal sac at the L4-5 level primarily due to a broad central disc protrusion, although this is roughly similar to the appearance on 08/03/2018. 2. No overt impingement at L5-S1 although there is probably a small central disc protrusion at this level as well. Electronically Signed   By: Van Clines M.D.   On: 09/24/2021 21:43   MR BRAIN W WO CONTRAST  Result Date: 09/25/2021 CLINICAL DATA:  Demyelinating disease. Acute onset of bilateral leg numbness. Abnormal gait. EXAM: MRI HEAD WITHOUT AND WITH CONTRAST TECHNIQUE: Multiplanar, multiecho pulse sequences of the brain and surrounding structures were obtained without and with intravenous contrast. CONTRAST:  72mL GADAVIST GADOBUTROL 1 MMOL/ML IV SOLN COMPARISON:  Head CT 09/24/2021 FINDINGS:  Brain: There is no evidence of an acute infarct, intracranial hemorrhage, mass, midline shift, or extra-axial fluid collection. The ventricles and sulci are normal. There are 15-20 small foci of T2 FLAIR hyperintensity in the cerebral white matter. These are most notable in the periventricular regions with some lesions oriented perpendicularly to the lateral ventricles including one in the right temporal lobe. There is involvement of the corpus callosum, and there are a few juxtacortical lesions. Some of these lesions demonstrate T2 shine through on diffusion-weighted imaging, however no truly restricted diffusion or abnormal enhancement is identified. The brainstem and cerebellum are normal in signal. There is a mildly enlarged partially empty sella. Vascular: Major intracranial vascular flow voids are preserved. Skull and upper cervical spine: Unremarkable bone marrow signal. Sinuses/Orbits: Unremarkable orbits. Paranasal sinuses and mastoid air cells are clear. Other: None. IMPRESSION: Cerebral white matter disease most consistent with multiple sclerosis. No evidence of active demyelination. Electronically Signed   By: Logan Bores M.D.   On: 09/25/2021 14:40   MR LUMBAR SPINE WO CONTRAST  Result Date: 09/25/2021 CLINICAL DATA:  Initial evaluation for acute low back pain, cauda equina syndrome suspected. Patient with lower extremity numbness, difficulty walking. EXAM: MRI LUMBAR SPINE WITHOUT CONTRAST TECHNIQUE: Multiplanar, multisequence MR imaging of the lumbar spine was performed. No intravenous contrast was administered. COMPARISON:  Prior CT from 09/24/2021. FINDINGS: Segmentation: Transitional features present about the lumbosacral junction with partial sacralization of the L5 vertebral body on the right. Alignment: Physiologic with preservation of the normal lumbar lordosis. No listhesis. Vertebrae: Vertebral body height maintained without acute or chronic fracture. Bone marrow signal intensity within  normal limits. Subcentimeter benign hemangioma noted within the T12 vertebral body. No other discrete or worrisome osseous lesions. No abnormal marrow edema. Conus medullaris and cauda equina: Conus extends to the L1 level. On sagittal T2 and STIR sequences, there is question of increased T2/stir signal within the partially visualized distal cord at the level of T11-12 (series 5, image 9 on sagittal T2 weighted sequence) (series 6, image 10 on sagittal STIR sequence). This area is not really included on axial images. Paraspinal and other soft tissues: Paraspinous soft tissues within normal limits. 3.9 cm intramural fibroid present at the right posterior uterine fundus/body. Visualized visceral structures otherwise unremarkable. Disc levels: L1-2:  Unremarkable. L2-3: Normal interspace. Minimal facet spurring. No canal or foraminal stenosis. L3-4: Normal interspace. Mild bilateral facet hypertrophy. No canal or foraminal stenosis. L4-5: Mild degenerative intervertebral disc space narrowing with diffuse disc bulge and disc desiccation. Superimposed small central disc protrusion with annular fissure indents the ventral thecal sac (series 8, image  33). Mild to moderate facet and ligament flavum hypertrophy. Resultant mild spinal stenosis. Foramina remain patent. No frank impingement. L5-S1: Shallow central disc protrusion indents the ventral thecal sac (series 8, image 37). No significant canal or lateral recess stenosis. Foramina remain patent. No impingement. IMPRESSION: 1. Question increased T2/STIR signal within the partially visualized distal cord at the level of T11-12 as above, indeterminate. While this finding could potentially be artifactual in nature given that this is seen at the margins of the provided images, possible myelitis or other intrinsic spinal cord process is difficult to exclude, particularly given the patient's history and symptoms. Further evaluation with dedicated MRI of the thoracic spine,  with and without contrast, recommended for further evaluation. 2. Small central disc protrusion with annular fissure and facet hypertrophy at L4-5 with resultant mild spinal stenosis. 3. Shallow central disc protrusion at L5-S1 without stenosis or impingement. 4. 3.9 cm intramural fibroid at the right posterior uterine fundus/body. Electronically Signed   By: Jeannine Boga M.D.   On: 09/25/2021 02:28   MR LUMBAR SPINE W CONTRAST  Result Date: 09/25/2021 CLINICAL DATA:  35 year old female with acute onset glove like extremity numbness 09/20/2021. Difficulty walking. Abnormal gait. No incontinence. No back or extremity pain. EXAM: MRI LUMBAR SPINE WITH CONTRAST TECHNIQUE: Multiplanar and multiecho pulse sequences of the lumbar spine were obtained with intravenous contrast. CONTRAST:  8mL GADAVIST GADOBUTROL 1 MMOL/ML IV SOLN in conjunction with contrast enhanced imaging of the cervical and thoracic spine reported separately. COMPARISON:  CT lumbar spine yesterday. Lumbar MRI without contrast 0121 hours today. FINDINGS: Segmentation: Normal on the comparison CT, with hypoplastic T12 ribs. Alignment:  Normal lumbar lordosis. Vertebrae: No evidence of marrow edema or enhancement. No acute osseous abnormality identified. Conus medullaris and cauda equina: Conus better demonstrated on the noncontrast MRI earlier today. But no abnormal lower thoracic spinal cord or conus enhancement. No lumbar dural thickening or abnormal intradural enhancement following contrast on this exam. Paraspinal and other soft tissues: Distended urinary bladder redemonstrated. Round 3.8 cm dorsal fibroid at the uterine fundus. Otherwise negative. Disc levels: L4-L5 disc degeneration better demonstrated on the noncontrast exam today. IMPRESSION: 1. No abnormal enhancement of the conus medullaris or cauda equina in association with the multifocal spinal cord lesions seen by cervical, thoracic, and the earlier noncontrast lumbar MRI today.  2. Fibroid uterus. Electronically Signed   By: Genevie Ann M.D.   On: 09/25/2021 06:24   MR Cervical Spine W or Wo Contrast  Result Date: 09/25/2021 CLINICAL DATA:  35 year old female with acute onset glove like extremity numbness 09/20/2021. Difficulty walking. Abnormal gait. No incontinence. No back or extremity pain. EXAM: MRI CERVICAL SPINE WITHOUT AND WITH CONTRAST TECHNIQUE: Multiplanar and multiecho pulse sequences of the cervical spine, to include the craniocervical junction and cervicothoracic junction, were obtained without and with intravenous contrast. CONTRAST:  81mL GADAVIST GADOBUTROL 1 MMOL/ML IV SOLN COMPARISON:  CT head and lumbar spine 09/24/2021. FINDINGS: Alignment: Preserved cervical lordosis.  No spondylolisthesis. Vertebrae: No marrow edema or evidence of acute osseous abnormality. Visualized bone marrow signal is within normal limits. Cord: Abnormal. There is a round, indistinct roughly 6-7 mm diameter area of abnormal T2 and STIR hyperintensity within the ventral cervical cord at the C3-C4 level (series 18, image 9 and series 19, image 21. The cord appears slightly expanded, but there is no associated enhancement. Caudal to that there is a questionable smaller more subtle and indistinct T2 hyperintense cord lesion at C7-T1. See series 16, image 19. No  cord expansion or enhancement. No myelomalacia. No dural thickening or abnormal intradural enhancement. Posterior Fossa, vertebral arteries, paraspinal tissues: Cervicomedullary junction is within normal limits. Negative visible posterior fossa. Preserved major vascular flow voids in the neck and at the skull base. The right vertebral artery appears dominant. Negative visible neck soft tissues. Negative visible upper chest. Disc levels: Negative. No age advanced or significant cervical spine degeneration. No spinal stenosis. No foraminal stenosis. No abnormal thickening or enhancement of the exiting cervical nerves. IMPRESSION: 1. Nonenhancing  T2/STIR hyperintense lesion within the ventral cervical cord at C3-C4, and possible additional smaller, indistinct nonenhancing lesion at C7-T1. No cord edema. Subtle cord expansion at the former. No myelomalacia. See also Thoracic Spine MRI today (reported separately). The constellation is most suspicious for Spinal Cord Demyelinating Disease, but other multifocal cord inflammation is possible (such as Neurosarcoidosis). Follow-up Brain MRI without and with contrast recommended. 2. No significant cervical spine degeneration. No spinal or neural foraminal stenosis. Electronically Signed   By: Genevie Ann M.D.   On: 09/25/2021 06:00   MR THORACIC SPINE W WO CONTRAST  Result Date: 09/25/2021 CLINICAL DATA:  35 year old female with acute onset glove like extremity numbness 09/20/2021. Difficulty walking. Abnormal gait. No incontinence. No back or extremity pain. EXAM: MRI THORACIC WITHOUT AND WITH CONTRAST TECHNIQUE: Multiplanar and multiecho pulse sequences of the thoracic spine were obtained without and with intravenous contrast. CONTRAST:  50mL GADAVIST GADOBUTROL 1 MMOL/ML IV SOLN in conjunction with contrast enhanced imaging of the cervical spine reported separately. COMPARISON:  Cervical spine MRI today. FINDINGS: Limited cervical spine imaging:  Detailed separately. Thoracic spine segmentation: Hypoplastic or absent ribs suspected at T12. Alignment: Thoracic kyphosis is within normal limits. No spondylolisthesis. No scoliosis. Vertebrae: Visualized bone marrow signal is within normal limits. No marrow edema or evidence of acute osseous abnormality. Cord: There is a degree of thoracic epidural lipomatosis diffusely, mildly effacing CSF from the thecal sac at many levels, maximal at T6. Sagittal STIR imaging demonstrates at least 4 STIR hyperintense spinal cord lesions with indistinct margins ranging from 6-12 mm in length from the T5 through the T10 cord level (series 6, image 10). Most of these are plainly  visible on sagittal T2 imaging also (series 4, image 10). But axial T2 images also demonstrate a smaller T11 level cord lesion with hyperintensity and indistinct margins on series 7, image 34. Following contrast there is faint curvilinear enhancement of only the T5 level lesion (series 10, image 9). No cord expansion or edema. The conus medullaris appears spared at T12-L1. No dural thickening or leptomeningeal enhancement. Paraspinal and other soft tissues: Negative. Negative visible thoracic and upper abdominal viscera. Disc levels: There is a tiny right paracentral thoracic disc protrusion at T7-T8 (series 7, image 23 and series 4, image 10). IMPRESSION: 1. Positive for at least five T2/STIR hyperintense thoracic spinal cord lesions with indistinct margins, ranging from 6 mm -12 mm in length, from the T5 through T11 cord levels. Faint enhancement of only the largest lesion at the T5 level. No cord edema or expansion. 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 2. Superimposed epidural lipomatosis, and tiny degenerative disc herniation at T7-T8. Electronically Signed   By: Genevie Ann M.D.   On: 09/25/2021 06:10    EKG: not done   Labs on Admission: I have personally reviewed the available labs and imaging studies at the time of the admission.  Pertinent  labs:   Unremarkable CMP WBC 12.6 Platelets 406 Pregnancy negative COVID/flu negative   Assessment/Plan Principal Problem:   Demyelinating disease of central nervous system, unspecified (HCC) Active Problems:   Morbid obesity ([place])   Asthma   New onset MS -Patient with probable new onset MS - presenting with predominantly sensory complaints >48 hours old with acute on chronic demyelination seen on MRI -Clinically consistent with MS and correlative with lesions appreciated on MRI -Will order NMO (Neuromyelitis optica autoab, IgG) -Will admit to  Med Tele -Will treat with 1 gram Solumedrol IV daily x 5 days -Needs empiric PPI while on steroids -Neurology consultation is appreciated -Physical/occupational therapy consults.   -Will need to consider lumbar puncture with Cell count with diff, gram stain+culture, protein, glucose, oligoclonal bands, IgG index after imaging has been performed.  May need to consult to IR for LP if neurology is unable to perform  Asthma/allergies -Continue Qvar, Xyzal, Albuterol  Obesity -Body mass index is 41.2 kg/m..  -Weight loss should be encouraged -Outpatient PCP/bariatric medicine/bariatric surgery f/u encouraged      Note: This patient has been tested and is negative for the novel coronavirus COVID-19. The patient has been fully vaccinated against COVID-19.   Level of care: Telemetry Medical DVT prophylaxis:  Lovenox  Code Status:  Full - confirmed with patient/family Family Communication: Husband was present throughout evaluation Disposition Plan:  The patient is from: home  Anticipated d/c is to: home without [facility] services   Anticipated d/c date will depend on clinical response to treatment, likely 5 days  Patient is currently: acutely ill Consults called: Neurology; PT/OT/Nutrition  Admission status:  Admit - It is my clinical opinion that admission to INPATIENT is reasonable and necessary because of the expectation that this patient will require hospital care that crosses at least 2 midnights to treat this condition based on the medical complexity of the problems presented.  Given the aforementioned information, the predictability of an adverse outcome is felt to be significant.    Karmen Bongo MD [facility]   How to contact the [facility] Attending or Consulting provider [place] or covering provider during after hours [place], for this patient?  Check the care team in [facility] and look for a) attending/consulting [facility] provider listed and b) the [facility] team listed Log into [email or web address]  and use [place]'s universal password to access. If you do not have the password, please contact the hospital operator. Locate the [facility] provider you are looking for under [facility] and page to a number that you can be directly reached. If you still have difficulty reaching the provider, please page the [facility] (Director on Call) for the Hospitalists listed on amion for assistance.   09/25/2021, 3:33 PM

## 2021-09-25 NOTE — ED Provider Notes (Signed)
Pt signed out by Dr. Randal Buba pending admission.  Pt has had numbness and difficulty walking since 9/29.  She was initially seen at [facility] and sent here for MRI.  Unfortunately, MRI shows possible spinal cord demyelinating disease.  Dr. Cheral Marker (neuro) wrote a consult and recommended admission to medicine for IV solumedrol and further work up.  Pt d/w Dr. Lorin Mercy ([facility]) for admission.   Isla Pence, MD 09/25/21 [phone]

## 2021-09-25 NOTE — ED Notes (Signed)
Received call from MRI requesting provider to place order for MRI lumbar spine with contrast. Provider made aware

## 2021-09-25 NOTE — ED Notes (Signed)
Patient transported to MRI 

## 2021-09-25 NOTE — ED Notes (Signed)
Remains in MRI at this time 

## 2021-09-25 NOTE — ED Notes (Signed)
Pt ambulatory to bathroom

## 2021-09-25 NOTE — Consult Note (Signed)
NEURO HOSPITALIST CONSULT NOTE   Requestig physician: Dr. Randal Buba  Reason for Consult: BLE numbness extending to hips in conjunction with BLE weakness  History obtained from:   Patient and Chart     HPI:                                                                                                                                          Jean Woods is an 35 y.o. female with acute onset of bilateral numbness involving her BLE from her toes to her waist, in addition to saddle anesthesia. Her symptoms began Friday on awakening with numbness and paresthesias involving the tips of all of the toes of each foot. This progressed to involve her lower legs below the knees, then spread to her thighs and hip region symmetrically. She also experienced decreased sensation to her saddle region, with diminished ability to perceive the passing of urine or stool, and also decreased sensation in these regions when wiping. Denies any bowel or bladder incontinence or dysuria. No constipation. Did endorse to the EDP some difficulty urinating, but denied this to Neurologist. Sense of numbness extends anteriorly and posteriorly to just below the panty line. Has had a sense of diffuse weakness and heaviness in both legs when walking, proximally and distally. No falls or legs giving out. No foot drop. States that there are no neurological symptoms above her hips including no arm weakness, no facial droop, no vision changes, no speech deficit, no sensory changes and no confusion.   She has never had any neurological symptoms before, except for an episode about 2 years ago during which vision in her left eye became blurry for about 4 hours. She went to an Ophthalmologist and per patient the findings were inconclusive, although she recalls that there was suspicion for a left sided cataract. She had also perceived some sensation of eye fullness or lid swelling at that time, but no eye pain.   Note from  today at [facility] was reviewed: "here with c/o acute onset 3-4 days bilateral whole leg numbness with tingling from the hips/lower lumbar to the toes, but now progressed to the more of the upper lumbar area, associated with mild vague overall discomfort but new worsening gait such that she now has to consciously watch her foot falls and raise her feet due to low sensation to each foot as well as mild subjective weakness.  No prior hx.  Has hx of anxiety but really only became nervous today with worsening symptoms and d/w her internist father who asked her to be seen. No falls, trauma, fever or other injury.  BP usually < 140/90 at home.  Works sitting most days, and no unusual activity recently."   Past Medical History:  Diagnosis Date  Asthma    Chicken pox    Depression    Seasonal allergies     History reviewed. No pertinent surgical history.  Family History  Problem Relation Age of Onset   Colon cancer Maternal Grandfather    Breast cancer Mother    Thyroid disease Mother    Stroke Maternal Grandmother    Thyroid disease Maternal Grandmother               Social History:  reports that she has never smoked. She has never used smokeless tobacco. She reports current alcohol use of about 5.0 standard drinks per week. She reports that she does not currently use drugs.  No Known Allergies  HOME MEDICATIONS:                                                                                                                      No current facility-administered medications on file prior to encounter.   Current Outpatient Medications on File Prior to Encounter  Medication Sig Dispense Refill   acetaminophen (TYLENOL) 500 MG tablet Take 1,000 mg by mouth every 6 (six) hours as needed for moderate pain or headache.     albuterol (PROVENTIL HFA;VENTOLIN HFA) 108 (90 Base) MCG/ACT inhaler Inhale 1 puff into the lungs every 6 (six) hours as needed for wheezing or shortness of breath.      beclomethasone (QVAR) 80 MCG/ACT inhaler Inhale 1 puff into the lungs daily.     cholecalciferol (VITAMIN D) 25 MCG (1000 UNIT) tablet Take 1,000 Units by mouth at bedtime.     famotidine (PEPCID) 20 MG tablet Take 1 tablet (20 mg total) by mouth 2 (two) times daily. (Patient taking differently: Take 20 mg by mouth every evening.) 30 tablet 0   levocetirizine (XYZAL) 5 MG tablet Take 5 mg by mouth every evening.     OLOPATADINE HCL NA Place 2 sprays into both nostrils See admin instructions. Once or twice daily      ROS:                                                                                                                                       Denies any neurological symptoms above the waist. No systemic complaints. No recent symptoms of infection. Other ROS as per HPI.    Blood pressure 138/90, pulse (!) 102, temperature 99.1 F (37.3 C), temperature source Oral, resp. rate  18, SpO2 100 %.   General Examination:                                                                                                       Physical Exam  HEENT-  [place]/AT  Lungs- Respirations unlabored Extremities- No edema  Neurological Examination Mental Status: Awake, alert and oriented. Speech fluent without evidence of aphasia.  Able to follow all commands without difficulty. Cranial Nerves: II: Visual fields intact bilaterally in all 4 quadrants. Subjective visual acuity normal OU. No extinction to DSS. PERRL.   III,IV, VI: No ptosis. EOMI. No nystagmus.  V,VII: Smile symmetric, facial temp sensation equal bilaterally VIII: hearing intact to conversation IX,X: No hypophonia XI: Symmetric shoulder shrug XII: Midline tongue extension Motor: BUE with 5/5 strength proximally and distally. No asymmetry RLE: 4+/5 hip flexion, 4+/5 knee extension, 5/5 ADF and APF LLE: 4+/5 hip flexion, 4+/5 knee extension, 5/5 ADF and APF Tone and bulk are normal  x 4.  No pronator drift.  Sensory: Temp and FT are  present but decreased to soles of feet bilaterally. Temp and FT sensation to dorsum of feet, ankles is not reduced, but is with associated dysesthesia to FT. Dysesthesia to FT also present in proximal portions of the lower legs the thighs and hips to the L1 level bilaterally.  Deep Tendon Reflexes: 2+ biceps and brachioradialis bilaterally. 1+ triceps bilaterally. 3+ patellar reflexes bilaterally. 2+ achilles reflexes bilaterally.  Toes downgoing bilaterally.  Negative Hoffman's sign bilaterally. Cerebellar: No ataxia with FNF bilaterally. No ataxia with lower extremity movements.  Gait: Deferred due to falls risk concerns   Lab Results: Basic Metabolic Panel: Recent Labs  Lab 09/24/21 1809  NA 138  K 3.9  CL 103  CO2 22  GLUCOSE 102*  BUN 10  CREATININE 0.66  CALCIUM 9.9    CBC: Recent Labs  Lab 09/24/21 1809  WBC 12.6*  NEUTROABS 10.9*  HGB 14.1  HCT 42.1  MCV 85.9  PLT 406*    Cardiac Enzymes: No results for input(s): CKTOTAL, CKMB, CKMBINDEX, TROPONINI in the last 168 hours.  Lipid Panel: No results for input(s): CHOL, TRIG, HDL, CHOLHDL, VLDL, LDLCALC in the last 168 hours.  Imaging: CT Head Wo Contrast  Result Date: 09/24/2021 CLINICAL DATA:  Numbness along the buttocks tingling in the feet and thighs. EXAM: CT HEAD WITHOUT CONTRAST TECHNIQUE: Contiguous axial images were obtained from the base of the skull through the vertex without intravenous contrast. COMPARISON:  None. FINDINGS: Brain: The brainstem, cerebellum, cerebral peduncles, thalami, basal ganglia, basilar cisterns, and ventricular system appear within normal limits. No intracranial hemorrhage, mass lesion, or acute CVA. Vascular: Unremarkable Skull: Unremarkable Sinuses/Orbits: Unremarkable Other: No supplemental non-categorized findings. IMPRESSION: 1. No significant abnormality is observed. Electronically Signed   By: Van Clines M.D.   On: 09/24/2021 21:36   CT Lumbar Spine Wo  Contrast  Result Date: 09/24/2021 CLINICAL DATA:  Numbness along the buttocks and tingling sensation along the feet and thighs. EXAM: CT LUMBAR SPINE WITHOUT CONTRAST TECHNIQUE: Multidetector CT imaging of the lumbar spine was  performed without intravenous contrast administration. Multiplanar CT image reconstructions were also generated. COMPARISON:  CT abdomen 08/03/2018 FINDINGS: Segmentation: The lowest lumbar type non-rib-bearing vertebra is labeled as L5. Alignment: No vertebral subluxation is observed. Vertebrae: The broad right transverse process of L5 mildly pseudo articulates with the sacrum. No fracture or acute bony abnormality is observed. Paraspinal and other soft tissues: We partially image the previously seen cyst along the falciform ligament in the liver. Disc levels: No significant findings above the L4-5 level. L4-5: Substantial central narrowing of the thecal sac with AP diameter of the thecal sac potentially narrowed to about 0.6 cm due to a central disc protrusion. Roughly similar to the 08/03/2018 exam. L5-S1: No overt impingement, small central disc protrusion. Similar to the 08/03/2018 exam. IMPRESSION: 1. There is potentially substantial central narrowing of the thecal sac at the L4-5 level primarily due to a broad central disc protrusion, although this is roughly similar to the appearance on 08/03/2018. 2. No overt impingement at L5-S1 although there is probably a small central disc protrusion at this level as well. Electronically Signed   By: Van Clines M.D.   On: 09/24/2021 21:43   MR LUMBAR SPINE WO CONTRAST  Result Date: 09/25/2021 CLINICAL DATA:  Initial evaluation for acute low back pain, cauda equina syndrome suspected. Patient with lower extremity numbness, difficulty walking. EXAM: MRI LUMBAR SPINE WITHOUT CONTRAST TECHNIQUE: Multiplanar, multisequence MR imaging of the lumbar spine was performed. No intravenous contrast was administered. COMPARISON:  Prior CT from  09/24/2021. FINDINGS: Segmentation: Transitional features present about the lumbosacral junction with partial sacralization of the L5 vertebral body on the right. Alignment: Physiologic with preservation of the normal lumbar lordosis. No listhesis. Vertebrae: Vertebral body height maintained without acute or chronic fracture. Bone marrow signal intensity within normal limits. Subcentimeter benign hemangioma noted within the T12 vertebral body. No other discrete or worrisome osseous lesions. No abnormal marrow edema. Conus medullaris and cauda equina: Conus extends to the L1 level. On sagittal T2 and STIR sequences, there is question of increased T2/stir signal within the partially visualized distal cord at the level of T11-12 (series 5, image 9 on sagittal T2 weighted sequence) (series 6, image 10 on sagittal STIR sequence). This area is not really included on axial images. Paraspinal and other soft tissues: Paraspinous soft tissues within normal limits. 3.9 cm intramural fibroid present at the right posterior uterine fundus/body. Visualized visceral structures otherwise unremarkable. Disc levels: L1-2:  Unremarkable. L2-3: Normal interspace. Minimal facet spurring. No canal or foraminal stenosis. L3-4: Normal interspace. Mild bilateral facet hypertrophy. No canal or foraminal stenosis. L4-5: Mild degenerative intervertebral disc space narrowing with diffuse disc bulge and disc desiccation. Superimposed small central disc protrusion with annular fissure indents the ventral thecal sac (series 8, image 33). Mild to moderate facet and ligament flavum hypertrophy. Resultant mild spinal stenosis. Foramina remain patent. No frank impingement. L5-S1: Shallow central disc protrusion indents the ventral thecal sac (series 8, image 37). No significant canal or lateral recess stenosis. Foramina remain patent. No impingement. IMPRESSION: 1. Question increased T2/STIR signal within the partially visualized distal cord at the  level of T11-12 as above, indeterminate. While this finding could potentially be artifactual in nature given that this is seen at the margins of the provided images, possible myelitis or other intrinsic spinal cord process is difficult to exclude, particularly given the patient's history and symptoms. Further evaluation with dedicated MRI of the thoracic spine, with and without contrast, recommended for further evaluation. 2. Small central  disc protrusion with annular fissure and facet hypertrophy at L4-5 with resultant mild spinal stenosis. 3. Shallow central disc protrusion at L5-S1 without stenosis or impingement. 4. 3.9 cm intramural fibroid at the right posterior uterine fundus/body. Electronically Signed   By: Jeannine Boga M.D.   On: 09/25/2021 02:28     Assessment: 35 year old female presenting with bilateral lower extremity numbness and weakness.  1. Exam reveals mild weakness of the BLE and dysesthesia to fine touch.  2. MRI lumbar spine reveals increased T2/STIR signal within the partially visualized distal cord at the levels of T11 and T12. While this finding could potentially be artifactual in nature given that this is seen at the margins of the provided images, possible myelitis or other intrinsic spinal cord process is difficult to exclude, particularly given the patient's history and symptoms. Mild disc protrusions are also seen, which are not associated with neural impingement.  3. DDx includes CNS demyelinating disease, subacute combined degeneration, compressive cord lesion and spinal dural AVF.    Recommendations: 1. MRI of thoracic and cervical spine with and without contrast.  2. Continue to monitor  Addendum: - MRI cervical spine w/wo contrast: Nonenhancing T2/STIR hyperintense lesion within the ventral cervical cord at C3-C4, and possible additional smaller, indistinct nonenhancing lesion at C7-T1. No cord edema. Subtle cord expansion at the former. No myelomalacia. The  constellation is most suspicious for Spinal Cord Demyelinating Disease, but other multifocal cord inflammation is possible (such as Neurosarcoidosis).   - MRI thoracic spine w/wo contrast: Positive for at least five T2/STIR hyperintense thoracic spinal cord lesions with indistinct margins, ranging from 6 mm -12 mm in length, from the T5 through T11 cord levels. Faint enhancement of only the largest lesion at the T5 level. No cord edema or expansion. 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 Also noted is superimposed epidural lipomatosis. - Highest on the DDx are NMO and MS. Unlikely to be sarcoidosis given no history of pulmonary symptoms and no cranial nerve findings on exam.  - Obtaining MRI brain with and without contrast (ordered) - Serum anit-NMO and anti-MOG antibodies (ordered) - IV methylprednisolone 1000 mg qd x 5 days (ordered)   Electronically signed: Dr. Kerney Elbe 09/25/2021, 2:46 AM

## 2021-09-25 NOTE — ED Provider Notes (Signed)
649 Case d/w Dr. Cheral Marker high dose steroids and admit to medicine.  See Dr. Yvetta Coder note   Randal Buba, Ciaira Natividad, MD 09/25/21 [phone]

## 2021-09-25 NOTE — ED Notes (Signed)
In MRI at this time

## 2021-09-26 LAB — CBC
HCT: 36.2 % (ref 36.0–46.0)
Hemoglobin: 12.1 g/dL (ref 12.0–15.0)
MCH: 28.5 pg (ref 26.0–34.0)
MCHC: 33.4 g/dL (ref 30.0–36.0)
MCV: 85.2 fL (ref 80.0–100.0)
Platelets: 306 10*3/uL (ref 150–400)
RBC: 4.25 MIL/uL (ref 3.87–5.11)
RDW: 14.6 % (ref 11.5–15.5)
WBC: 11.1 10*3/uL — ABNORMAL HIGH (ref 4.0–10.5)
nRBC: 0 % (ref 0.0–0.2)

## 2021-09-26 LAB — HIV ANTIBODY (ROUTINE TESTING W REFLEX): HIV Screen 4th Generation wRfx: NONREACTIVE

## 2021-09-26 LAB — BASIC METABOLIC PANEL
Anion gap: 9 (ref 5–15)
BUN: 9 mg/dL (ref 6–20)
CO2: 23 mmol/L (ref 22–32)
Calcium: 9.1 mg/dL (ref 8.9–10.3)
Chloride: 103 mmol/L (ref 98–111)
Creatinine, Ser: 0.63 mg/dL (ref 0.44–1.00)
GFR, Estimated: >60 mL/min (ref >60)
Glucose, Bld: 126 mg/dL — ABNORMAL HIGH (ref 70–99)
Potassium: 3.9 mmol/L (ref 3.5–5.1)
Sodium: 135 mmol/L (ref 135–145)

## 2021-09-26 MED ORDER — LORATADINE 10 MG PO TABS
10.0000 mg | ORAL_TABLET | Freq: Every day | ORAL | Status: DC | PRN
  Administered 2021-09-26: 10 mg via ORAL
  Filled 2021-09-26: qty 1

## 2021-09-26 MED ORDER — ZOLPIDEM TARTRATE 5 MG PO TABS: 5.0000 mg | ORAL_TABLET | Freq: Every evening | ORAL | Status: DC | PRN

## 2021-09-26 MED ORDER — SODIUM CHLORIDE 0.9 % IV SOLN: INTRAVENOUS | Status: DC

## 2021-09-26 MED ORDER — PANTOPRAZOLE SODIUM 40 MG PO TBEC
40.0000 mg | DELAYED_RELEASE_TABLET | Freq: Every day | ORAL | Status: DC
  Administered 2021-09-26 – 2021-09-29 (×4): 40 mg via ORAL
  Filled 2021-09-26 (×5): qty 1

## 2021-09-26 NOTE — Plan of Care (Signed)

## 2021-09-26 NOTE — Progress Notes (Signed)
[facility] PROGRESS NOTE    Progress Note  Jean Woods  WYO:[id number] DOB: [birth year] DOA: 09/24/2021 PCP: Hoyt Koch, MD     Brief Narrative:   Jean Woods is an 35 y.o. female past medical history significant with numbness of her buttock and tingling of her feet and thigh bilaterally went for a walk when this happened she related that she more she walk the more difficult became after this relation lost vision with blurriness which was due to allergies after she was evaluated by her physician.  She then started getting dizzy recently neurology was consulted who was concerned about demyelinating disease was started on steroids for 5 days MRI of the brain showed cerebral white matter disease consistent with multiple sclerosis no evidence of demyelination    Assessment/Plan:   Demyelinating disease of central nervous system, new onset MS: Clinically consistent with MS MRI brain and thoracic spine is also concerning debilitating disease. NMO antibiotics were sent. Neurology was consulted she was started on IV Solu-Medrol and PPI. Physical therapy has been consulted. Further management per neurology.  Asthma/allergy: Continue inhalers.  Obesity: Counseling. Estimated body mass index is 41.2 kg/m as calculated from the following:   Height as of this encounter: 5\' 4"  (1.626 m).   Weight as of this encounter: 108.9 kg.    DVT prophylaxis: lovenox Family Communication:none Status is: Inpatient  Remains inpatient appropriate because:Hemodynamically unstable  Dispo: The patient is from: Home              Anticipated d/c is to: Home              Patient currently is not medically stable to d/c.   Difficult to place patient No        Code Status:     Code Status Orders  (From admission, onward)           Start     Ordered   09/25/21 1021  Full code  Continuous        09/25/21 1021           Code Status History     This patient  has a current code status but no historical code status.         IV Access:   Peripheral IV   Procedures and diagnostic studies:   CT Head Wo Contrast  Result Date: 09/24/2021 CLINICAL DATA:  Numbness along the buttocks tingling in the feet and thighs. EXAM: CT HEAD WITHOUT CONTRAST TECHNIQUE: Contiguous axial images were obtained from the base of the skull through the vertex without intravenous contrast. COMPARISON:  None. FINDINGS: Brain: The brainstem, cerebellum, cerebral peduncles, thalami, basal ganglia, basilar cisterns, and ventricular system appear within normal limits. No intracranial hemorrhage, mass lesion, or acute CVA. Vascular: Unremarkable Skull: Unremarkable Sinuses/Orbits: Unremarkable Other: No supplemental non-categorized findings. IMPRESSION: 1. No significant abnormality is observed. Electronically Signed   By: Van Clines M.D.   On: 09/24/2021 21:36   CT Lumbar Spine Wo Contrast  Result Date: 09/24/2021 CLINICAL DATA:  Numbness along the buttocks and tingling sensation along the feet and thighs. EXAM: CT LUMBAR SPINE WITHOUT CONTRAST TECHNIQUE: Multidetector CT imaging of the lumbar spine was performed without intravenous contrast administration. Multiplanar CT image reconstructions were also generated. COMPARISON:  CT abdomen 08/03/2018 FINDINGS: Segmentation: The lowest lumbar type non-rib-bearing vertebra is labeled as L5. Alignment: No vertebral subluxation is observed. Vertebrae: The broad right transverse process of L5 mildly pseudo articulates with the sacrum. No fracture  or acute bony abnormality is observed. Paraspinal and other soft tissues: We partially image the previously seen cyst along the falciform ligament in the liver. Disc levels: No significant findings above the L4-5 level. L4-5: Substantial central narrowing of the thecal sac with AP diameter of the thecal sac potentially narrowed to about 0.6 cm due to a central disc protrusion. Roughly similar  to the 08/03/2018 exam. L5-S1: No overt impingement, small central disc protrusion. Similar to the 08/03/2018 exam. IMPRESSION: 1. There is potentially substantial central narrowing of the thecal sac at the L4-5 level primarily due to a broad central disc protrusion, although this is roughly similar to the appearance on 08/03/2018. 2. No overt impingement at L5-S1 although there is probably a small central disc protrusion at this level as well. Electronically Signed   By: Van Clines M.D.   On: 09/24/2021 21:43   MR BRAIN W WO CONTRAST  Result Date: 09/25/2021 CLINICAL DATA:  Demyelinating disease. Acute onset of bilateral leg numbness. Abnormal gait. EXAM: MRI HEAD WITHOUT AND WITH CONTRAST TECHNIQUE: Multiplanar, multiecho pulse sequences of the brain and surrounding structures were obtained without and with intravenous contrast. CONTRAST:  69mL GADAVIST GADOBUTROL 1 MMOL/ML IV SOLN COMPARISON:  Head CT 09/24/2021 FINDINGS: Brain: There is no evidence of an acute infarct, intracranial hemorrhage, mass, midline shift, or extra-axial fluid collection. The ventricles and sulci are normal. There are 15-20 small foci of T2 FLAIR hyperintensity in the cerebral white matter. These are most notable in the periventricular regions with some lesions oriented perpendicularly to the lateral ventricles including one in the right temporal lobe. There is involvement of the corpus callosum, and there are a few juxtacortical lesions. Some of these lesions demonstrate T2 shine through on diffusion-weighted imaging, however no truly restricted diffusion or abnormal enhancement is identified. The brainstem and cerebellum are normal in signal. There is a mildly enlarged partially empty sella. Vascular: Major intracranial vascular flow voids are preserved. Skull and upper cervical spine: Unremarkable bone marrow signal. Sinuses/Orbits: Unremarkable orbits. Paranasal sinuses and mastoid air cells are clear. Other: None.  IMPRESSION: Cerebral white matter disease most consistent with multiple sclerosis. No evidence of active demyelination. Electronically Signed   By: Logan Bores M.D.   On: 09/25/2021 14:40   MR LUMBAR SPINE WO CONTRAST  Result Date: 09/25/2021 CLINICAL DATA:  Initial evaluation for acute low back pain, cauda equina syndrome suspected. Patient with lower extremity numbness, difficulty walking. EXAM: MRI LUMBAR SPINE WITHOUT CONTRAST TECHNIQUE: Multiplanar, multisequence MR imaging of the lumbar spine was performed. No intravenous contrast was administered. COMPARISON:  Prior CT from 09/24/2021. FINDINGS: Segmentation: Transitional features present about the lumbosacral junction with partial sacralization of the L5 vertebral body on the right. Alignment: Physiologic with preservation of the normal lumbar lordosis. No listhesis. Vertebrae: Vertebral body height maintained without acute or chronic fracture. Bone marrow signal intensity within normal limits. Subcentimeter benign hemangioma noted within the T12 vertebral body. No other discrete or worrisome osseous lesions. No abnormal marrow edema. Conus medullaris and cauda equina: Conus extends to the L1 level. On sagittal T2 and STIR sequences, there is question of increased T2/stir signal within the partially visualized distal cord at the level of T11-12 (series 5, image 9 on sagittal T2 weighted sequence) (series 6, image 10 on sagittal STIR sequence). This area is not really included on axial images. Paraspinal and other soft tissues: Paraspinous soft tissues within normal limits. 3.9 cm intramural fibroid present at the right posterior uterine fundus/body. Visualized visceral structures  otherwise unremarkable. Disc levels: L1-2:  Unremarkable. L2-3: Normal interspace. Minimal facet spurring. No canal or foraminal stenosis. L3-4: Normal interspace. Mild bilateral facet hypertrophy. No canal or foraminal stenosis. L4-5: Mild degenerative intervertebral disc  space narrowing with diffuse disc bulge and disc desiccation. Superimposed small central disc protrusion with annular fissure indents the ventral thecal sac (series 8, image 33). Mild to moderate facet and ligament flavum hypertrophy. Resultant mild spinal stenosis. Foramina remain patent. No frank impingement. L5-S1: Shallow central disc protrusion indents the ventral thecal sac (series 8, image 37). No significant canal or lateral recess stenosis. Foramina remain patent. No impingement. IMPRESSION: 1. Question increased T2/STIR signal within the partially visualized distal cord at the level of T11-12 as above, indeterminate. While this finding could potentially be artifactual in nature given that this is seen at the margins of the provided images, possible myelitis or other intrinsic spinal cord process is difficult to exclude, particularly given the patient's history and symptoms. Further evaluation with dedicated MRI of the thoracic spine, with and without contrast, recommended for further evaluation. 2. Small central disc protrusion with annular fissure and facet hypertrophy at L4-5 with resultant mild spinal stenosis. 3. Shallow central disc protrusion at L5-S1 without stenosis or impingement. 4. 3.9 cm intramural fibroid at the right posterior uterine fundus/body. Electronically Signed   By: Jeannine Boga M.D.   On: 09/25/2021 02:28   MR LUMBAR SPINE W CONTRAST  Result Date: 09/25/2021 CLINICAL DATA:  35 year old female with acute onset glove like extremity numbness 09/20/2021. Difficulty walking. Abnormal gait. No incontinence. No back or extremity pain. EXAM: MRI LUMBAR SPINE WITH CONTRAST TECHNIQUE: Multiplanar and multiecho pulse sequences of the lumbar spine were obtained with intravenous contrast. CONTRAST:  33mL GADAVIST GADOBUTROL 1 MMOL/ML IV SOLN in conjunction with contrast enhanced imaging of the cervical and thoracic spine reported separately. COMPARISON:  CT lumbar spine yesterday.  Lumbar MRI without contrast 0121 hours today. FINDINGS: Segmentation: Normal on the comparison CT, with hypoplastic T12 ribs. Alignment:  Normal lumbar lordosis. Vertebrae: No evidence of marrow edema or enhancement. No acute osseous abnormality identified. Conus medullaris and cauda equina: Conus better demonstrated on the noncontrast MRI earlier today. But no abnormal lower thoracic spinal cord or conus enhancement. No lumbar dural thickening or abnormal intradural enhancement following contrast on this exam. Paraspinal and other soft tissues: Distended urinary bladder redemonstrated. Round 3.8 cm dorsal fibroid at the uterine fundus. Otherwise negative. Disc levels: L4-L5 disc degeneration better demonstrated on the noncontrast exam today. IMPRESSION: 1. No abnormal enhancement of the conus medullaris or cauda equina in association with the multifocal spinal cord lesions seen by cervical, thoracic, and the earlier noncontrast lumbar MRI today. 2. Fibroid uterus. Electronically Signed   By: Genevie Ann M.D.   On: 09/25/2021 06:24   MR Cervical Spine W or Wo Contrast  Result Date: 09/25/2021 CLINICAL DATA:  35 year old female with acute onset glove like extremity numbness 09/20/2021. Difficulty walking. Abnormal gait. No incontinence. No back or extremity pain. EXAM: MRI CERVICAL SPINE WITHOUT AND WITH CONTRAST TECHNIQUE: Multiplanar and multiecho pulse sequences of the cervical spine, to include the craniocervical junction and cervicothoracic junction, were obtained without and with intravenous contrast. CONTRAST:  43mL GADAVIST GADOBUTROL 1 MMOL/ML IV SOLN COMPARISON:  CT head and lumbar spine 09/24/2021. FINDINGS: Alignment: Preserved cervical lordosis.  No spondylolisthesis. Vertebrae: No marrow edema or evidence of acute osseous abnormality. Visualized bone marrow signal is within normal limits. Cord: Abnormal. There is a round, indistinct roughly 6-7 mm diameter  area of abnormal T2 and STIR hyperintensity  within the ventral cervical cord at the C3-C4 level (series 18, image 9 and series 19, image 21. The cord appears slightly expanded, but there is no associated enhancement. Caudal to that there is a questionable smaller more subtle and indistinct T2 hyperintense cord lesion at C7-T1. See series 16, image 19. No cord expansion or enhancement. No myelomalacia. No dural thickening or abnormal intradural enhancement. Posterior Fossa, vertebral arteries, paraspinal tissues: Cervicomedullary junction is within normal limits. Negative visible posterior fossa. Preserved major vascular flow voids in the neck and at the skull base. The right vertebral artery appears dominant. Negative visible neck soft tissues. Negative visible upper chest. Disc levels: Negative. No age advanced or significant cervical spine degeneration. No spinal stenosis. No foraminal stenosis. No abnormal thickening or enhancement of the exiting cervical nerves. IMPRESSION: 1. Nonenhancing T2/STIR hyperintense lesion within the ventral cervical cord at C3-C4, and possible additional smaller, indistinct nonenhancing lesion at C7-T1. No cord edema. Subtle cord expansion at the former. No myelomalacia. See also Thoracic Spine MRI today (reported separately). The constellation is most suspicious for Spinal Cord Demyelinating Disease, but other multifocal cord inflammation is possible (such as Neurosarcoidosis). Follow-up Brain MRI without and with contrast recommended. 2. No significant cervical spine degeneration. No spinal or neural foraminal stenosis. Electronically Signed   By: Genevie Ann M.D.   On: 09/25/2021 06:00   MR THORACIC SPINE W WO CONTRAST  Result Date: 09/25/2021 CLINICAL DATA:  36 year old female with acute onset glove like extremity numbness 09/20/2021. Difficulty walking. Abnormal gait. No incontinence. No back or extremity pain. EXAM: MRI THORACIC WITHOUT AND WITH CONTRAST TECHNIQUE: Multiplanar and multiecho pulse sequences of the  thoracic spine were obtained without and with intravenous contrast. CONTRAST:  31mL GADAVIST GADOBUTROL 1 MMOL/ML IV SOLN in conjunction with contrast enhanced imaging of the cervical spine reported separately. COMPARISON:  Cervical spine MRI today. FINDINGS: Limited cervical spine imaging:  Detailed separately. Thoracic spine segmentation: Hypoplastic or absent ribs suspected at T12. Alignment: Thoracic kyphosis is within normal limits. No spondylolisthesis. No scoliosis. Vertebrae: Visualized bone marrow signal is within normal limits. No marrow edema or evidence of acute osseous abnormality. Cord: There is a degree of thoracic epidural lipomatosis diffusely, mildly effacing CSF from the thecal sac at many levels, maximal at T6. Sagittal STIR imaging demonstrates at least 4 STIR hyperintense spinal cord lesions with indistinct margins ranging from 6-12 mm in length from the T5 through the T10 cord level (series 6, image 10). Most of these are plainly visible on sagittal T2 imaging also (series 4, image 10). But axial T2 images also demonstrate a smaller T11 level cord lesion with hyperintensity and indistinct margins on series 7, image 34. Following contrast there is faint curvilinear enhancement of only the T5 level lesion (series 10, image 9). No cord expansion or edema. The conus medullaris appears spared at T12-L1. No dural thickening or leptomeningeal enhancement. Paraspinal and other soft tissues: Negative. Negative visible thoracic and upper abdominal viscera. Disc levels: There is a tiny right paracentral thoracic disc protrusion at T7-T8 (series 7, image 23 and series 4, image 10). IMPRESSION: 1. Positive for at least five T2/STIR hyperintense thoracic spinal cord lesions with indistinct margins, ranging from 6 mm -12 mm in length, from the T5 through T11 cord levels. Faint enhancement of only the largest lesion at the T5 level. No cord edema or expansion. 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 2. Superimposed epidural lipomatosis, and tiny degenerative disc herniation at T7-T8. Electronically Signed   By: Genevie Ann M.D.   On: 09/25/2021 06:10     Medical Consultants:   None.   Subjective:    Athena Masse had labile emotion this morning.  Objective:    Vitals:   09/25/21 2300 09/26/21 0020 09/26/21 0025 09/26/21 0353  BP: 138/80 135/74  114/70  Pulse: (!) 104 (!) 104  92  Resp: (!) 21 (!) 21 18 17   Temp:  98.4 F (36.9 C)  98 F (36.7 C)  TempSrc:  Oral  Oral  SpO2: 97% 98%  98%  Weight:      Height:       SpO2: 98 %   Intake/Output Summary (Last 24 hours) at 09/26/2021 0918 Last data filed at 09/25/2021 1342 Gross per 24 hour  Intake 97.34 ml  Output --  Net 97.34 ml   Filed Weights   09/25/21 0303  Weight: 108.9 kg    Exam: General exam: In no acute distress. Respiratory system: Good air movement and clear to auscultation. Cardiovascular system: S1 & S2 heard, RRR. No JVD. Gastrointestinal system: Abdomen is nondistended, soft and nontender.  Extremities: No pedal edema. Skin: No rashes, lesions or ulcers Psychiatry: Judgement and insight appear normal. Mood & affect appropriate.    Data Reviewed:    Labs: Basic Metabolic Panel: Recent Labs  Lab 09/24/21 1809 09/26/21 0402  NA 138 135  K 3.9 3.9  CL 103 103  CO2 22 23  GLUCOSE 102* 126*  BUN 10 9  CREATININE 0.66 0.63  CALCIUM 9.9 9.1   GFR Estimated Creatinine Clearance: 116.1 mL/min (by C-G formula based on SCr of 0.63 mg/dL). Liver Function Tests: Recent Labs  Lab 09/24/21 1809  AST 14*  ALT 14  ALKPHOS 84  BILITOT 0.6  PROT 8.1  ALBUMIN 4.6   No results for input(s): LIPASE, AMYLASE in the last 168 hours. No results for input(s): AMMONIA in the last 168 hours. Coagulation profile No results for input(s): INR, PROTIME in the last 168 hours. COVID-19 Labs  No results  for input(s): DDIMER, FERRITIN, LDH, CRP in the last 72 hours.  Lab Results  Component Value Date   [facility] NEGATIVE 09/25/2021    CBC: Recent Labs  Lab 09/24/21 1809 09/26/21 0244  WBC 12.6* 11.1*  NEUTROABS 10.9*  --   HGB 14.1 12.1  HCT 42.1 36.2  MCV 85.9 85.2  PLT 406* 306   Cardiac Enzymes: No results for input(s): CKTOTAL, CKMB, CKMBINDEX, TROPONINI in the last 168 hours. BNP (last 3 results) No results for input(s): PROBNP in the last 8760 hours. CBG: No results for input(s): GLUCAP in the last 168 hours. D-Dimer: No results for input(s): DDIMER in the last 72 hours. Hgb A1c: No results for input(s): HGBA1C in the last 72 hours. Lipid Profile: No results for input(s): CHOL, HDL, LDLCALC, TRIG, CHOLHDL, LDLDIRECT in the last 72 hours. Thyroid function studies: No results for input(s): TSH, T4TOTAL, T3FREE, THYROIDAB in the last 72 hours.  Invalid input(s): FREET3 Anemia work up: No results for input(s): VITAMINB12, FOLATE, FERRITIN, TIBC, IRON, RETICCTPCT in the last 72 hours. Sepsis Labs: Recent Labs  Lab 09/24/21 1809 09/26/21 0244  WBC 12.6* 11.1*   Microbiology Recent Results (from the past 240 hour(s))  Resp Panel by RT-PCR (Flu A&B, Covid) Nasopharyngeal Swab     Status: None   Collection Time: 09/25/21  6:18  AM   Specimen: Nasopharyngeal Swab; Nasopharyngeal(NP) swabs in vial transport medium  Result Value Ref Range Status   SARS Coronavirus 2 by RT PCR NEGATIVE NEGATIVE Final    Comment: (NOTE) SARS-CoV-2 target nucleic acids are NOT DETECTED.  The SARS-CoV-2 RNA is generally detectable in upper respiratory specimens during the acute phase of infection. The lowest concentration of SARS-CoV-2 viral copies this assay can detect is 138 copies/mL. A negative result does not preclude SARS-Cov-2 infection and should not be used as the sole basis for treatment or other patient management decisions. A negative result may occur with  improper  specimen collection/handling, submission of specimen other than nasopharyngeal swab, presence of viral mutation(s) within the areas targeted by this assay, and inadequate number of viral copies(<138 copies/mL). A negative result must be combined with clinical observations, patient history, and epidemiological information. The expected result is Negative.  Fact Sheet for Patients:  EntrepreneurPulse.com.au  Fact Sheet for Healthcare Providers:  IncredibleEmployment.be  This test is no t yet approved or cleared by the [place] FDA and  has been authorized for detection and/or diagnosis of SARS-CoV-2 by FDA under an Emergency Use Authorization (EUA). This EUA will remain  in effect (meaning this test can be used) for the duration of the COVID-19 declaration under Section 564(b)(1) of the Act, 21 U.S.C.section 360bbb-3(b)(1), unless the authorization is terminated  or revoked sooner.       Influenza A by PCR NEGATIVE NEGATIVE Final   Influenza B by PCR NEGATIVE NEGATIVE Final    Comment: (NOTE) The Xpert Xpress SARS-CoV-2/FLU/RSV plus assay is intended as an aid in the diagnosis of influenza from Nasopharyngeal swab specimens and should not be used as a sole basis for treatment. Nasal washings and aspirates are unacceptable for Xpert Xpress SARS-CoV-2/FLU/RSV testing.  Fact Sheet for Patients: EntrepreneurPulse.com.au  Fact Sheet for Healthcare Providers: IncredibleEmployment.be  This test is not yet approved or cleared by the [place] FDA and has been authorized for detection and/or diagnosis of SARS-CoV-2 by FDA under an Emergency Use Authorization (EUA). This EUA will remain in effect (meaning this test can be used) for the duration of the COVID-19 declaration under Section 564(b)(1) of the Act, 21 U.S.C. section 360bbb-3(b)(1), unless the authorization is terminated or revoked.  Performed at  [facility], [place] [address] [place], [place] [address]      Medications:    budesonide (PULMICORT) nebulizer solution  0.25 mg Nebulization BID   cetirizine  5 mg Oral QPM   docusate sodium  100 mg Oral BID   enoxaparin (LOVENOX) injection  0.5 mg/kg Subcutaneous Q24H   famotidine  20 mg Oral QPM   pantoprazole  40 mg Oral Daily   sodium chloride flush  3 mL Intravenous Q12H   Continuous Infusions:  methylPREDNISolone (SOLU-MEDROL) injection Stopped (09/25/21 1342)      LOS: 1 day   Charlynne Cousins  [facility]  09/26/2021, 9:18 AM

## 2021-09-26 NOTE — Evaluation (Signed)
Physical Therapy Evaluation Patient Details Name: Jean Woods MRN: [id number] DOB: 1986-11-24 Today's Date: 09/26/2021  History of Present Illness  35 y.o. female presents to [facility] hospital on 09/24/2021 with numbness and tingling of BLE. Pt also reports a recent history of dizziness. MRI brain and spine from 09/25/2021 demonstrate cerebral white matter disease most consistent with multiple sclerosis. No significant PMH.  Clinical Impression  Pt presents to PT with deficits in sensation, balance, and gait. Pt reports reduced confidence in gait quality and balance due to sensation impairments in BLE. Pt proprioception intact during session and no buckling noted of either LE, with mild strength deficits noted of LLE. Pt will benefit from further acute PT services to continue progression of dynamic gait/balance challenges. PT recommends discharge home with medically ready, no PT follow-up.     Recommendations for follow up therapy are one component of a multi-disciplinary discharge planning process, led by the attending physician.  Recommendations may be updated based on patient status, additional functional criteria and insurance authorization.  Follow Up Recommendations No PT follow up    Equipment Recommendations  None recommended by PT    Recommendations for Other Services       Precautions / Restrictions Precautions Precautions: Fall Restrictions Weight Bearing Restrictions: No      Mobility  Bed Mobility Overal bed mobility: Modified Independent                  Transfers Overall transfer level: Needs assistance Equipment used: None Transfers: Sit to/from Stand Sit to Stand: Supervision         General transfer comment: supervision for safety only  Ambulation/Gait Ambulation/Gait assistance: Supervision Gait Distance (Feet): 300 Feet Assistive device: None Gait Pattern/deviations: Step-through pattern Gait velocity: functional Gait velocity interpretation:  >2.62 ft/sec, indicative of community ambulatory General Gait Details: pt with steady step-through gait, gait speed increases with increased distance  Stairs Stairs: Yes Stairs assistance: Supervision Stair Management: One rail Right;Step to pattern Number of Stairs: 8    Wheelchair Mobility    Modified Rankin (Stroke Patients Only)       Balance Overall balance assessment: Needs assistance Sitting-balance support: Feet supported Sitting balance-Leahy Scale: Normal     Standing balance support: No upper extremity supported;During functional activity Standing balance-Leahy Scale: Good                               Pertinent Vitals/Pain Pain Assessment: No/denies pain Faces Pain Scale: Hurts a little bit Pain Location: LEs Pain Descriptors / Indicators: Sore Pain Intervention(s): Monitored during session    Home Living Family/patient expects to be discharged to:: Private residence Living Arrangements: Spouse/significant other Available Help at Discharge: Family;Available 24 hours/day Type of Home: House Home Access: Stairs to enter Entrance Stairs-Rails: Left Entrance Stairs-Number of Steps: 4 Home Layout: Multi-level;Laundry or work area in [place]: Kasandra Knudsen - single point;Crutches      Prior Function Level of Independence: Independent         Comments: drives, work     Journalist, newspaper   Dominant Hand: Left    Extremity/Trunk Assessment   Upper Extremity Assessment Upper Extremity Assessment: Overall WFL for tasks assessed    Lower Extremity Assessment Lower Extremity Assessment: Defer to PT evaluation RLE Sensation: decreased light touch LLE Deficits / Details: grossly 4+/5 LLE Sensation: decreased light touch    Cervical / Trunk Assessment Cervical / Trunk Assessment: Normal  Communication  Communication: No difficulties  Cognition Arousal/Alertness: Awake/alert Behavior During Therapy: WFL for tasks  assessed/performed;Anxious Overall Cognitive Status: Within Functional Limits for tasks assessed                                 General Comments: mildly anxious about MD giving MS news      General Comments General comments (skin integrity, edema, etc.): VSS on RA    Exercises     Assessment/Plan    PT Assessment Patient needs continued PT services  PT Problem List Decreased balance;Impaired sensation       PT Treatment Interventions DME instruction;Gait training;Stair training;Functional mobility training;Therapeutic activities;Therapeutic exercise;Balance training;Neuromuscular re-education;Patient/family education    PT Goals (Current goals can be found in the Care Plan section)  Acute Rehab PT Goals Patient Stated Goal: to get more clarification PT Goal Formulation: With patient Time For Goal Achievement: 10/10/21 Potential to Achieve Goals: Good Additional Goals Additional Goal #1: Pt will score >19/24 on DGI to indicate a reduced risk for falls    Frequency Min 3X/week   Barriers to discharge        Co-evaluation               AM-PAC PT "6 Clicks" Mobility  Outcome Measure Help needed turning from your back to your side while in a flat bed without using bedrails?: None Help needed moving from lying on your back to sitting on the side of a flat bed without using bedrails?: None Help needed moving to and from a bed to a chair (including a wheelchair)?: None Help needed standing up from a chair using your arms (e.g., wheelchair or bedside chair)?: None Help needed to walk in hospital room?: A Little Help needed climbing 3-5 steps with a railing? : A Little 6 Click Score: 22    End of Session   Activity Tolerance: Patient tolerated treatment well Patient left: in bed;with call bell/phone within reach Nurse Communication: Mobility status PT Visit Diagnosis: Other symptoms and signs involving the nervous system (R29.898);Other abnormalities  of gait and mobility (R26.89)    Time: 7989-2119 PT Time Calculation (min) (ACUTE ONLY): 20 min   Charges:   PT Evaluation $PT Eval Low Complexity: 1 Low          Zenaida Niece, PT, DPT Acute Rehabilitation Pager: [phone]   Zenaida Niece 09/26/2021, 10:33 AM

## 2021-09-26 NOTE — Evaluation (Signed)
Occupational Therapy Evaluation Patient Details Name: Jean Woods MRN: [id number] DOB: [birth year] Today's Date: 09/26/2021   History of Present Illness 35 y.o. female presents to [facility] hospital on 09/24/2021 with numbness and tingling of BLE. Pt also reports a recent history of dizziness. MRI brain and spine from 09/25/2021 demonstrate cerebral white matter disease most consistent with multiple sclerosis. No significant PMH.   Clinical Impression   Jean Woods was indep prior to the above admission, working and driving; she lives in a split level home with her husband who can provide 24/7 support at d/c. Upon evaluation pt was supervision for all ADLs and mobility for safety only. She does not have any acute OT needs, recommend d/c to home with no follow up OT and support from family.      Recommendations for follow up therapy are one component of a multi-disciplinary discharge planning process, led by the attending physician.  Recommendations may be updated based on patient status, additional functional criteria and insurance authorization.   Follow Up Recommendations  No OT follow up;Supervision - Intermittent    Equipment Recommendations  None recommended by OT       Precautions / Restrictions Precautions Precautions: Fall Restrictions Weight Bearing Restrictions: No      Mobility Bed Mobility Overal bed mobility: Modified Independent                  Transfers Overall transfer level: Needs assistance Equipment used: None Transfers: Sit to/from Stand Sit to Stand: Supervision         General transfer comment: supervision for safety only    Balance Overall balance assessment: Needs assistance Sitting-balance support: Feet supported Sitting balance-Leahy Scale: Normal     Standing balance support: No upper extremity supported;During functional activity Standing balance-Leahy Scale: Good           ADL either performed or assessed with clinical judgement    ADL Overall ADL's : Needs assistance/impaired         General ADL Comments: All ADLs at supervision level this session for safety only. 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; she had 1 episode 1 year ago of blury vision, it resolved within a few hours     Perception Perception Perception Tested?: No    Pertinent Vitals/Pain Pain Assessment: No/denies pain     Hand Dominance Left   Extremity/Trunk Assessment     Lower Extremity Assessment Lower Extremity Assessment: Defer to PT evaluation   Cervical / Trunk Assessment Cervical / Trunk Assessment: Normal   Communication Communication Communication: No difficulties   Cognition Arousal/Alertness: Awake/alert Behavior During Therapy: WFL for tasks assessed/performed;Anxious Overall Cognitive Status: Within Functional Limits for tasks assessed           General Comments: mildly anxious about MD giving MS news   General Comments  VSS on RA     Home Living Family/patient expects to be discharged to:: Private residence Living Arrangements: Spouse/significant other Available Help at Discharge: Family;Available 24 hours/day Type of Home: House Home Access: Stairs to enter [employer] of Steps: 4 Entrance Stairs-Rails: Left Home Layout: Multi-level;Laundry or work area in Building surveyor of Steps: flight   Bathroom Shower/Tub: Tub/shower unit;Curtain;Walk-in  shower   Bathroom Toilet: Standard     Home Equipment: Cane - single point;Crutches          Prior Functioning/Environment Level of Independence: Independent        Comments:  drives, work        OT Problem List: Decreased knowledge of precautions      OT Treatment/Interventions:      OT Goals(Current goals can be found in the care plan section) Acute Rehab OT Goals Patient Stated Goal: to get more clarification OT Goal Formulation: All assessment and education complete, DC therapy   AM-PAC OT "6 Clicks" Daily Activity     Outcome Measure Help from another person eating meals?: None Help from another person taking care of personal grooming?: None Help from another person toileting, which includes using toliet, bedpan, or urinal?: None Help from another person bathing (including washing, rinsing, drying)?: A Little Help from another person to put on and taking off regular upper body clothing?: None Help from another person to put on and taking off regular lower body clothing?: None 6 Click Score: 23   End of Session    Activity Tolerance: Patient tolerated treatment well Patient left: in chair;with call bell/phone within reach  OT Visit Diagnosis: Other abnormalities of gait and mobility (R26.89)                Time: 2951-8841 OT Time Calculation (min): 18 min Charges:  OT General Charges $OT Visit: 1 Visit OT Evaluation $OT Eval Low Complexity: 1 Low   Declynn Lopresti A Daishon Chui 09/26/2021, 10:02 AM

## 2021-09-26 NOTE — Progress Notes (Signed)
Nutrition Brief Note  RD consulted for "nutritional goals" and due to pt's BMI >40. Discussed pt with RN.   Admitting Dx: Demyelinating disease of central nervous system, unspecified ([place]) [G37.9] Paresthesia [R20.2] Demyelinating disease ([place]) [G37.9] PMH:  Past Medical History:  Diagnosis Date   Asthma    Chicken pox    Depression    Seasonal allergies    Medications:  Scheduled Meds:  budesonide (PULMICORT) nebulizer solution  0.25 mg Nebulization BID   cetirizine  5 mg Oral QPM   docusate sodium  100 mg Oral BID   enoxaparin (LOVENOX) injection  0.5 mg/kg Subcutaneous Q24H   famotidine  20 mg Oral QPM   pantoprazole  40 mg Oral Daily   sodium chloride flush  3 mL Intravenous Q12H  Continuous Infusions:  sodium chloride 75 mL/hr at 09/26/21 1022   methylPREDNISolone (SOLU-MEDROL) injection 1,000 mg (09/26/21 1029)   Labs: Recent Labs  Lab 09/24/21 1809 09/26/21 0402  NA 138 135  K 3.9 3.9  CL 103 103  CO2 22 23  BUN 10 9  CREATININE 0.66 0.63  CALCIUM 9.9 9.1  GLUCOSE 102* 126*    Wt Readings from Last 15 Encounters:  09/25/21 108.9 kg  09/24/21 111.1 kg  06/28/21 115.7 kg  12/27/18 100.7 kg  12/26/18 100.8 kg  12/24/18 102.1 kg  08/03/18 99.8 kg  03/12/13 87.5 kg  02/12/13 88.9 kg  01/25/13 88.5 kg  12/25/12 88.4 kg  12/11/12 83.9 kg  Body mass index is 41.2 kg/m. Patient meets criteria for morbid obesity based on current BMI.   Current diet order is regular, patient is consuming approximately 75-100% of meals at this time. Pt denies changes to appetite/weight.  Obesity is a complex, chronic medical condition that is optimally managed by a multidisciplinary care team. Weight loss is not an ideal goal for an acute inpatient hospitalization. However, if further work-up for obesity is warranted, consider outpatient referral to outpatient bariatric service and/or [place]. No nutrition interventions warranted  at this time. If nutrition issues arise, please consult RD.    Larkin Ina, MS, RD, LDN (she/her/hers) RD pager number and weekend/on-call pager number located in [place].

## 2021-09-26 NOTE — ED Notes (Signed)
Pt resting on stretcher, denies any complaints. Pt requesting Zyzol for allergies and refused Zyrtec that is ordered, states it does not work. Msg sent to MD. No acute changes noted. Will continue to monitor.

## 2021-09-26 NOTE — Progress Notes (Addendum)
Neurology Progress Note  S: She is feeling better today. Her numbness and tingling is waxing and waning but overall better since steroids started. She continues to have some saddle anesthesia, but is voiding and having BMs. She is able to tell when she is voiding and when having a BM though she notes she has not yet had a BM although she is not eating much. She is walking to the bathroom unassisted. PT/OT notes reflect no need for further f/up or out patient f/up.  Tolerating steroids well without any psychiatric side effects  O: Current vital signs: BP (!) 97/48 (BP Location: Right Arm)   Pulse 65   Temp 98.8 F (37.1 C) (Oral)   Resp 14   Ht 5\' 4"  (1.626 m)   Wt 108.9 kg   SpO2 99%   BMI 41.20 kg/m  Vital signs in last 24 hours: Temp:  [98 F (36.7 C)-98.8 F (37.1 C)] 98.8 F (37.1 C) (10/05 1105) Pulse Rate:  [65-113] 65 (10/05 1105) Resp:  [14-26] 14 (10/05 1105) BP: (97-146)/(48-87) 97/48 (10/05 1105) SpO2:  [95 %-99 %] 99 % (10/05 1105)  GENERAL: Very well appearing female. Awake, alert in NAD. HEENT: Normocephalic and atraumatic. LUNGS: Normal respiratory effort.  CV: RRR. Ext: warm.  NEURO:  Mental Status: Alert and oriented times 4.  Speech/Language: speech is without aphasia or dysarthria.  Naming, repetition, fluency, and comprehension intact.  Cranial Nerves:  II: PERRL. Visual fields full.  III, IV, VI: EOMI. Eyelids elevate symmetrically.  V: Sensation is intact to light touch and symmetrical to face.  VII: Smile is symmetrical.  VIII: hearing intact to voice. IX, X: Palate elevates symmetrically. Phonation is normal.  [id number] shrug 5/5. XII: tongue is midline without fasciculations. Motor:  5/5 throughout.  Tone: is normal and bulk is normal for her weight Sensation- Intact to light touch bilaterally. Extinction absent DSS.    Coordination: FTN intact bilaterally, HKS: no ataxia in BLE. No drift.  DTRs:  2+ throughout.  Gait-  deferred.  Medications  Current Facility-Administered Medications:    0.9 %  sodium chloride infusion, , Intravenous, Continuous, Charlynne Cousins, MD, Last Rate: 75 mL/hr at 09/26/21 1022, New Bag at 09/26/21 1022   acetaminophen (TYLENOL) tablet 650 mg, 650 mg, Oral, Q6H PRN, 650 mg at 09/26/21 1011 **OR** acetaminophen (TYLENOL) suppository 650 mg, 650 mg, Rectal, Q6H PRN, Karmen Bongo, MD   albuterol (PROVENTIL) (2.5 MG/3ML) 0.083% nebulizer solution 3 mL, 3 mL, Inhalation, Q6H PRN, Karmen Bongo, MD   bisacodyl (DULCOLAX) EC tablet 5 mg, 5 mg, Oral, Daily PRN, Karmen Bongo, MD   budesonide (PULMICORT) nebulizer solution 0.25 mg, 0.25 mg, Nebulization, BID, Karmen Bongo, MD   cetirizine (ZYRTEC) tablet 5 mg, 5 mg, Oral, QPM, Karmen Bongo, MD   docusate sodium (COLACE) capsule 100 mg, 100 mg, Oral, BID, Karmen Bongo, MD, 100 mg at 09/26/21 0941   enoxaparin (LOVENOX) injection 55 mg, 0.5 mg/kg, Subcutaneous, Q24H, Karmen Bongo, MD, 55 mg at 09/26/21 0941   famotidine (PEPCID) tablet 20 mg, 20 mg, Oral, QPM, Karmen Bongo, MD, 20 mg at 09/25/21 1737   hydrALAZINE (APRESOLINE) injection 5 mg, 5 mg, Intravenous, Q4H PRN, Karmen Bongo, MD   HYDROcodone-acetaminophen (NORCO/VICODIN) 5-325 MG per tablet 1-2 tablet, 1-2 tablet, Oral, Q4H PRN, Karmen Bongo, MD   loratadine (CLARITIN) tablet 10 mg, 10 mg, Oral, Daily PRN, Charlynne Cousins, MD, 10 mg at 09/26/21 1145   methylPREDNISolone sodium succinate (SOLU-MEDROL) 1,000 mg in sodium chloride 0.9 %  50 mL IVPB, 1,000 mg, Intravenous, Daily, Karmen Bongo, MD, Last Rate: 66 mL/hr at 09/26/21 1029, 1,000 mg at 09/26/21 1029   morphine 2 MG/ML injection 2 mg, 2 mg, Intravenous, Q2H PRN, Karmen Bongo, MD   ondansetron [facility]) tablet 4 mg, 4 mg, Oral, Q6H PRN **OR** ondansetron (ZOFRAN) injection 4 mg, 4 mg, Intravenous, Q6H PRN, Karmen Bongo, MD   pantoprazole (PROTONIX) EC tablet 40 mg, 40 mg, Oral, Daily,  Charlynne Cousins, MD, 40 mg at 09/26/21 0941   polyethylene glycol (MIRALAX / GLYCOLAX) packet 17 g, 17 g, Oral, Daily PRN, Karmen Bongo, MD   sodium chloride flush (NS) 0.9 % injection 3 mL, 3 mL, Intravenous, Q12H, Karmen Bongo, MD, 3 mL at 09/26/21 1033   zolpidem (AMBIEN) tablet 5 mg, 5 mg, Oral, QHS PRN, Charlynne Cousins, MD   Basic Metabolic Panel: Recent Labs  Lab 09/24/21 1809 09/26/21 0402  NA 138 135  K 3.9 3.9  CL 103 103  CO2 22 23  GLUCOSE 102* 126*  BUN 10 9  CREATININE 0.66 0.63  CALCIUM 9.9 9.1    CBC: Recent Labs  Lab 09/24/21 1809 09/26/21 0244  WBC 12.6* 11.1*  NEUTROABS 10.9*  --   HGB 14.1 12.1  HCT 42.1 36.2  MCV 85.9 85.2  PLT 406* 306    Coagulation Studies: No results for input(s): LABPROT, INR in the last 72 hours.   Lab Results  Component Value Date   HGBA1C 5.1 06/28/2021     Imaging MD has reviewed images in epic and the results pertinent to this consultation are:  CT Head -There is potentially substantial central narrowing of the thecal sac at the L4-5 level primarily due to a broad central disc protrusion, although this is roughly similar to the appearance on 08/03/2018. - No overt impingement at L5-S1 although there is probably a small central disc protrusion at this level as well.  MRI Brain, C and T-spine personally reviewed, agree with radiology Brain:  -Cerebral white matter disease most consistent with multiple sclerosis. No evidence of active demyelination. C spine: -Non enhancing T2/STIR hyperintense lesion within the ventral cervical cord at C3-C4, and possible additional smaller, indistinct nonenhancing lesion at C7-T1. No cord edema. Subtle cord expansion at the former. No myelomalacia. -see also Thoracic Spine MRI today (reported separately). The constellation is most suspicious for Spinal Cord Demyelinating Disease, but other multifocal cord inflammation is possible (such  as Neurosarcoidosis). T spine: 1. Positive for at least five T2/STIR hyperintense thoracic spinal cord lesions with indistinct margins, ranging from 6 mm -12 mm in length, from the T5 through T11 cord levels. Faint enhancement of only the largest lesion at the T5 level. No cord edema or expansion. 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  2. Superimposed epidural lipomatosis, and tiny degenerative disc herniation at T7-T8.  Assessment: Patient meets McDonald criteria for multiple sclerosis and is responding well to steroid treatment.  I do not see an indication for lumbar puncture at this time given it would not change management.  Recommendations/Plan:  -Check TSH and Vitamin B12. Replete B12 if not over 500.  -Continue steroids, today is 2/5.  -Await antibody testing (anti NMO, Anti MOG Ab). -Monitor glucose in the setting of her obesity and pulse dose steroids, sliding scale insulin and glucose checks ordered -If patient's glucose is well controlled tomorrow, may be able to complete pulse dose steroids on an  outpatient basis  Pt seen by Clance Boll, MSN, APN-BC/Nurse Practitioner/Neuro and later by MD. Note and plan to be edited as needed by MD.  Pager: [phone]  Attending Neurologist's note:  I personally saw this patient, gathering history, performing a full neurologic examination, reviewing relevant labs, personally reviewing relevant imaging as detailed above, and formulated the assessment and plan, adding the note above for completeness and clarity to accurately reflect my thoughts  Lesleigh Noe MD-PhD [facility] [phone]  Available 7 AM to 7 PM, outside these hours please contact Neurologist on call listed on AMION   Greater than 35 minutes of care were spent with this patient today, greater than 50% at bedside.  Discussed with patient  presenting National MS Society website to start familiarizing herself with long-term treatment options as well as side effect profile of steroids and potential for completing treatment on an outpatient basis if she continues to do well with pulse dose steroids on an inpatient basis.  Additionally discussed exacerbation of symptoms with heat and importance of exercise

## 2021-09-27 LAB — GLUCOSE, CAPILLARY
Glucose-Capillary: 114 mg/dL — ABNORMAL HIGH (ref 70–99)
Glucose-Capillary: 90 mg/dL (ref 70–99)
Glucose-Capillary: 138 mg/dL — ABNORMAL HIGH (ref 70–99)
Glucose-Capillary: 121 mg/dL — ABNORMAL HIGH (ref 70–99)

## 2021-09-27 LAB — HEMOGLOBIN A1C
Hgb A1c MFr Bld: 5 % (ref 4.8–5.6)
Mean Plasma Glucose: 96.8 mg/dL

## 2021-09-27 MED ORDER — INSULIN ASPART 100 UNIT/ML IJ SOLN: 0.0000 [IU] | Freq: Three times a day (TID) | INTRAMUSCULAR | Status: DC

## 2021-09-27 MED ORDER — INSULIN ASPART 100 UNIT/ML IJ SOLN
0.0000 [IU] | Freq: Three times a day (TID) | INTRAMUSCULAR | Status: DC
  Administered 2021-09-27: 3 [IU] via SUBCUTANEOUS

## 2021-09-27 MED ORDER — INSULIN ASPART 100 UNIT/ML IJ SOLN: 0.0000 [IU] | Freq: Every day | INTRAMUSCULAR | Status: DC

## 2021-09-27 MED ORDER — INSULIN ASPART 100 UNIT/ML IJ SOLN
3.0000 [IU] | Freq: Three times a day (TID) | INTRAMUSCULAR | Status: DC
  Administered 2021-09-27 – 2021-09-28 (×5): 3 [IU] via SUBCUTANEOUS

## 2021-09-27 NOTE — Progress Notes (Addendum)
Neurology Progress Note  S: She is anxious about her blood pressure but otherwise has no acute complaints.  Continues have some slight difficulty with ambulation at times  O: Current vital signs: BP 133/79 (BP Location: Left Arm)   Pulse 84   Temp 98.1 F (36.7 C) (Oral)   Resp 17   Ht 5\' 4"  (1.626 m)   Wt 108.9 kg   SpO2 100%   BMI 41.20 kg/m  Vital signs in last 24 hours: Temp:  [98.1 F (36.7 C)-98.9 F (37.2 C)] 98.1 F (36.7 C) (10/06 1642) Pulse Rate:  [76-95] 84 (10/06 1642) Resp:  [14-19] 17 (10/06 1642) BP: (114-150)/(73-100) 133/79 (10/06 1642) SpO2:  [97 %-100 %] 100 % (10/06 1929)  GENERAL: Very well appearing female. Awake, alert in NAD. HEENT: Normocephalic and atraumatic. LUNGS: Normal respiratory effort.  CV: RRR. Ext: warm.  NEURO:  Mental Status: Alert and oriented times 4.  Speech/Language: speech is without aphasia or dysarthria.  Naming, repetition, fluency, and comprehension intact.  Cranial Nerves:  II: PERRL. Visual fields full.  III, IV, VI: EOMI. Eyelids elevate symmetrically.  V: Sensation is intact to light touch and symmetrical to face.  VII: Smile is symmetrical.  VIII: hearing intact to voice. IX, X: Palate elevates symmetrically. Phonation is normal.  AL:PFXTKWIO shrug 5/5. XII: tongue is midline without fasciculations. Motor:  5/5 throughout.  Tone: is normal and bulk is normal for her weight Sensation-reports improving sensory loss in the lower extremities, with resolution of tingling in the inner aspect of bilateral feet, though she continues to have significant numbness in her lower extremities Coordination: FTN intact bilaterally, HKS: no ataxia in BLE. No drift.  DTRs:  2+ throughout.  Gait- deferred.  Medications  Current Facility-Administered Medications:    acetaminophen (TYLENOL) tablet 650 mg, 650 mg, Oral, Q6H PRN, 650 mg at 09/27/21 1118 **OR** acetaminophen (TYLENOL) suppository 650 mg, 650 mg, Rectal, Q6H PRN,  Karmen Bongo, MD   albuterol (PROVENTIL) (2.5 MG/3ML) 0.083% nebulizer solution 3 mL, 3 mL, Inhalation, Q6H PRN, Karmen Bongo, MD   bisacodyl (DULCOLAX) EC tablet 5 mg, 5 mg, Oral, Daily PRN, Karmen Bongo, MD   budesonide (PULMICORT) nebulizer solution 0.25 mg, 0.25 mg, Nebulization, BID, Karmen Bongo, MD, 0.25 mg at 09/27/21 1929   cetirizine (ZYRTEC) tablet 5 mg, 5 mg, Oral, QPM, Karmen Bongo, MD, 5 mg at 09/27/21 1702   docusate sodium (COLACE) capsule 100 mg, 100 mg, Oral, BID, Karmen Bongo, MD, 100 mg at 09/27/21 1156   enoxaparin (LOVENOX) injection 55 mg, 0.5 mg/kg, Subcutaneous, Q24H, Karmen Bongo, MD, 55 mg at 09/27/21 1119   famotidine (PEPCID) tablet 20 mg, 20 mg, Oral, QPM, Karmen Bongo, MD, 20 mg at 09/27/21 1702   hydrALAZINE (APRESOLINE) injection 5 mg, 5 mg, Intravenous, Q4H PRN, Karmen Bongo, MD   HYDROcodone-acetaminophen (NORCO/VICODIN) 5-325 MG per tablet 1-2 tablet, 1-2 tablet, Oral, Q4H PRN, Karmen Bongo, MD   insulin aspart (novoLOG) injection 0-20 Units, 0-20 Units, Subcutaneous, TID WC, Charlynne Cousins, MD, 3 Units at 09/27/21 1702   insulin aspart (novoLOG) injection 0-5 Units, 0-5 Units, Subcutaneous, QHS, Charlynne Cousins, MD   insulin aspart (novoLOG) injection 3 Units, 3 Units, Subcutaneous, TID WC, Charlynne Cousins, MD, 3 Units at 09/27/21 1703   loratadine (CLARITIN) tablet 10 mg, 10 mg, Oral, Daily PRN, Charlynne Cousins, MD, 10 mg at 09/26/21 1145   methylPREDNISolone sodium succinate (SOLU-MEDROL) 1,000 mg in sodium chloride 0.9 % 50 mL IVPB, 1,000 mg, Intravenous, Daily,  Karmen Bongo, MD, Last Rate: 66 mL/hr at 09/27/21 1139, 1,000 mg at 09/27/21 1139   morphine 2 MG/ML injection 2 mg, 2 mg, Intravenous, Q2H PRN, Karmen Bongo, MD   ondansetron [facility]) tablet 4 mg, 4 mg, Oral, Q6H PRN **OR** ondansetron (ZOFRAN) injection 4 mg, 4 mg, Intravenous, Q6H PRN, Karmen Bongo, MD   pantoprazole (PROTONIX) EC tablet 40  mg, 40 mg, Oral, Daily, Charlynne Cousins, MD, 40 mg at 09/27/21 1119   polyethylene glycol (MIRALAX / GLYCOLAX) packet 17 g, 17 g, Oral, Daily PRN, Karmen Bongo, MD   sodium chloride flush (NS) 0.9 % injection 3 mL, 3 mL, Intravenous, Q12H, Karmen Bongo, MD, 3 mL at 09/27/21 1159   zolpidem (AMBIEN) tablet 5 mg, 5 mg, Oral, QHS PRN, Charlynne Cousins, MD   Basic Metabolic Panel: Recent Labs  Lab 09/24/21 1809 09/26/21 0402  NA 138 135  K 3.9 3.9  CL 103 103  CO2 22 23  GLUCOSE 102* 126*  BUN 10 9  CREATININE 0.66 0.63  CALCIUM 9.9 9.1     CBC: Recent Labs  Lab 09/24/21 1809 09/26/21 0244  WBC 12.6* 11.1*  NEUTROABS 10.9*  --   HGB 14.1 12.1  HCT 42.1 36.2  MCV 85.9 85.2  PLT 406* 306     Coagulation Studies: No results for input(s): LABPROT, INR in the last 72 hours.   Lab Results  Component Value Date   HGBA1C 5.0 09/27/2021     Imaging MD has reviewed images in epic and the results pertinent to this consultation are:  CT Head -There is potentially substantial central narrowing of the thecal sac at the L4-5 level primarily due to a broad central disc protrusion, although this is roughly similar to the appearance on 08/03/2018. - No overt impingement at L5-S1 although there is probably a small central disc protrusion at this level as well.  MRI Brain, C and T-spine personally reviewed, agree with radiology Brain:  -Cerebral white matter disease most consistent with multiple sclerosis. No evidence of active demyelination. C spine: -Non enhancing T2/STIR hyperintense lesion within the ventral cervical cord at C3-C4, and possible additional smaller, indistinct nonenhancing lesion at C7-T1. No cord edema. Subtle cord expansion at the former. No myelomalacia. -see also Thoracic Spine MRI today (reported separately). The constellation is most suspicious for Spinal Cord Demyelinating Disease, but other multifocal cord inflammation is possible  (such as Neurosarcoidosis). T spine: 1. Positive for at least five T2/STIR hyperintense thoracic spinal cord lesions with indistinct margins, ranging from 6 mm -12 mm in length, from the T5 through T11 cord levels. Faint enhancement of only the largest lesion at the T5 level. No cord edema or expansion. 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  2. Superimposed epidural lipomatosis, and tiny degenerative disc herniation at T7-T8.  Assessment: Patient meets McDonald criteria for multiple sclerosis and is responding well to steroid treatment, and is tolerating this well.  Recommendations/Plan:  -Check TSH and Vitamin B12. Replete B12 if not over 500.  -Continue steroids, today is 3/5.  -Await antibody testing (anti NMO, Anti MOG Ab --reordering as these were never collected -Patient prefers to complete steroids on an inpatient basis -Discussed with patient significant comorbidity of anxiety/depression with multiple sclerosis and offered SSRI initiation given her significant anxiety.  She preferred to defer to outpatient psychiatry -Ambulatory referral to Dr. Felecia Shelling, [facility], placed -Given no further neurological work-up  at this time, neurology will be available on an as-needed basis going forward.  Please do reach out if patient is worsening or new neurological questions arise  Lesleigh Noe MD-PhD [facility] [phone]  Available 7 AM to 7 PM, outside these hours please contact Neurologist on call listed on AMION   Greater than 35 minutes of care were spent with this patient today, greater than 50% at bedside.

## 2021-09-27 NOTE — Progress Notes (Signed)
[facility] PROGRESS NOTE    Progress Note  Jean Woods  SWF:[id number] DOB: October 11, 1986 DOA: 09/24/2021 PCP: Hoyt Koch, MD     Brief Narrative:   Jean Woods is an 35 y.o. female past medical history significant with numbness of her buttock and tingling of her feet and thigh bilaterally went for a walk when this happened she related that she more she walk the more difficult became after this relation lost vision with blurriness which was due to allergies after she was evaluated by her physician.  She then started getting dizzy recently neurology was consulted who was concerned about demyelinating disease was started on steroids for 5 days MRI of the brain showed cerebral white matter disease consistent with multiple sclerosis no evidence of demyelination   Assessment/Plan:   Demyelinating disease of central nervous system, new onset MS: Clinically consistent with MS MRI brain and thoracic spine is also concerning debilitating disease. NMO antibodies were sent. Neurology was consulted she was started on IV Solu-Medrol and PPI. Physical therapy recommended no PT. Blood glucose controlled, no hallucinations, appetite has remained stable. Labile emotion.  Asthma/allergy: Continue inhalers.  Obesity: Counseling. Estimated body mass index is 41.2 kg/m as calculated from the following:   Height as of this encounter: 5\' 4"  (1.626 m).   Weight as of this encounter: 108.9 kg.    DVT prophylaxis: lovenox Family Communication:none Status is: Inpatient  Remains inpatient appropriate because:Hemodynamically unstable  Dispo: The patient is from: Home              Anticipated d/c is to: Home              Patient currently is not medically stable to d/c.   Difficult to place patient No        Code Status:     Code Status Orders  (From admission, onward)           Start     Ordered   09/25/21 1021  Full code  Continuous        09/25/21 1021            Code Status History     This patient has a current code status but no historical code status.         IV Access:   Peripheral IV   Procedures and diagnostic studies:   MR BRAIN W WO CONTRAST  Result Date: 09/25/2021 CLINICAL DATA:  Demyelinating disease. Acute onset of bilateral leg numbness. Abnormal gait. EXAM: MRI HEAD WITHOUT AND WITH CONTRAST TECHNIQUE: Multiplanar, multiecho pulse sequences of the brain and surrounding structures were obtained without and with intravenous contrast. CONTRAST:  45mL GADAVIST GADOBUTROL 1 MMOL/ML IV SOLN COMPARISON:  Head CT 09/24/2021 FINDINGS: Brain: There is no evidence of an acute infarct, intracranial hemorrhage, mass, midline shift, or extra-axial fluid collection. The ventricles and sulci are normal. There are 15-20 small foci of T2 FLAIR hyperintensity in the cerebral white matter. These are most notable in the periventricular regions with some lesions oriented perpendicularly to the lateral ventricles including one in the right temporal lobe. There is involvement of the corpus callosum, and there are a few juxtacortical lesions. Some of these lesions demonstrate T2 shine through on diffusion-weighted imaging, however no truly restricted diffusion or abnormal enhancement is identified. The brainstem and cerebellum are normal in signal. There is a mildly enlarged partially empty sella. Vascular: Major intracranial vascular flow voids are preserved. Skull and upper cervical spine: Unremarkable bone marrow signal.  Sinuses/Orbits: Unremarkable orbits. Paranasal sinuses and mastoid air cells are clear. Other: None. IMPRESSION: Cerebral white matter disease most consistent with multiple sclerosis. No evidence of active demyelination. Electronically Signed   By: Logan Bores M.D.   On: 09/25/2021 14:40     Medical Consultants:   None.   Subjective:    Jean Woods more calm this morning.  Objective:    Vitals:   09/27/21  0002 09/27/21 0354 09/27/21 0800 09/27/21 0929  BP: 134/75 114/73 (!) 150/100   Pulse: 88 76 88   Resp: 14 19 16    Temp: 98.9 F (37.2 C) 98.7 F (37.1 C) 98.3 F (36.8 C)   TempSrc: Oral Oral Oral   SpO2: 97% 99% 99% 99%  Weight:      Height:       SpO2: 99 %   Intake/Output Summary (Last 24 hours) at 09/27/2021 0956 Last data filed at 09/27/2021 0900 Gross per 24 hour  Intake 410 ml  Output --  Net 410 ml    Filed Weights   09/25/21 0303  Weight: 108.9 kg    Exam: General exam: In no acute distress. Respiratory system: Good air movement and clear to auscultation. Cardiovascular system: S1 & S2 heard, RRR. No JVD. Gastrointestinal system: Abdomen is nondistended, soft and nontender.  Extremities: No pedal edema. Skin: No rashes, lesions or ulcers Psychiatry: Judgement and insight appear normal. Mood & affect appropriate.   Data Reviewed:    Labs: Basic Metabolic Panel: Recent Labs  Lab 09/24/21 1809 09/26/21 0402  NA 138 135  K 3.9 3.9  CL 103 103  CO2 22 23  GLUCOSE 102* 126*  BUN 10 9  CREATININE 0.66 0.63  CALCIUM 9.9 9.1    GFR Estimated Creatinine Clearance: 116.1 mL/min (by C-G formula based on SCr of 0.63 mg/dL). Liver Function Tests: Recent Labs  Lab 09/24/21 1809  AST 14*  ALT 14  ALKPHOS 84  BILITOT 0.6  PROT 8.1  ALBUMIN 4.6    No results for input(s): LIPASE, AMYLASE in the last 168 hours. No results for input(s): AMMONIA in the last 168 hours. Coagulation profile No results for input(s): INR, PROTIME in the last 168 hours. COVID-19 Labs  No results for input(s): DDIMER, FERRITIN, LDH, CRP in the last 72 hours.  Lab Results  Component Value Date   [place] NEGATIVE 09/25/2021    CBC: Recent Labs  Lab 09/24/21 1809 09/26/21 0244  WBC 12.6* 11.1*  NEUTROABS 10.9*  --   HGB 14.1 12.1  HCT 42.1 36.2  MCV 85.9 85.2  PLT 406* 306    Cardiac Enzymes: No results for input(s): CKTOTAL, CKMB, CKMBINDEX, TROPONINI  in the last 168 hours. BNP (last 3 results) No results for input(s): PROBNP in the last 8760 hours. CBG: Recent Labs  Lab 09/27/21 0614  GLUCAP 114*   D-Dimer: No results for input(s): DDIMER in the last 72 hours. Hgb A1c: Recent Labs    09/27/21 0256  HGBA1C 5.0   Lipid Profile: No results for input(s): CHOL, HDL, LDLCALC, TRIG, CHOLHDL, LDLDIRECT in the last 72 hours. Thyroid function studies: No results for input(s): TSH, T4TOTAL, T3FREE, THYROIDAB in the last 72 hours.  Invalid input(s): FREET3 Anemia work up: No results for input(s): VITAMINB12, FOLATE, FERRITIN, TIBC, IRON, RETICCTPCT in the last 72 hours. Sepsis Labs: Recent Labs  Lab 09/24/21 1809 09/26/21 0244  WBC 12.6* 11.1*    Microbiology Recent Results (from the past 240 hour(s))  Resp Panel by RT-PCR (Flu  A&B, Covid) Nasopharyngeal Swab     Status: None   Collection Time: 09/25/21  6:18 AM   Specimen: Nasopharyngeal Swab; Nasopharyngeal(NP) swabs in vial transport medium  Result Value Ref Range Status   SARS Coronavirus 2 by RT PCR NEGATIVE NEGATIVE Final    Comment: (NOTE) SARS-CoV-2 target nucleic acids are NOT DETECTED.  The SARS-CoV-2 RNA is generally detectable in upper respiratory specimens during the acute phase of infection. The lowest concentration of SARS-CoV-2 viral copies this assay can detect is 138 copies/mL. A negative result does not preclude SARS-Cov-2 infection and should not be used as the sole basis for treatment or other patient management decisions. A negative result may occur with  improper specimen collection/handling, submission of specimen other than nasopharyngeal swab, presence of viral mutation(s) within the areas targeted by this assay, and inadequate number of viral copies(<138 copies/mL). A negative result must be combined with clinical observations, patient history, and epidemiological information. The expected result is Negative.  Fact Sheet for Patients:   EntrepreneurPulse.com.au  Fact Sheet for Healthcare Providers:  IncredibleEmployment.be  This test is no t yet approved or cleared by the [place] FDA and  has been authorized for detection and/or diagnosis of SARS-CoV-2 by FDA under an Emergency Use Authorization (EUA). This EUA will remain  in effect (meaning this test can be used) for the duration of the COVID-19 declaration under Section 564(b)(1) of the Act, 21 U.S.C.section 360bbb-3(b)(1), unless the authorization is terminated  or revoked sooner.       Influenza A by PCR NEGATIVE NEGATIVE Final   Influenza B by PCR NEGATIVE NEGATIVE Final    Comment: (NOTE) The Xpert Xpress SARS-CoV-2/FLU/RSV plus assay is intended as an aid in the diagnosis of influenza from Nasopharyngeal swab specimens and should not be used as a sole basis for treatment. Nasal washings and aspirates are unacceptable for Xpert Xpress SARS-CoV-2/FLU/RSV testing.  Fact Sheet for Patients: EntrepreneurPulse.com.au  Fact Sheet for Healthcare Providers: IncredibleEmployment.be  This test is not yet approved or cleared by the [place] FDA and has been authorized for detection and/or diagnosis of SARS-CoV-2 by FDA under an Emergency Use Authorization (EUA). This EUA will remain in effect (meaning this test can be used) for the duration of the COVID-19 declaration under Section 564(b)(1) of the Act, 21 U.S.C. section 360bbb-3(b)(1), unless the authorization is terminated or revoked.  Performed at [facility], [place] [address], [place], [place] [address]      Medications:    budesonide (PULMICORT) nebulizer solution  0.25 mg Nebulization BID   cetirizine  5 mg Oral QPM   docusate sodium  100 mg Oral BID   enoxaparin (LOVENOX) injection  0.5 mg/kg Subcutaneous Q24H   famotidine  20 mg Oral QPM   insulin aspart  0-20 Units Subcutaneous TID WC   pantoprazole  40 mg  Oral Daily   sodium chloride flush  3 mL Intravenous Q12H   Continuous Infusions:  methylPREDNISolone (SOLU-MEDROL) injection 1,000 mg (09/26/21 1029)      LOS: 2 days   Charlynne Cousins  [facility]  09/27/2021, 9:56 AM

## 2021-09-28 LAB — CBC WITH DIFFERENTIAL/PLATELET
Abs Immature Granulocytes: 0.03 10*3/uL (ref 0.00–0.07)
Basophils Absolute: 0 10*3/uL (ref 0.0–0.1)
Basophils Relative: 0 %
Eosinophils Absolute: 0 10*3/uL (ref 0.0–0.5)
Eosinophils Relative: 0 %
HCT: 38.5 % (ref 36.0–46.0)
Hemoglobin: 12.6 g/dL (ref 12.0–15.0)
Immature Granulocytes: 0 %
Lymphocytes Relative: 13 %
Lymphs Abs: 1.3 10*3/uL (ref 0.7–4.0)
MCH: 28.6 pg (ref 26.0–34.0)
MCHC: 32.7 g/dL (ref 30.0–36.0)
MCV: 87.3 fL (ref 80.0–100.0)
Monocytes Absolute: 0.6 10*3/uL (ref 0.1–1.0)
Monocytes Relative: 6 %
Neutro Abs: 8 10*3/uL — ABNORMAL HIGH (ref 1.7–7.7)
Neutrophils Relative %: 81 %
Platelets: 344 10*3/uL (ref 150–400)
RBC: 4.41 MIL/uL (ref 3.87–5.11)
RDW: 14.7 % (ref 11.5–15.5)
WBC: 10 10*3/uL (ref 4.0–10.5)
nRBC: 0 % (ref 0.0–0.2)

## 2021-09-28 LAB — BASIC METABOLIC PANEL
Anion gap: 10 (ref 5–15)
BUN: 18 mg/dL (ref 6–20)
CO2: 24 mmol/L (ref 22–32)
Calcium: 9 mg/dL (ref 8.9–10.3)
Chloride: 103 mmol/L (ref 98–111)
Creatinine, Ser: 0.69 mg/dL (ref 0.44–1.00)
GFR, Estimated: >60 mL/min (ref >60)
Glucose, Bld: 115 mg/dL — ABNORMAL HIGH (ref 70–99)
Potassium: 3.9 mmol/L (ref 3.5–5.1)
Sodium: 137 mmol/L (ref 135–145)

## 2021-09-28 LAB — GLUCOSE, CAPILLARY
Glucose-Capillary: 90 mg/dL (ref 70–99)
Glucose-Capillary: 118 mg/dL — ABNORMAL HIGH (ref 70–99)
Glucose-Capillary: 114 mg/dL — ABNORMAL HIGH (ref 70–99)
Glucose-Capillary: 110 mg/dL — ABNORMAL HIGH (ref 70–99)

## 2021-09-28 NOTE — Progress Notes (Addendum)
Pt c/o a little more dizziness this shift w/o syncope. Dr. Tonie Griffith informed. DR. Cheral Marker informed as well.

## 2021-09-28 NOTE — Discharge Instructions (Signed)
Jean Woods was admitted to the Hospital on 09/24/2021 and Discharged on Discharge Date 09/28/2021 and should be excused from work/school   for 5  days starting 09/24/2021 , may return to work/school without any restrictions.  Call Bess Harvest MD, [place] [facility] [phone] with questions.  Charlynne Cousins M.D on 09/28/2021,at 2:33 PM  [facility] Office  [phone]

## 2021-09-28 NOTE — TOC Initial Note (Signed)
Transition of Care [facility]) - Initial/Assessment Note    Patient Details  Name: Jean Woods MRN: [id number] Date of Birth: 18-Sep-1986  [facility] CM/SW Contact:    Pollie Friar, RN Phone Number: 09/28/2021, 2:26 PM  Clinical Narrative:                 Patient is from home with significant other. PT has recommended outpatient therapy. Pt asked to attend at [facility]. Orders in Epic and information on the AVS.  Pt has needed transportation.  Also recommendations for a tub bench. Pt will purchase this outside the hospital setting.  TOC following.  Expected Discharge Plan: OP Rehab Barriers to Discharge: Continued Medical Work up   Patient Goals and CMS Choice   CMS Medicare.gov Compare Post Acute Care list provided to:: Patient Choice offered to / list presented to : Patient  Expected Discharge Plan and Services Expected Discharge Plan: OP Rehab   Discharge Planning Services: CM Consult   Living arrangements for the past 2 months: Single Family Home                                      Prior Living Arrangements/Services Living arrangements for the past 2 months: Single Family Home Lives with:: Significant Other Patient language and need for interpreter reviewed:: Yes Do you feel safe going back to the place where you live?: Yes        Care giver support system in place?: Yes (comment)   Criminal Activity/Legal Involvement Pertinent to Current Situation/Hospitalization: No - Comment as needed  Activities of Daily Living      Permission Sought/Granted                  Emotional Assessment Appearance:: Appears stated age Attitude/Demeanor/Rapport: Engaged Affect (typically observed): Accepting Orientation: : Oriented to Self, Oriented to Place, Oriented to  Time, Oriented to Situation   Psych Involvement: No (comment)  Admission diagnosis:  Demyelinating disease of central nervous system, unspecified ([place]) [G37.9] Paresthesia  [R20.2] Demyelinating disease ([place]) [G37.9] Patient Active Problem List   Diagnosis Date Noted   Demyelinating disease of central nervous system, unspecified ([place]) 09/25/2021   Paresthesia of both legs 09/24/2021   Elevated blood-pressure reading without diagnosis of hypertension 09/24/2021   Anxiety 09/24/2021   Asthma 06/29/2021   Family history of thyroid disease 06/29/2021   Cat allergies 03/12/2013   Morbid obesity ([place]) 12/25/2012   Hypercholesterolemia 12/25/2012   PCP:  Hoyt Koch, MD Pharmacy:   [id number] [address], [place] [address] Phone: [phone] Fax: [phone]  [id number] - [place], [place] [address] AT [address] [place] [address] Phone: [phone] Fax: [phone]     Social Determinants of Health ([place]) Interventions    Readmission Risk Interventions No flowsheet data found.

## 2021-09-28 NOTE — Progress Notes (Signed)
[facility] PROGRESS NOTE    Progress Note  Jean Woods  EPP:[id number] DOB: 25-Jun-1986 DOA: 09/24/2021 PCP: Hoyt Koch, MD     Brief Narrative:   Jean Woods is an 35 y.o. female past medical history significant with numbness of her buttock and tingling of her feet and thigh bilaterally went for a walk when this happened she related that she more she walk the more difficult became after this relation lost vision with blurriness which was due to allergies after she was evaluated by her physician.  She then started getting dizzy recently neurology was consulted who was concerned about demyelinating disease was started on steroids for 5 days MRI of the brain showed cerebral white matter disease consistent with multiple sclerosis no evidence of demyelination   Assessment/Plan:   Demyelinating disease of central nervous system, new onset MS: Clinically consistent with MS MRI brain and thoracic spine is also concerning debilitating disease. Continue Solu-Medrol for a total of 5 days. Physical therapy recommended no PT. Blood glucose no hallucinations and appetite remained stable Blood glucose controlled, no hallucinations, appetite has remained stable. Labile emotion.  Asthma/allergy: Continue inhalers.  Obesity: Counseling. Estimated body mass index is 41.2 kg/m as calculated from the following:   Height as of this encounter: 5\' 4"  (1.626 m).   Weight as of this encounter: 108.9 kg.    DVT prophylaxis: lovenox Family Communication:none Status is: Inpatient  Remains inpatient appropriate because:Hemodynamically unstable  Dispo: The patient is from: Home              Anticipated d/c is to: Home              Patient currently is not medically stable to d/c.   Difficult to place patient No        Code Status:     Code Status Orders  (From admission, onward)           Start     Ordered   09/25/21 1021  Full code  Continuous         09/25/21 1021           Code Status History     This patient has a current code status but no historical code status.         IV Access:   Peripheral IV   Procedures and diagnostic studies:   No results found.   Medical Consultants:   None.   Subjective:    Jean Woods more calm this morning.  Objective:    Vitals:   09/27/21 2014 09/27/21 2359 09/28/21 0326 09/28/21 0742  BP: (!) 143/85 129/82 123/71 130/83  Pulse: 90 75 65 74  Resp: 20 18 16 17   Temp: 98.6 F (37 C) 98.1 F (36.7 C) 98.3 F (36.8 C) 98.1 F (36.7 C)  TempSrc: Oral Oral Oral Oral  SpO2: 98% 97% 95% 97%  Weight:      Height:       SpO2: 97 %   Intake/Output Summary (Last 24 hours) at 09/28/2021 0843 Last data filed at 09/27/2021 1847 Gross per 24 hour  Intake 1060 ml  Output --  Net 1060 ml    Filed Weights   09/25/21 0303  Weight: 108.9 kg    Exam: General exam: In no acute distress. Respiratory system: Good air movement and clear to auscultation. Cardiovascular system: S1 & S2 heard, RRR. No JVD. Gastrointestinal system: Abdomen is nondistended, soft and nontender.  Extremities: No pedal edema. Skin:  No rashes, lesions or ulcers Psychiatry: Judgement and insight appear normal. Mood & affect appropriate.   Data Reviewed:    Labs: Basic Metabolic Panel: Recent Labs  Lab 09/24/21 1809 09/26/21 0402 09/28/21 0228  NA 138 135 137  K 3.9 3.9 3.9  CL 103 103 103  CO2 22 23 24   GLUCOSE 102* 126* 115*  BUN 10 9 18   CREATININE 0.66 0.63 0.69  CALCIUM 9.9 9.1 9.0    GFR Estimated Creatinine Clearance: 116.1 mL/min (by C-G formula based on SCr of 0.69 mg/dL). Liver Function Tests: Recent Labs  Lab 09/24/21 1809  AST 14*  ALT 14  ALKPHOS 84  BILITOT 0.6  PROT 8.1  ALBUMIN 4.6    No results for input(s): LIPASE, AMYLASE in the last 168 hours. No results for input(s): AMMONIA in the last 168 hours. Coagulation profile No results for input(s):  INR, PROTIME in the last 168 hours. COVID-19 Labs  No results for input(s): DDIMER, FERRITIN, LDH, CRP in the last 72 hours.  Lab Results  Component Value Date   Tye NEGATIVE 09/25/2021    CBC: Recent Labs  Lab 09/24/21 1809 09/26/21 0244 09/28/21 0228  WBC 12.6* 11.1* 10.0  NEUTROABS 10.9*  --  8.0*  HGB 14.1 12.1 12.6  HCT 42.1 36.2 38.5  MCV 85.9 85.2 87.3  PLT 406* 306 344    Cardiac Enzymes: No results for input(s): CKTOTAL, CKMB, CKMBINDEX, TROPONINI in the last 168 hours. BNP (last 3 results) No results for input(s): PROBNP in the last 8760 hours. CBG: Recent Labs  Lab 09/27/21 0614 09/27/21 1146 09/27/21 1646 09/27/21 2124 09/28/21 0611  GLUCAP 114* 90 138* 121* 90    D-Dimer: No results for input(s): DDIMER in the last 72 hours. Hgb A1c: Recent Labs    09/27/21 0256  HGBA1C 5.0    Lipid Profile: No results for input(s): CHOL, HDL, LDLCALC, TRIG, CHOLHDL, LDLDIRECT in the last 72 hours. Thyroid function studies: No results for input(s): TSH, T4TOTAL, T3FREE, THYROIDAB in the last 72 hours.  Invalid input(s): FREET3 Anemia work up: No results for input(s): VITAMINB12, FOLATE, FERRITIN, TIBC, IRON, RETICCTPCT in the last 72 hours. Sepsis Labs: Recent Labs  Lab 09/24/21 1809 09/26/21 0244 09/28/21 0228  WBC 12.6* 11.1* 10.0    Microbiology Recent Results (from the past 240 hour(s))  Resp Panel by RT-PCR (Flu A&B, Covid) Nasopharyngeal Swab     Status: None   Collection Time: 09/25/21  6:18 AM   Specimen: Nasopharyngeal Swab; Nasopharyngeal(NP) swabs in vial transport medium  Result Value Ref Range Status   SARS Coronavirus 2 by RT PCR NEGATIVE NEGATIVE Final    Comment: (NOTE) SARS-CoV-2 target nucleic acids are NOT DETECTED.  The SARS-CoV-2 RNA is generally detectable in upper respiratory specimens during the acute phase of infection. The lowest concentration of SARS-CoV-2 viral copies this assay can detect is 138 copies/mL.  A negative result does not preclude SARS-Cov-2 infection and should not be used as the sole basis for treatment or other patient management decisions. A negative result may occur with  improper specimen collection/handling, submission of specimen other than nasopharyngeal swab, presence of viral mutation(s) within the areas targeted by this assay, and inadequate number of viral copies(<138 copies/mL). A negative result must be combined with clinical observations, patient history, and epidemiological information. The expected result is Negative.  Fact Sheet for Patients:  EntrepreneurPulse.com.au  Fact Sheet for Healthcare Providers:  IncredibleEmployment.be  This test is no t yet approved or cleared by the  [place] [employer] and  has been authorized for detection and/or diagnosis of SARS-CoV-2 by FDA under an Print production planner (EUA). This EUA will remain  in effect (meaning this test can be used) for the duration of the COVID-19 declaration under Section 564(b)(1) of the Act, 21 U.S.C.section 360bbb-3(b)(1), unless the authorization is terminated  or revoked sooner.       Influenza A by PCR NEGATIVE NEGATIVE Final   Influenza B by PCR NEGATIVE NEGATIVE Final    Comment: (NOTE) The Xpert Xpress SARS-CoV-2/FLU/RSV plus assay is intended as an aid in the diagnosis of influenza from Nasopharyngeal swab specimens and should not be used as a sole basis for treatment. Nasal washings and aspirates are unacceptable for Xpert Xpress SARS-CoV-2/FLU/RSV testing.  Fact Sheet for Patients: EntrepreneurPulse.com.au  Fact Sheet for Healthcare Providers: IncredibleEmployment.be  This test is not yet approved or cleared by the [place] FDA and has been authorized for detection and/or diagnosis of SARS-CoV-2 by FDA under an Emergency Use Authorization (EUA). This EUA will remain in effect (meaning this test  can be used) for the duration of the COVID-19 declaration under Section 564(b)(1) of the Act, 21 U.S.C. section 360bbb-3(b)(1), unless the authorization is terminated or revoked.  Performed at [facility], [place] [address], [place], [place] [address]      Medications:    budesonide (PULMICORT) nebulizer solution  0.25 mg Nebulization BID   cetirizine  5 mg Oral QPM   docusate sodium  100 mg Oral BID   enoxaparin (LOVENOX) injection  0.5 mg/kg Subcutaneous Q24H   famotidine  20 mg Oral QPM   insulin aspart  0-20 Units Subcutaneous TID WC   insulin aspart  0-5 Units Subcutaneous QHS   insulin aspart  3 Units Subcutaneous TID WC   pantoprazole  40 mg Oral Daily   sodium chloride flush  3 mL Intravenous Q12H   Continuous Infusions:  methylPREDNISolone (SOLU-MEDROL) injection 1,000 mg (09/27/21 1139)      LOS: 3 days   Charlynne Cousins  [facility]  09/28/2021, 8:43 AM

## 2021-09-28 NOTE — Progress Notes (Addendum)
Physical Therapy Treatment Patient Details Name: Jean Woods MRN: [id number] DOB: [birth year] Today's Date: 09/28/2021   History of Present Illness 35 y.o. female presents to [facility] hospital on 09/24/2021 with numbness and tingling of BLE. Pt also reports a recent history of dizziness. MRI brain and spine from 09/25/2021 demonstrate cerebral white matter disease most consistent with multiple sclerosis. No significant PMH.    PT Comments    Pt received seated EOB, agreeable to therapy session and with good participation and tolerance for transfer and gait progression in hallway. Pt limited due to evolving symptoms of dizziness with longer distances (BP stable and no dizziness with parts of Dynamic Gait Index such as head turns and pivoting), so emphasis on activity pacing, seated exercises and energy conservation. Discharge recommendation and DME updated per discussion with supervising PT Ryan L and patient agreeable to OPPT upon DC to progress balance and activity tolerance. Pt too dizzy/fatigued to attempt steps this date, will plan to continue acute PT at least 1-2 more sessions to progress functional mobility and will need to perform stair trial prior to DC home.   Recommendations for follow up therapy are one component of a multi-disciplinary discharge planning process, led by the attending physician.  Recommendations may be updated based on patient status, additional functional criteria and insurance authorization.  Follow Up Recommendations  Outpatient PT     Equipment Recommendations  Other (comment) (tub transfer bench with back support)    Recommendations for Other Services       Precautions / Restrictions Precautions Precautions: Fall Precaution Comments: dizziness (BP stable) Restrictions Weight Bearing Restrictions: No     Mobility  Bed Mobility Overal bed mobility: Modified Independent    No physical assist to return to supine   Transfers Overall transfer level:  Modified independent Equipment used: None Transfers: Sit to/from Stand Sit to Stand: Supervision;Modified independent (Device/Increase time)      General transfer comment: modI, safe hand placement  Ambulation/Gait Ambulation/Gait assistance: Min guard;Supervision Gait Distance (Feet): 100 Feet (80ft, 133ft, 8ft x2 with seated breaks when too dizzy) Assistive device: None Gait Pattern/deviations: Step-through pattern;Decreased dorsiflexion - right;Decreased dorsiflexion - left;Decreased stride length Gait velocity: decreased; variable due to symptoms   General Gait Details: pt with steady step-through gait, pt hesitant this date due to evolving dizziness (sx improve with seated break, BP stable seated/standing) and endorsed fear of buckling with fatigue/further distances    Balance Overall balance assessment: Needs assistance Sitting-balance support: Feet supported Sitting balance-Leahy Scale: Normal     Standing balance support: No upper extremity supported;During functional activity Standing balance-Leahy Scale: Fair Standing balance comment: no LOB with DGI tasks but c/o dizziness and min guard with longer distances due to c/f buckling; pt keeping high guard position with BUE at times due to dizziness             High level balance activites: Direction changes;Turns;Sudden stops;Head turns High Level Balance Comments: pt without increased dizziness or LOB with head turns up/down and L/R rotation or pivot turn, but after >5 mins walking pt c/o dizziness and needed to take a seated break Standardized Balance Assessment Standardized Balance Assessment : Dynamic Gait Index   Dynamic Gait Index Level Surface: Mild Impairment Gait with Horizontal Head Turns: Normal Gait with Vertical Head Turns: Normal Gait and Pivot Turn: Mild Impairment Step Over Obstacle: Moderate Impairment Step Around Obstacles: Mild Impairment      Cognition Arousal/Alertness: Awake/alert Behavior  During Therapy: WFL for tasks assessed/performed;Anxious Overall Cognitive Status:  Within Functional Limits for tasks assessed       General Comments: mildly anxious re: mobility and unsure of activity tolerance, discussed energy conservation/activity pacing      Exercises Other Exercises Other Exercises: seated BLE AROM: LAQ, hip flexion x10-20 reps ea Other Exercises: STS x 5 reps reciprocal (using arms)    General Comments General comments (skin integrity, edema, etc.): SpO2/HR WFL on RA; BP 132/98 seated after walking (c/o dizziness) and BP 135/98 (standing, more dizzy). No dizziness with head turns or pivot, but increased sx with longer gait trials.      Pertinent Vitals/Pain Pain Assessment: Faces Faces Pain Scale: Hurts little more Pain Location: B knees and feet Pain Descriptors / Indicators: Sore;Tingling;Discomfort Pain Intervention(s): Limited activity within patient's tolerance;Monitored during session;Repositioned     PT Goals (current goals can now be found in the care plan section) Acute Rehab PT Goals Patient Stated Goal: to get more clarification PT Goal Formulation: With patient Time For Goal Achievement: 10/10/21 Progress towards PT goals: Progressing toward goals    Frequency    Min 3X/week      PT Plan Discharge plan needs to be updated;Equipment recommendations need to be updated       AM-PAC PT "6 Clicks" Mobility   Outcome Measure  Help needed turning from your back to your side while in a flat bed without using bedrails?: None Help needed moving from lying on your back to sitting on the side of a flat bed without using bedrails?: None Help needed moving to and from a bed to a chair (including a wheelchair)?: A Little Help needed standing up from a chair using your arms (e.g., wheelchair or bedside chair)?: A Little Help needed to walk in hospital room?: A Little Help needed climbing 3-5 steps with a railing? : A Lot 6 Click Score: 19     End of Session Equipment Utilized During Treatment: Gait belt Activity Tolerance: Patient tolerated treatment well;Other (comment) (dizziness limiting DGI tasks) Patient left: in bed;with call bell/phone within reach;with family/visitor present Nurse Communication: Mobility status PT Visit Diagnosis: Other symptoms and signs involving the nervous system (R29.898);Other abnormalities of gait and mobility (R26.89)     Time: 8657-8469 PT Time Calculation (min) (ACUTE ONLY): 26 min  Charges:  $Gait Training: 8-22 mins $Therapeutic Activity: 8-22 mins                     Cammie Faulstich P., PTA Acute Rehabilitation Services Pager: [phone] Office: [place] 09/28/2021, 2:03 PM

## 2021-09-29 LAB — CBC WITH DIFFERENTIAL/PLATELET
Abs Immature Granulocytes: 0.05 10*3/uL (ref 0.00–0.07)
Basophils Absolute: 0 10*3/uL (ref 0.0–0.1)
Basophils Relative: 0 %
Eosinophils Absolute: 0 10*3/uL (ref 0.0–0.5)
Eosinophils Relative: 0 %
HCT: 39.3 % (ref 36.0–46.0)
Hemoglobin: 13 g/dL (ref 12.0–15.0)
Immature Granulocytes: 0 %
Lymphocytes Relative: 17 %
Lymphs Abs: 1.9 10*3/uL (ref 0.7–4.0)
MCH: 28.6 pg (ref 26.0–34.0)
MCHC: 33.1 g/dL (ref 30.0–36.0)
MCV: 86.4 fL (ref 80.0–100.0)
Monocytes Absolute: 1.1 10*3/uL — ABNORMAL HIGH (ref 0.1–1.0)
Monocytes Relative: 10 %
Neutro Abs: 8.3 10*3/uL — ABNORMAL HIGH (ref 1.7–7.7)
Neutrophils Relative %: 73 %
Platelets: 338 10*3/uL (ref 150–400)
RBC: 4.55 MIL/uL (ref 3.87–5.11)
RDW: 14.5 % (ref 11.5–15.5)
WBC: 11.4 10*3/uL — ABNORMAL HIGH (ref 4.0–10.5)
nRBC: 0 % (ref 0.0–0.2)

## 2021-09-29 LAB — BASIC METABOLIC PANEL
Anion gap: 7 (ref 5–15)
BUN: 16 mg/dL (ref 6–20)
CO2: 22 mmol/L (ref 22–32)
Calcium: 8.9 mg/dL (ref 8.9–10.3)
Chloride: 105 mmol/L (ref 98–111)
Creatinine, Ser: 0.65 mg/dL (ref 0.44–1.00)
GFR, Estimated: >60 mL/min (ref >60)
Glucose, Bld: 101 mg/dL — ABNORMAL HIGH (ref 70–99)
Potassium: 3.9 mmol/L (ref 3.5–5.1)
Sodium: 134 mmol/L — ABNORMAL LOW (ref 135–145)

## 2021-09-29 LAB — GLUCOSE, CAPILLARY
Glucose-Capillary: 84 mg/dL (ref 70–99)
Glucose-Capillary: 92 mg/dL (ref 70–99)

## 2021-09-29 NOTE — Plan of Care (Signed)
  Problem: Education: Goal: Knowledge of General Education information will improve Description: Including pain rating scale, medication(s)/side effects and non-pharmacologic comfort measures Outcome: Progressing   Problem: Health Behavior/Discharge Planning: Goal: Ability to manage health-related needs will improve Outcome: Progressing   Problem: Clinical Measurements: Goal: Ability to maintain clinical measurements within normal limits will improve Outcome: Progressing Goal: Will remain free from infection Outcome: Progressing Goal: Respiratory complications will improve Outcome: Progressing Goal: Cardiovascular complication will be avoided Outcome: Progressing   Problem: Activity: Goal: Risk for activity intolerance will decrease Outcome: Progressing   Problem: Nutrition: Goal: Adequate nutrition will be maintained Outcome: Progressing   Problem: Coping: Goal: Level of anxiety will decrease Outcome: Progressing   Problem: Elimination: Goal: Will not experience complications related to bowel motility Outcome: Progressing   Problem: Pain Managment: Goal: General experience of comfort will improve Outcome: Progressing   Problem: Safety: Goal: Ability to remain free from injury will improve Outcome: Progressing   

## 2021-09-29 NOTE — Discharge Summary (Signed)
Physician Discharge Summary  Jean Woods OHY:[id number] DOB: 15-Dec-1986 DOA: 09/24/2021  PCP: Hoyt Koch, MD  Admit date: 09/24/2021 Discharge date: 09/29/2021  Admitted From: Home Disposition:  Home  Recommendations for Outpatient Follow-up:  Follow up with Neurology in 1-2 weeks Please obtain BMP/CBC in one week   Home Health:No Equipment/Devices:None  Discharge Condition:Stable CODE STATUS:Full Diet recommendation: Heart Healthy   Brief/Interim Summary: 35 y.o. female past medical history significant with numbness of her buttock and tingling of her feet and thigh bilaterally went for a walk when this happened she related that she more she walk the more difficult became after this relation lost vision with blurriness which was due to allergies after she was evaluated by her physician.  She then started getting dizzy recently neurology was consulted who was concerned about demyelinating disease was started on steroids for 5 days MRI of the brain showed cerebral white matter disease consistent with multiple sclerosis no evidence of demyelination  Discharge Diagnoses:  Principal Problem:   Demyelinating disease of central nervous system, unspecified ([place]) Active Problems:   Morbid obesity ([place])   Asthma  Newly diagnosed multiple sclerosis: Neurology was consulted and recommended high-dose IV steroids and PPI. Physical therapy evaluated the patient and recommended home health PT. She finished her course of steroids in house and she will follow-up with neurology in 1 to 2 weeks.  Asthma/allergies: Which is a major medication continue inhalers.  Obesity: Counseling.  Discharge Instructions  Discharge Instructions     Ambulatory referral to Neurology   Complete by: As directed    An appointment is requested in approximately: 4 weeks   Ambulatory referral to Physical Therapy   Complete by: As directed    Diet - low sodium heart healthy   Complete by: As  directed    Increase activity slowly   Complete by: As directed       Allergies as of 09/29/2021   No Known Allergies      Medication List     TAKE these medications    acetaminophen 500 MG tablet Commonly known as: TYLENOL Take 1,000 mg by mouth every 6 (six) hours as needed for moderate pain or headache.   albuterol 108 (90 Base) MCG/ACT inhaler Commonly known as: VENTOLIN HFA Inhale 1 puff into the lungs every 6 (six) hours as needed for wheezing or shortness of breath.   beclomethasone 80 MCG/ACT inhaler Commonly known as: QVAR Inhale 1 puff into the lungs daily.   cholecalciferol 25 MCG (1000 UNIT) tablet Commonly known as: VITAMIN D Take 1,000 Units by mouth at bedtime.   famotidine 20 MG tablet Commonly known as: Pepcid Take 1 tablet (20 mg total) by mouth 2 (two) times daily. What changed: when to take this   levocetirizine 5 MG tablet Commonly known as: XYZAL Take 5 mg by mouth every evening.   OLOPATADINE HCL NA Place 2 sprays into both nostrils See admin instructions. Once or twice daily        Follow-up Information     [facility] Follow up.   Specialty: Rehabilitation Why: If you have not received a call to schedule an appointment within 24 hours of discharge please call and schedule Contact information: [address] [id number] [place] [phone]               No Known Allergies  Consultations: Neurology   Procedures/Studies: CT Head Wo Contrast  Result Date: 09/24/2021 CLINICAL DATA:  Numbness along the  buttocks tingling in the feet and thighs. EXAM: CT HEAD WITHOUT CONTRAST TECHNIQUE: Contiguous axial images were obtained from the base of the skull through the vertex without intravenous contrast. COMPARISON:  None. FINDINGS: Brain: The brainstem, cerebellum, cerebral peduncles, thalami, basal ganglia, basilar cisterns, and ventricular system appear within  normal limits. No intracranial hemorrhage, mass lesion, or acute CVA. Vascular: Unremarkable Skull: Unremarkable Sinuses/Orbits: Unremarkable Other: No supplemental non-categorized findings. IMPRESSION: 1. No significant abnormality is observed. Electronically Signed   By: Van Clines M.D.   On: 09/24/2021 21:36   CT Lumbar Spine Wo Contrast  Result Date: 09/24/2021 CLINICAL DATA:  Numbness along the buttocks and tingling sensation along the feet and thighs. EXAM: CT LUMBAR SPINE WITHOUT CONTRAST TECHNIQUE: Multidetector CT imaging of the lumbar spine was performed without intravenous contrast administration. Multiplanar CT image reconstructions were also generated. COMPARISON:  CT abdomen 08/03/2018 FINDINGS: Segmentation: The lowest lumbar type non-rib-bearing vertebra is labeled as L5. Alignment: No vertebral subluxation is observed. Vertebrae: The broad right transverse process of L5 mildly pseudo articulates with the sacrum. No fracture or acute bony abnormality is observed. Paraspinal and other soft tissues: We partially image the previously seen cyst along the falciform ligament in the liver. Disc levels: No significant findings above the L4-5 level. L4-5: Substantial central narrowing of the thecal sac with AP diameter of the thecal sac potentially narrowed to about 0.6 cm due to a central disc protrusion. Roughly similar to the 08/03/2018 exam. L5-S1: No overt impingement, small central disc protrusion. Similar to the 08/03/2018 exam. IMPRESSION: 1. There is potentially substantial central narrowing of the thecal sac at the L4-5 level primarily due to a broad central disc protrusion, although this is roughly similar to the appearance on 08/03/2018. 2. No overt impingement at L5-S1 although there is probably a small central disc protrusion at this level as well. Electronically Signed   By: Van Clines M.D.   On: 09/24/2021 21:43   MR BRAIN W WO CONTRAST  Result Date: 09/25/2021 CLINICAL  DATA:  Demyelinating disease. Acute onset of bilateral leg numbness. Abnormal gait. EXAM: MRI HEAD WITHOUT AND WITH CONTRAST TECHNIQUE: Multiplanar, multiecho pulse sequences of the brain and surrounding structures were obtained without and with intravenous contrast. CONTRAST:  54mL GADAVIST GADOBUTROL 1 MMOL/ML IV SOLN COMPARISON:  Head CT 09/24/2021 FINDINGS: Brain: There is no evidence of an acute infarct, intracranial hemorrhage, mass, midline shift, or extra-axial fluid collection. The ventricles and sulci are normal. There are 15-20 small foci of T2 FLAIR hyperintensity in the cerebral white matter. These are most notable in the periventricular regions with some lesions oriented perpendicularly to the lateral ventricles including one in the right temporal lobe. There is involvement of the corpus callosum, and there are a few juxtacortical lesions. Some of these lesions demonstrate T2 shine through on diffusion-weighted imaging, however no truly restricted diffusion or abnormal enhancement is identified. The brainstem and cerebellum are normal in signal. There is a mildly enlarged partially empty sella. Vascular: Major intracranial vascular flow voids are preserved. Skull and upper cervical spine: Unremarkable bone marrow signal. Sinuses/Orbits: Unremarkable orbits. Paranasal sinuses and mastoid air cells are clear. Other: None. IMPRESSION: Cerebral white matter disease most consistent with multiple sclerosis. No evidence of active demyelination. Electronically Signed   By: Logan Bores M.D.   On: 09/25/2021 14:40   MR LUMBAR SPINE WO CONTRAST  Result Date: 09/25/2021 CLINICAL DATA:  Initial evaluation for acute low back pain, cauda equina syndrome suspected. Patient with lower extremity numbness,  difficulty walking. EXAM: MRI LUMBAR SPINE WITHOUT CONTRAST TECHNIQUE: Multiplanar, multisequence MR imaging of the lumbar spine was performed. No intravenous contrast was administered. COMPARISON:  Prior CT from  09/24/2021. FINDINGS: Segmentation: Transitional features present about the lumbosacral junction with partial sacralization of the L5 vertebral body on the right. Alignment: Physiologic with preservation of the normal lumbar lordosis. No listhesis. Vertebrae: Vertebral body height maintained without acute or chronic fracture. Bone marrow signal intensity within normal limits. Subcentimeter benign hemangioma noted within the T12 vertebral body. No other discrete or worrisome osseous lesions. No abnormal marrow edema. Conus medullaris and cauda equina: Conus extends to the L1 level. On sagittal T2 and STIR sequences, there is question of increased T2/stir signal within the partially visualized distal cord at the level of T11-12 (series 5, image 9 on sagittal T2 weighted sequence) (series 6, image 10 on sagittal STIR sequence). This area is not really included on axial images. Paraspinal and other soft tissues: Paraspinous soft tissues within normal limits. 3.9 cm intramural fibroid present at the right posterior uterine fundus/body. Visualized visceral structures otherwise unremarkable. Disc levels: L1-2:  Unremarkable. L2-3: Normal interspace. Minimal facet spurring. No canal or foraminal stenosis. L3-4: Normal interspace. Mild bilateral facet hypertrophy. No canal or foraminal stenosis. L4-5: Mild degenerative intervertebral disc space narrowing with diffuse disc bulge and disc desiccation. Superimposed small central disc protrusion with annular fissure indents the ventral thecal sac (series 8, image 33). Mild to moderate facet and ligament flavum hypertrophy. Resultant mild spinal stenosis. Foramina remain patent. No frank impingement. L5-S1: Shallow central disc protrusion indents the ventral thecal sac (series 8, image 37). No significant canal or lateral recess stenosis. Foramina remain patent. No impingement. IMPRESSION: 1. Question increased T2/STIR signal within the partially visualized distal cord at the  level of T11-12 as above, indeterminate. While this finding could potentially be artifactual in nature given that this is seen at the margins of the provided images, possible myelitis or other intrinsic spinal cord process is difficult to exclude, particularly given the patient's history and symptoms. Further evaluation with dedicated MRI of the thoracic spine, with and without contrast, recommended for further evaluation. 2. Small central disc protrusion with annular fissure and facet hypertrophy at L4-5 with resultant mild spinal stenosis. 3. Shallow central disc protrusion at L5-S1 without stenosis or impingement. 4. 3.9 cm intramural fibroid at the right posterior uterine fundus/body. Electronically Signed   By: Jeannine Boga M.D.   On: 09/25/2021 02:28   MR LUMBAR SPINE W CONTRAST  Result Date: 09/25/2021 CLINICAL DATA:  35 year old female with acute onset glove like extremity numbness 09/20/2021. Difficulty walking. Abnormal gait. No incontinence. No back or extremity pain. EXAM: MRI LUMBAR SPINE WITH CONTRAST TECHNIQUE: Multiplanar and multiecho pulse sequences of the lumbar spine were obtained with intravenous contrast. CONTRAST:  92mL GADAVIST GADOBUTROL 1 MMOL/ML IV SOLN in conjunction with contrast enhanced imaging of the cervical and thoracic spine reported separately. COMPARISON:  CT lumbar spine yesterday. Lumbar MRI without contrast 0121 hours today. FINDINGS: Segmentation: Normal on the comparison CT, with hypoplastic T12 ribs. Alignment:  Normal lumbar lordosis. Vertebrae: No evidence of marrow edema or enhancement. No acute osseous abnormality identified. Conus medullaris and cauda equina: Conus better demonstrated on the noncontrast MRI earlier today. But no abnormal lower thoracic spinal cord or conus enhancement. No lumbar dural thickening or abnormal intradural enhancement following contrast on this exam. Paraspinal and other soft tissues: Distended urinary bladder redemonstrated.  Round 3.8 cm dorsal fibroid at the uterine fundus. Otherwise  negative. Disc levels: L4-L5 disc degeneration better demonstrated on the noncontrast exam today. IMPRESSION: 1. No abnormal enhancement of the conus medullaris or cauda equina in association with the multifocal spinal cord lesions seen by cervical, thoracic, and the earlier noncontrast lumbar MRI today. 2. Fibroid uterus. Electronically Signed   By: Genevie Ann M.D.   On: 09/25/2021 06:24   MR Cervical Spine W or Wo Contrast  Result Date: 09/25/2021 CLINICAL DATA:  35 year old female with acute onset glove like extremity numbness 09/20/2021. Difficulty walking. Abnormal gait. No incontinence. No back or extremity pain. EXAM: MRI CERVICAL SPINE WITHOUT AND WITH CONTRAST TECHNIQUE: Multiplanar and multiecho pulse sequences of the cervical spine, to include the craniocervical junction and cervicothoracic junction, were obtained without and with intravenous contrast. CONTRAST:  11mL GADAVIST GADOBUTROL 1 MMOL/ML IV SOLN COMPARISON:  CT head and lumbar spine 09/24/2021. FINDINGS: Alignment: Preserved cervical lordosis.  No spondylolisthesis. Vertebrae: No marrow edema or evidence of acute osseous abnormality. Visualized bone marrow signal is within normal limits. Cord: Abnormal. There is a round, indistinct roughly 6-7 mm diameter area of abnormal T2 and STIR hyperintensity within the ventral cervical cord at the C3-C4 level (series 18, image 9 and series 19, image 21. The cord appears slightly expanded, but there is no associated enhancement. Caudal to that there is a questionable smaller more subtle and indistinct T2 hyperintense cord lesion at C7-T1. See series 16, image 19. No cord expansion or enhancement. No myelomalacia. No dural thickening or abnormal intradural enhancement. Posterior Fossa, vertebral arteries, paraspinal tissues: Cervicomedullary junction is within normal limits. Negative visible posterior fossa. Preserved major vascular flow voids in  the neck and at the skull base. The right vertebral artery appears dominant. Negative visible neck soft tissues. Negative visible upper chest. Disc levels: Negative. No age advanced or significant cervical spine degeneration. No spinal stenosis. No foraminal stenosis. No abnormal thickening or enhancement of the exiting cervical nerves. IMPRESSION: 1. Nonenhancing T2/STIR hyperintense lesion within the ventral cervical cord at C3-C4, and possible additional smaller, indistinct nonenhancing lesion at C7-T1. No cord edema. Subtle cord expansion at the former. No myelomalacia. See also Thoracic Spine MRI today (reported separately). The constellation is most suspicious for Spinal Cord Demyelinating Disease, but other multifocal cord inflammation is possible (such as Neurosarcoidosis). Follow-up Brain MRI without and with contrast recommended. 2. No significant cervical spine degeneration. No spinal or neural foraminal stenosis. Electronically Signed   By: Genevie Ann M.D.   On: 09/25/2021 06:00   MR THORACIC SPINE W WO CONTRAST  Result Date: 09/25/2021 CLINICAL DATA:  35 year old female with acute onset glove like extremity numbness 09/20/2021. Difficulty walking. Abnormal gait. No incontinence. No back or extremity pain. EXAM: MRI THORACIC WITHOUT AND WITH CONTRAST TECHNIQUE: Multiplanar and multiecho pulse sequences of the thoracic spine were obtained without and with intravenous contrast. CONTRAST:  23mL GADAVIST GADOBUTROL 1 MMOL/ML IV SOLN in conjunction with contrast enhanced imaging of the cervical spine reported separately. COMPARISON:  Cervical spine MRI today. FINDINGS: Limited cervical spine imaging:  Detailed separately. Thoracic spine segmentation: Hypoplastic or absent ribs suspected at T12. Alignment: Thoracic kyphosis is within normal limits. No spondylolisthesis. No scoliosis. Vertebrae: Visualized bone marrow signal is within normal limits. No marrow edema or evidence of acute osseous abnormality.  Cord: There is a degree of thoracic epidural lipomatosis diffusely, mildly effacing CSF from the thecal sac at many levels, maximal at T6. Sagittal STIR imaging demonstrates at least 4 STIR hyperintense spinal cord lesions with indistinct margins ranging from  6-12 mm in length from the T5 through the T10 cord level (series 6, image 10). Most of these are plainly visible on sagittal T2 imaging also (series 4, image 10). But axial T2 images also demonstrate a smaller T11 level cord lesion with hyperintensity and indistinct margins on series 7, image 34. Following contrast there is faint curvilinear enhancement of only the T5 level lesion (series 10, image 9). No cord expansion or edema. The conus medullaris appears spared at T12-L1. No dural thickening or leptomeningeal enhancement. Paraspinal and other soft tissues: Negative. Negative visible thoracic and upper abdominal viscera. Disc levels: There is a tiny right paracentral thoracic disc protrusion at T7-T8 (series 7, image 23 and series 4, image 10). IMPRESSION: 1. Positive for at least five T2/STIR hyperintense thoracic spinal cord lesions with indistinct margins, ranging from 6 mm -12 mm in length, from the T5 through T11 cord levels. Faint enhancement of only the largest lesion at the T5 level. No cord edema or expansion. 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 2. Superimposed epidural lipomatosis, and tiny degenerative disc herniation at T7-T8. Electronically Signed   By: Genevie Ann M.D.   On: 09/25/2021 06:10   (Echo, Carotid, EGD, Colonoscopy, ERCP)    Subjective: No complaints  Discharge Exam: Vitals:   09/29/21 0408 09/29/21 0817  BP: 137/89 136/67  Pulse: 63 94  Resp: 16 18  Temp: 98 F (36.7 C) 97.6 F (36.4 C)  SpO2: 100% 100%   Vitals:   09/28/21 2013 09/29/21 0035 09/29/21 0408 09/29/21 0817  BP: (!) 154/85 140/77 137/89 136/67   Pulse: 88 69 63 94  Resp: 18 18 16 18   Temp: 98.3 F (36.8 C) 98.7 F (37.1 C) 98 F (36.7 C) 97.6 F (36.4 C)  TempSrc: Oral Axillary Oral Oral  SpO2: 97% 99% 100% 100%  Weight:      Height:        General: Pt is alert, awake, not in acute distress Cardiovascular: RRR, S1/S2 +, no rubs, no gallops Respiratory: CTA bilaterally, no wheezing, no rhonchi Abdominal: Soft, NT, ND, bowel sounds + Extremities: no edema, no cyanosis    The results of significant diagnostics from this hospitalization (including imaging, microbiology, ancillary and laboratory) are listed below for reference.     Microbiology: Recent Results (from the past 240 hour(s))  Resp Panel by RT-PCR (Flu A&B, Covid) Nasopharyngeal Swab     Status: None   Collection Time: 09/25/21  6:18 AM   Specimen: Nasopharyngeal Swab; Nasopharyngeal(NP) swabs in vial transport medium  Result Value Ref Range Status   SARS Coronavirus 2 by RT PCR NEGATIVE NEGATIVE Final    Comment: (NOTE) SARS-CoV-2 target nucleic acids are NOT DETECTED.  The SARS-CoV-2 RNA is generally detectable in upper respiratory specimens during the acute phase of infection. The lowest concentration of SARS-CoV-2 viral copies this assay can detect is 138 copies/mL. A negative result does not preclude SARS-Cov-2 infection and should not be used as the sole basis for treatment or other patient management decisions. A negative result may occur with  improper specimen collection/handling, submission of specimen other than nasopharyngeal swab, presence of viral mutation(s) within the areas targeted by this assay, and inadequate number of viral copies(<138 copies/mL). A negative result must be combined with clinical observations, patient history, and epidemiological information. The expected result is Negative.  Fact Sheet for Patients:  EntrepreneurPulse.com.au  Fact Sheet for Healthcare Providers:  IncredibleEmployment.be  This test is no t yet approved or cleared by the [place] and  has been authorized for detection and/or diagnosis of SARS-CoV-2 by FDA under an Emergency Use Authorization (EUA). This EUA will remain  in effect (meaning this test can be used) for the duration of the COVID-19 declaration under Section 564(b)(1) of the Act, 21 U.S.C.section 360bbb-3(b)(1), unless the authorization is terminated  or revoked sooner.       Influenza A by PCR NEGATIVE NEGATIVE Final   Influenza B by PCR NEGATIVE NEGATIVE Final    Comment: (NOTE) The Xpert Xpress SARS-CoV-2/FLU/RSV plus assay is intended as an aid in the diagnosis of influenza from Nasopharyngeal swab specimens and should not be used as a sole basis for treatment. Nasal washings and aspirates are unacceptable for Xpert Xpress SARS-CoV-2/FLU/RSV testing.  Fact Sheet for Patients: EntrepreneurPulse.com.au  Fact Sheet for Healthcare Providers: IncredibleEmployment.be  This test is not yet approved or cleared by the [place] FDA and has been authorized for detection and/or diagnosis of SARS-CoV-2 by FDA under an Emergency Use Authorization (EUA). This EUA will remain in effect (meaning this test can be used) for the duration of the COVID-19 declaration under Section 564(b)(1) of the Act, 21 U.S.C. section 360bbb-3(b)(1), unless the authorization is terminated or revoked.  Performed at [facility], [place] [address] [place], [place] [address]      Labs: BNP (last 3 results) No results for input(s): BNP in the last 8760 hours. Basic Metabolic Panel: Recent Labs  Lab 09/24/21 1809 09/26/21 0402 09/28/21 0228 09/29/21 0311  NA 138 135 137 134*  K 3.9 3.9 3.9 3.9  CL 103 103 103 105  CO2 22 23 24 22   GLUCOSE 102* 126* 115* 101*  BUN 10 9 18 16   CREATININE 0.66 0.63 0.69 0.65  CALCIUM 9.9 9.1 9.0 8.9   Liver Function  Tests: Recent Labs  Lab 09/24/21 1809  AST 14*  ALT 14  ALKPHOS 84  BILITOT 0.6  PROT 8.1  ALBUMIN 4.6   No results for input(s): LIPASE, AMYLASE in the last 168 hours. No results for input(s): AMMONIA in the last 168 hours. CBC: Recent Labs  Lab 09/24/21 1809 09/26/21 0244 09/28/21 0228 09/29/21 0311  WBC 12.6* 11.1* 10.0 11.4*  NEUTROABS 10.9*  --  8.0* 8.3*  HGB 14.1 12.1 12.6 13.0  HCT 42.1 36.2 38.5 39.3  MCV 85.9 85.2 87.3 86.4  PLT 406* 306 344 338   Cardiac Enzymes: No results for input(s): CKTOTAL, CKMB, CKMBINDEX, TROPONINI in the last 168 hours. BNP: Invalid input(s): POCBNP CBG: Recent Labs  Lab 09/28/21 0611 09/28/21 1218 09/28/21 1616 09/28/21 2100 09/29/21 0624  GLUCAP 90 118* 114* 110* 84   D-Dimer No results for input(s): DDIMER in the last 72 hours. Hgb A1c Recent Labs    09/27/21 0256  HGBA1C 5.0   Lipid Profile No results for input(s): CHOL, HDL, LDLCALC, TRIG, CHOLHDL, LDLDIRECT in the last 72 hours. Thyroid function studies No results for input(s): TSH, T4TOTAL, T3FREE, THYROIDAB in the last 72 hours.  Invalid input(s): FREET3 Anemia work up No results for input(s): VITAMINB12, FOLATE, FERRITIN, TIBC, IRON, RETICCTPCT in the last 72 hours. Urinalysis    Component Value Date/Time   COLORURINE YELLOW 12/27/2018 0212   APPEARANCEUR CLEAR 12/27/2018 0212   LABSPEC 1.029 12/27/2018 0212   PHURINE 5.0 12/27/2018 0212   GLUCOSEU NEGATIVE 12/27/2018 0212   HGBUR MODERATE (A) 12/27/2018 0212   BILIRUBINUR NEGATIVE 12/27/2018 0102  KETONESUR 80 (A) 12/27/2018 0212   PROTEINUR NEGATIVE 12/27/2018 0212   UROBILINOGEN 0.2 06/19/2009 0900   NITRITE NEGATIVE 12/27/2018 0212   LEUKOCYTESUR SMALL (A) 12/27/2018 0212   Sepsis Labs Invalid input(s): PROCALCITONIN,  WBC,  LACTICIDVEN Microbiology Recent Results (from the past 240 hour(s))  Resp Panel by RT-PCR (Flu A&B, Covid) Nasopharyngeal Swab     Status: None   Collection Time:  09/25/21  6:18 AM   Specimen: Nasopharyngeal Swab; Nasopharyngeal(NP) swabs in vial transport medium  Result Value Ref Range Status   SARS Coronavirus 2 by RT PCR NEGATIVE NEGATIVE Final    Comment: (NOTE) SARS-CoV-2 target nucleic acids are NOT DETECTED.  The SARS-CoV-2 RNA is generally detectable in upper respiratory specimens during the acute phase of infection. The lowest concentration of SARS-CoV-2 viral copies this assay can detect is 138 copies/mL. A negative result does not preclude SARS-Cov-2 infection and should not be used as the sole basis for treatment or other patient management decisions. A negative result may occur with  improper specimen collection/handling, submission of specimen other than nasopharyngeal swab, presence of viral mutation(s) within the areas targeted by this assay, and inadequate number of viral copies(<138 copies/mL). A negative result must be combined with clinical observations, patient history, and epidemiological information. The expected result is Negative.  Fact Sheet for Patients:  EntrepreneurPulse.com.au  Fact Sheet for Healthcare Providers:  IncredibleEmployment.be  This test is no t yet approved or cleared by the [place] FDA and  has been authorized for detection and/or diagnosis of SARS-CoV-2 by FDA under an Emergency Use Authorization (EUA). This EUA will remain  in effect (meaning this test can be used) for the duration of the COVID-19 declaration under Section 564(b)(1) of the Act, 21 U.S.C.section 360bbb-3(b)(1), unless the authorization is terminated  or revoked sooner.       Influenza A by PCR NEGATIVE NEGATIVE Final   Influenza B by PCR NEGATIVE NEGATIVE Final    Comment: (NOTE) The Xpert Xpress SARS-CoV-2/FLU/RSV plus assay is intended as an aid in the diagnosis of influenza from Nasopharyngeal swab specimens and should not be used as a sole basis for treatment. Nasal washings  and aspirates are unacceptable for Xpert Xpress SARS-CoV-2/FLU/RSV testing.  Fact Sheet for Patients: EntrepreneurPulse.com.au  Fact Sheet for Healthcare Providers: IncredibleEmployment.be  This test is not yet approved or cleared by the [place] FDA and has been authorized for detection and/or diagnosis of SARS-CoV-2 by FDA under an Emergency Use Authorization (EUA). This EUA will remain in effect (meaning this test can be used) for the duration of the COVID-19 declaration under Section 564(b)(1) of the Act, 21 U.S.C. section 360bbb-3(b)(1), unless the authorization is terminated or revoked.  Performed at [facility], [place] [address] [place], [place] [address]       SIGNED:   Charlynne Cousins, MD  [facility] 09/29/2021, 9:57 AM Pager   If 7PM-7AM, please contact night-coverage [email or web address] Password [email or web address]

## 2021-09-29 NOTE — Progress Notes (Signed)
Physical Therapy Treatment Patient Details Name: Jean Woods MRN: [id number] DOB: September 02, 1986 Today's Date: 09/29/2021   History of Present Illness 35 y.o. female presents to [facility] hospital on 09/24/2021 with numbness and tingling of BLE. Pt also reports a recent history of dizziness. MRI brain and spine from 09/25/2021 demonstrate cerebral white matter disease most consistent with multiple sclerosis. No significant PMH.    PT Comments    Pt received in long sit in bed, motivated to participate in PT session and with good tolerance for gait and stair training. Pt needing up to min guard for stair navigation with single handrail (x7 steps) and scored 17/24 on Dynamic Gait Index. Scores of 19 or less are predictive of falls in older community living adults. Continue to recommend OPPT to work on balance, energy conservation and strength/endurance building.  Recommendations for follow up therapy are one component of a multi-disciplinary discharge planning process, led by the attending physician.  Recommendations may be updated based on patient status, additional functional criteria and insurance authorization.  Follow Up Recommendations  Outpatient PT     Equipment Recommendations  Other (comment) (tub transfer bench with back support)    Recommendations for Other Services       Precautions / Restrictions Precautions Precautions: Fall Precaution Comments: dizziness (BP stable) Restrictions Weight Bearing Restrictions: No     Mobility  Bed Mobility Overal bed mobility: Independent                  Transfers Overall transfer level: Modified independent Equipment used: None Transfers: Sit to/from Stand Sit to Stand: Modified independent (Device/Increase time)         General transfer comment: modI, safe hand placement  Ambulation/Gait Ambulation/Gait assistance: Supervision Gait Distance (Feet): 125 Feet Assistive device: None Gait Pattern/deviations: Step-through  pattern;Decreased stride length Gait velocity: decreased Gait velocity interpretation: 1.31 - 2.62 ft/sec, indicative of limited community ambulator General Gait Details: pt with hesitant step-through gait pattern, slow and careful pace, no overt LOB or buckling   Stairs Stairs: Yes Stairs assistance: Supervision;Min guard Stair Management: One rail Right;Step to pattern Number of Stairs: 7 General stair comments: cues for activity pacing; pt attempted 10 but fatigues after 7   Wheelchair Mobility    Modified Rankin (Stroke Patients Only)       Balance Overall balance assessment: Needs assistance Sitting-balance support: Feet supported Sitting balance-Leahy Scale: Normal     Standing balance support: No upper extremity supported;During functional activity Standing balance-Leahy Scale: Fair Standing balance comment: no LOB with DGI tasks but c/o fatigue, no dizziness with head turns or pivot                 Standardized Balance Assessment Standardized Balance Assessment : Dynamic Gait Index   Dynamic Gait Index Level Surface: Mild Impairment Change in Gait Speed: Normal Gait with Horizontal Head Turns: Normal Gait with Vertical Head Turns: Normal Gait and Pivot Turn: Mild Impairment Step Over Obstacle: Moderate Impairment Step Around Obstacles: Mild Impairment Steps: Moderate Impairment Total Score: 17      Cognition Arousal/Alertness: Awake/alert Behavior During Therapy: WFL for tasks assessed/performed;Anxious Overall Cognitive Status: Within Functional Limits for tasks assessed                                 General Comments: Again discussed energy conservation/activity pacing and handout given to reinforce, pt and spouse receptive to instruction.      Exercises  General Comments General comments (skin integrity, edema, etc.): VSS on RA, no acute s/sx distress      Pertinent Vitals/Pain Pain Assessment: Faces Faces Pain  Scale: Hurts little more Pain Location: BLE increased burning/discomfort with stairs Pain Descriptors / Indicators: Sore;Tingling;Discomfort Pain Intervention(s): Limited activity within patient's tolerance;Monitored during session    Home Living                      Prior Function            PT Goals (current goals can now be found in the care plan section) Acute Rehab PT Goals Patient Stated Goal: go home and get back to independence PT Goal Formulation: With patient Time For Goal Achievement: 10/10/21 Progress towards PT goals: Progressing toward goals    Frequency    Min 3X/week      PT Plan Discharge plan needs to be updated;Equipment recommendations need to be updated    Co-evaluation              AM-PAC PT "6 Clicks" Mobility   Outcome Measure  Help needed turning from your back to your side while in a flat bed without using bedrails?: None Help needed moving from lying on your back to sitting on the side of a flat bed without using bedrails?: None Help needed moving to and from a bed to a chair (including a wheelchair)?: None Help needed standing up from a chair using your arms (e.g., wheelchair or bedside chair)?: None Help needed to walk in hospital room?: A Little Help needed climbing 3-5 steps with a railing? : A Little 6 Click Score: 22    End of Session Equipment Utilized During Treatment: Gait belt Activity Tolerance: Patient tolerated treatment well Patient left: in bed;with call bell/phone within reach;with family/visitor present Nurse Communication: Mobility status PT Visit Diagnosis: Other symptoms and signs involving the nervous system (R29.898);Other abnormalities of gait and mobility (R26.89)     Time: 1101-1120 PT Time Calculation (min) (ACUTE ONLY): 19 min  Charges:  $Gait Training: 8-22 mins                     Paislea Hatton P., PTA Acute Rehabilitation Services Pager: [phone] Office: [place] 09/29/2021, 11:56 AM

## 2021-10-01 ENCOUNTER — Telehealth: Payer: Self-pay

## 2021-10-01 LAB — NEUROMYELITIS OPTICA AUTOAB, IGG: NMO-IgG: <1.5 U/mL (ref 0.0–3.0)

## 2021-10-01 NOTE — Telephone Encounter (Signed)
Transition Care Management Follow-up Telephone Call Date of discharge and from where: 09/29/21 from [facility] How have you been since you were released from the hospital? "I've been ok, moving around little bit better" Any questions or concerns? No  Items Reviewed: Did the pt receive and understand the discharge instructions provided? Yes  Medications obtained and verified?  No new medications ordered Other? No  Any new allergies since your discharge? No  Dietary orders reviewed? Yes Do you have support at home? Yes   Home Care and Equipment/Supplies: Were home health services ordered? no If so, what is the name of the agency? Not applicable  Has the agency set up a time to come to the patient's home? not applicable Were any new equipment or medical supplies ordered?  No What is the name of the medical supply agency? Not applicable Were you able to get the supplies/equipment? not applicable Do you have any questions related to the use of the equipment or supplies? No  Functional Questionnaire: (I = Independent and D = Dependent) ADLs:  I  Bathing/Dressing- I  Meal Prep- I  Eating- I  Maintaining continence- I  Transferring/Ambulation- I  Managing Meds- I  Follow up appointments reviewed:  PCP Hospital f/u appt confirmed? No  Needs follow up appointment. Request scheduler to schedule. [facility] f/u appt confirmed? No  Patient states she will call to schedule 1-2 week follow up. Outpatient rehabilitation appointments scheduled to begin 10/04/21 Are transportation arrangements needed? No  If their condition worsens, is the pt aware to call PCP or go to the Emergency Dept.? Yes Was the patient provided with contact information for the PCP's office or ED? Yes Was to pt encouraged to call back with questions or concerns? Yes    Thea Silversmith, RN, MSN, BSN, [place] Care Management Coordinator [phone]

## 2021-10-03 ENCOUNTER — Encounter: Payer: Self-pay | Admitting: Neurology

## 2021-10-03 ENCOUNTER — Ambulatory Visit: Payer: BC Managed Care – PPO | Admitting: Neurology

## 2021-10-03 ENCOUNTER — Telehealth: Payer: Self-pay | Admitting: Neurology

## 2021-10-03 VITALS — BP 140/79 | HR 92 | Ht 64.0 in | Wt 245.0 lb

## 2021-10-03 DIAGNOSIS — Z79899 Other long term (current) drug therapy: Secondary | ICD-10-CM | POA: Diagnosis not present

## 2021-10-03 DIAGNOSIS — E559 Vitamin D deficiency, unspecified: Secondary | ICD-10-CM

## 2021-10-03 DIAGNOSIS — G35 Multiple sclerosis: Secondary | ICD-10-CM | POA: Diagnosis not present

## 2021-10-03 NOTE — Telephone Encounter (Signed)
Placed J[facility] lab in [facility] lock box for routine lab pick up. Results pending. 

## 2021-10-03 NOTE — Progress Notes (Signed)
[facility]  PATIENT: Jean Woods DOB: 11-27-1986  REFERRING DOCTOR OR PCP:  Varney Baas, MD; Pricilla Holm (PCP) SOURCE: Patient, notes from recent hospitalization, imaging and laboratory reports, multiple MRI images personally reviewed  _________________________________   HISTORICAL  CHIEF COMPLAINT:  Chief Complaint  Patient presents with   New Patient (Initial Visit)    Pt with husband, rm 1.    HISTORY OF PRESENT ILLNESS:  I had the pleasure of seeing your patient, Jean Woods, at the [place] [facility] for neurologic consultation regarding her recent diagnosis of multiple sclerosis.  She is a 35 year old woman who presented to the emergency room 09/24/2021 with paresthesias and was admitted after imaging studies showed findings consistent with multiple sclerosis.   She had numbness from her waist on down to the legs.    She felt gait was slightly affected. Strength was fine.   She was admittted and had 5 days of IV Solu-Medrol.   Numbness is slowly improving though her feet and perineum are still numb.      As the numbness resolved, she felt more sore and also felt mildly weak.      In retrospect, she had some epsidoes of numbness in her legs that lasted a few days to 2 weeks and these episodes were not as severe as the one early October.  The first episode was in 2018 and she had 3-4 other episodes over the past 3 years.     She had blurry vision with a gray spot in the middle of her OS vision, much better the next day but some visual change for 2-3 days.     Currently, she notes numbness in her feet and groin that is improving.   Strength is good.   Balance is slightly off due to the numbness and she is walking slower and uses the bannister on stairs.   Bladder and bowel function are fine.    She has not noted vision issues.    She notes more fatigue the past few years.   Cognition is doing well.   Mood is doing well.    She sleeps well most nights.  We had a long discussion about MS, treatment options, prognosis, etc.  While in the hospital 09/25/21-09/28/2021:  EKG rhythm strip showed normal sinus rhythm with a PR interval of 13 ms QTc = 0.34 ms.  CBC with differential, chemistries and liver function tests were normal.anti-AQP4 antibody was negative.  Vitamin D 06/28/2021 was low at 13.  She started taking 1000 units daily.   Imaging review: MRI of the brain 09/25/2021 showed multiple T2/FLAIR hyperintense foci in the periventricular, juxtacortical and deep white matter.  None of the foci enhanced after contrast.  The focus adjacent to C2-C3 is also seen.  MRI of the thoracic spine 09/25/2021 showed T2 hyperintense foci within the spinal cord adjacent to T5 (central), T7 (central), T8 (central slightly posterior into the left), T10-T11 (2 foci 1 central and 1 to the left) and T11-T12 (anterior slightly to the right).  The focus at T5 enhanced after contrast.  MRI of the cervical spine 09/25/2021 showed a focus adjacent to C3-C4 (anterocentral) and a possible focus adjacent to C7-T1 (posterior)  MRI of the lumbar spine 09/25/2021 showed a focus at T11-T12 and a subtle focus to the left at T12.  Additionally at L4-L5 there is spinal stenosis due to disc protrusion and increased epidural fat.  There is no significant foraminal narrowing.  There  is mild lateral recess stenosis but no nerve root compression.      REVIEW OF SYSTEMS: Constitutional: No fevers, chills, sweats, or change in appetite Eyes: No visual changes, double vision, eye pain Ear, nose and throat: No hearing loss, ear pain, nasal congestion, sore throat Cardiovascular: No chest pain, palpitations Respiratory:  No shortness of breath at rest or with exertion.   No wheezes GastrointestinaI: No nausea, vomiting, diarrhea, abdominal pain, fecal incontinence Genitourinary:  No dysuria, urinary retention or frequency.  No nocturia. Musculoskeletal:  No  neck pain, back pain Integumentary: No rash, pruritus, skin lesions Neurological: as above Psychiatric: No depression at this time.  No anxiety Endocrine: No palpitations, diaphoresis, change in appetite, change in weigh or increased thirst Hematologic/Lymphatic:  No anemia, purpura, petechiae. Allergic/Immunologic: No itchy/runny eyes, nasal congestion, recent allergic reactions, rashes  ALLERGIES: No Known Allergies  HOME MEDICATIONS:  Current Outpatient Medications:    acetaminophen (TYLENOL) 500 MG tablet, Take 1,000 mg by mouth every 6 (six) hours as needed for moderate pain or headache., Disp: , Rfl:    albuterol (PROVENTIL HFA;VENTOLIN HFA) 108 (90 Base) MCG/ACT inhaler, Inhale 1 puff into the lungs every 6 (six) hours as needed for wheezing or shortness of breath., Disp: , Rfl:    beclomethasone (QVAR) 80 MCG/ACT inhaler, Inhale 1 puff into the lungs daily., Disp: , Rfl:    cholecalciferol (VITAMIN D) 25 MCG (1000 UNIT) tablet, Take 1,000 Units by mouth at bedtime., Disp: , Rfl:    famotidine (PEPCID) 20 MG tablet, Take 1 tablet (20 mg total) by mouth 2 (two) times daily. (Patient taking differently: Take 20 mg by mouth every evening.), Disp: 30 tablet, Rfl: 0   levocetirizine (XYZAL) 5 MG tablet, Take 5 mg by mouth every evening., Disp: , Rfl:    OLOPATADINE HCL NA, Place 2 sprays into both nostrils See admin instructions. Once or twice daily, Disp: , Rfl:   PAST MEDICAL HISTORY: Past Medical History:  Diagnosis Date   Asthma    Chicken pox    Depression    Seasonal allergies     PAST SURGICAL HISTORY: No past surgical history on file.  FAMILY HISTORY: Family History  Problem Relation Age of Onset   Breast cancer Mother    Thyroid disease Mother    Stroke Maternal Grandmother    Thyroid disease Maternal Grandmother    Colon cancer Maternal Grandfather    Neurologic Disorder Neg Hx    Multiple sclerosis Neg Hx     SOCIAL HISTORY:  Social History    Socioeconomic History   Marital status: Married    Spouse name: Not on file   Number of children: Not on file   Years of education: Not on file   Highest education level: Not on file  Occupational History   Occupation: HR  Tobacco Use   Smoking status: Never   Smokeless tobacco: Never  Vaping Use   Vaping Use: Never used  Substance and Sexual Activity   Alcohol use: Yes    Alcohol/week: 5.0 standard drinks    Types: 5 Cans of beer per week    Comment: weekends   Drug use: Never   Sexual activity: Not on file  Other Topics Concern   Not on file  Social History Narrative   Not on file   Social Determinants of Health   Financial Resource Strain: Not on file  Food Insecurity: Not on file  Transportation Needs: Not on file  Physical Activity: Not on file  Stress: Not on file  Social Connections: Not on file  Intimate Partner Violence: Not on file     PHYSICAL EXAM  Vitals:   10/03/21 0908  Weight: 245 lb (111.1 kg)  Height: 5\' 4"  (1.626 m)    Body mass index is 42.05 kg/m.   General: The patient is well-developed and well-nourished and in no acute distress  HEENT:  Head is [place]/AT.  Sclera are anicteric.  Funduscopic exam shows normal optic discs and retinal vessels.  Neck: No carotid bruits are noted.  The neck is nontender.  Cardiovascular: The heart has a regular rate and rhythm with a normal S1 and S2. There were no murmurs, gallops or rubs.    Skin: Extremities are without rash or  edema.  Musculoskeletal:  Back is nontender  Neurologic Exam  Mental status: The patient is alert and oriented x 3 at the time of the examination. The patient has apparent normal recent and remote memory, with an apparently normal attention span and concentration ability.   Speech is normal.  Cranial nerves: Extraocular movements are full. Pupils are equal, round, and reactive to light and accomodation.  She has slight reduced color vision OS relative to OD.  Facial  symmetry is present. There is good facial sensation to soft touch bilaterally.Facial strength is normal.  Trapezius and sternocleidomastoid strength is normal. No dysarthria is noted.  The tongue is midline, and the patient has symmetric elevation of the soft palate. No obvious hearing deficits are noted.  Motor:  Muscle bulk is normal.   Tone is normal. Strength is  5 / 5 in all 4 extremities.   Sensory: Sensory testing is intact to pinprick, soft touch and vibration sensation in all 4 extremities.  Coordination: Cerebellar testing reveals good finger-nose-finger and heel-to-shin bilaterally.  Gait and station: Station is normal.   Gait is slightly wide when she turns.  Tandem gait is wide.  Romberg is negative.   Reflexes: Deep tendon reflexes are symmetric and normal bilaterally.   Plantar responses are flexor.    DIAGNOSTIC DATA (LABS, IMAGING, TESTING) - I reviewed patient records, labs, notes, testing and imaging myself where available.  Lab Results  Component Value Date   WBC 11.4 (H) 09/29/2021   HGB 13.0 09/29/2021   HCT 39.3 09/29/2021   MCV 86.4 09/29/2021   PLT 338 09/29/2021      Component Value Date/Time   NA 134 (L) 09/29/2021 0311   K 3.9 09/29/2021 0311   CL 105 09/29/2021 0311   CO2 22 09/29/2021 0311   GLUCOSE 101 (H) 09/29/2021 0311   BUN 16 09/29/2021 0311   CREATININE 0.65 09/29/2021 0311   CALCIUM 8.9 09/29/2021 0311   PROT 8.1 09/24/2021 1809   ALBUMIN 4.6 09/24/2021 1809   AST 14 (L) 09/24/2021 1809   ALT 14 09/24/2021 1809   ALKPHOS 84 09/24/2021 1809   BILITOT 0.6 09/24/2021 1809   GFRNONAA >60 09/29/2021 0311   GFRAA >60 12/27/2018 1615   Lab Results  Component Value Date   CHOL 257 (H) 06/28/2021   HDL 72.30 06/28/2021   LDLCALC 170 (H) 06/28/2021   LDLDIRECT 164.5 12/11/2012   TRIG 73.0 06/28/2021   CHOLHDL 4 06/28/2021   Lab Results  Component Value Date   HGBA1C 5.0 09/27/2021   Lab Results  Component Value Date   EZMOQHUT65  465 06/28/2021   Lab Results  Component Value Date   TSH 2.12 06/28/2021       ASSESSMENT AND PLAN  Multiple sclerosis ([place]) - Plan: Varicella zoster antibody, IgG, Stratify JCV Antibody Test ([facility]), CYP2C9 Genotyping Siponimod  High risk medication use - Plan: Varicella zoster antibody, IgG, Stratify JCV Antibody Test ([facility]), CYP2C9 Genotyping Siponimod  Vitamin D deficiency - Plan: VITAMIN D 25 Hydroxy (Vit-D Deficiency, Fractures)   In summary, Ms. Montijo is a 35 year old woman who presented earlier this month with numbness from her waist down and was found to have foci in the brain and spinal cord consistent with demyelinating plaque.  1 focus adjacent to T5 with a spinal cord enhanced after contrast.  She has had other episodes of the past 3 years of numbness or other neurologic symptoms including visual loss 18 months ago.  The combination of her symptoms, exam and MRI meet the McDonald criteria for clinically definite relapsing remitting MS.  We discussed different treatment options.  Because she has multiple spinal cord lesions she is presenting with a course that is likely more aggressive than typical.  Therefore, I would like to have her begin either Tysabri, Ocrevus or one of the S1 P receptor modulators.  We spent some time discussing these options.  She is most interested in Tysabri and would like to start that if she is JCV antibody negative.  Because she has a strong family history of breast cancer she is reluctant to start [place].  Therefore, she is JCV antibody positive, she would like to start Zeposia or one of the other S1 P receptor modulators.  I did discuss that if he starts Zeposia we are participating in an open label phase 3B study of the Zeposia correlating MRI changes to cognition.  We will check a JCV antibody as well as anti-VZV IgG, cytochrome P450 2C9.  Additionally she had low vitamin D earlier this year.  She is supplementing with 1,000 units a day.  I will  recheck.  If she is not above the lower limit of normal I will have her take 50,000 units weekly for a while.  She will return to see me in 3 months but sooner if there are new or worsening neurologic symptoms or based on the results of studies or issues with medication.  Thank you for asking me to see Jean Woods at the [facility] at [facility].  Please let me know if I can be of further assistance with her or other patients in the future.   Jean Woods A. Felecia Shelling, MD, [facility] 16/09/9603, 5:40 AM Certified in Neurology, Clinical Neurophysiology, Sleep Medicine and Neuroimaging  [facility] [address] [place], [place] [address] [phone]

## 2021-10-04 ENCOUNTER — Ambulatory Visit: Payer: BC Managed Care – PPO

## 2021-10-11 NOTE — Telephone Encounter (Signed)
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

## 2021-10-12 ENCOUNTER — Telehealth: Payer: Self-pay | Admitting: Neurology

## 2021-10-12 LAB — CYP2C9 GENOTYPING SIPONIMOD

## 2021-10-12 LAB — VARICELLA ZOSTER ANTIBODY, IGG: Varicella zoster IgG: 1400 index (ref >165)

## 2021-10-12 LAB — VITAMIN D 25 HYDROXY (VIT D DEFICIENCY, FRACTURES): Vit D, 25-Hydroxy: 26.5 ng/mL — ABNORMAL LOW (ref 30.0–100.0)

## 2021-10-12 NOTE — Telephone Encounter (Signed)
I called to go over the lab results and got voicemail.    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  Therefore, I would not recommend Tysabri.  We had also discussed Ocrevus and the S1 P receptor modulators (Gilenya, Mayzent, Zeposia).  Additionally, if she chose Zeposia she would have the opportunity to participate in the open label phase 3B study that we are doing.  She has been reluctant to go on Ocrevus due to family history of breast cancer.  Therefore, my recommendation would be either Zeposia or Mayzent.  I  will try to call again later today.  I will be out of the office next week.

## 2021-10-17 ENCOUNTER — Ambulatory Visit: Payer: BC Managed Care – PPO | Attending: Internal Medicine | Admitting: Rehabilitation

## 2021-10-17 ENCOUNTER — Encounter: Payer: Self-pay | Admitting: Rehabilitation

## 2021-10-17 ENCOUNTER — Other Ambulatory Visit: Payer: Self-pay

## 2021-10-17 DIAGNOSIS — R2681 Unsteadiness on feet: Secondary | ICD-10-CM | POA: Insufficient documentation

## 2021-10-17 DIAGNOSIS — R201 Hypoesthesia of skin: Secondary | ICD-10-CM | POA: Insufficient documentation

## 2021-10-17 DIAGNOSIS — M6281 Muscle weakness (generalized): Secondary | ICD-10-CM | POA: Diagnosis not present

## 2021-10-17 DIAGNOSIS — G379 Demyelinating disease of central nervous system, unspecified: Secondary | ICD-10-CM | POA: Diagnosis not present

## 2021-10-17 DIAGNOSIS — R202 Paresthesia of skin: Secondary | ICD-10-CM | POA: Diagnosis not present

## 2021-10-17 NOTE — Telephone Encounter (Signed)
Patient returned call.  I reviewed the information that was mentioned by Dr. Felecia Shelling below.  Advised his recommendation would be to go with the medication Mayzent or Zeposia.  Patient would like to check with her insurance and reviewed the differences between the 2.  She will then contact our office to let us know which one she prefers.  Advised the patient a start form will need to be completed in order to pursue getting the process started.  She verbalized understanding and was appreciative for the information.

## 2021-10-17 NOTE — Therapy (Signed)
[facility] [address] [place], [place], [address] Phone: [phone]   Fax:  [phone]  Physical Therapy Evaluation  Patient Details  Name: Jean Woods MRN: [id number] Date of Birth: 1986-02-28 Referring Provider (PT): Charlynne Cousins, MD   Encounter Date: 10/17/2021   PT End of Session - 10/17/21 1252     Visit Number 1    Number of Visits 7    Date for PT Re-Evaluation 12/01/21    Authorization Type BCBS    PT Start Time 0845    PT Stop Time 0928    PT Time Calculation (min) 43 min    Activity Tolerance Patient tolerated treatment well    Behavior During Therapy [facility] for tasks assessed/performed             Past Medical History:  Diagnosis Date   Asthma    Chicken pox    Depression    Seasonal allergies     History reviewed. No pertinent surgical history.  There were no vitals filed for this visit.    Subjective Assessment - 10/17/21 0849     Subjective Per pt "I was recently diagnosed with MS and was having trouble with my mobility.  I was in the hospital about 3 weeks ago and they provided me steriods which has helped.  I still have some numbness in my feet up to my knees. My quads feel sore, more so in the R.  I have a hard time feeling the ground."    Pertinent History recent diagnosis with MS (09/24/21)    Limitations Walking;House hold activities;Standing    How long can you stand comfortably? an hour or so    How long can you walk comfortably? about a mile (used to go hiking)    Patient Stated Goals "I want to have the knowledge on how to make myself mobile again if I have another flare up."    Currently in Pain? Yes    Pain Score 3     Pain Location Leg    Pain Orientation Right;Left    Pain Descriptors / Indicators Sore    Pain Type Acute pain    Pain Onset 1 to 4 weeks ago    Pain Frequency Intermittent    Aggravating Factors  moving    Pain Relieving Factors rest                 [facility] PT Assessment - 10/17/21 0854       Assessment   Medical Diagnosis MS    Referring Provider (PT) Charlynne Cousins, MD    Onset Date/Surgical Date 09/24/21      Precautions   Precautions Fall      Balance Screen   Has the patient fallen in the past 6 months No    Has the patient had a decrease in activity level because of a fear of falling?  Yes    Is the patient reluctant to leave their home because of a fear of falling?  Yes      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[employer])    Leisure [place], walk around neighborhood      Cognition   Overall Cognitive Status Within Functional Limits for tasks assessed      Sensation   Light Touch Impaired Detail    Light Touch Impaired Details Impaired RLE;Impaired LLE   intact but feels tingly up to knees in BLE   Hot/Cold Appears Intact      Coordination   Gross Motor Movements are Fluid and Coordinated Yes    Fine Motor Movements are Fluid and Coordinated Yes    Heel Shin Test slower and mildly less coordinated in RLE vs LLE      ROM / Strength   AROM / PROM / Strength Strength      Strength   Overall Strength Deficits    Overall Strength Comments R hip flex 4/5 and R knee flex 4/5, all others 5/5      Transfers   Transfers Sit to Stand;Stand to Sit    Sit to Stand 6: Modified  independent (Device/Increase time)    Five time sit to stand comments  13.19 secs without UE support    Stand to Sit 6: Modified independent (Device/Increase time)      Ambulation/Gait   Ambulation/Gait Yes    Ambulation/Gait Assistance 6: Modified independent (Device/Increase time);5: Supervision    Ambulation/Gait Assistance Details Pt able to ambulate outdoors overy varying surfaces x 500' at mod I level.  Told pt it was okay for her to begin to ambulate on simple trails again, taking phone for safety    Ambulation Distance (Feet) 500 Feet   then 500' outdoors   Assistive device None    Gait Pattern Step-through pattern;Decreased arm swing - right;Decreased arm swing - left    Ambulation Surface Level;Unlevel;Indoor;Outdoor;Paved;Grass    Gait velocity 3.33 ft/sec without device    Stairs Yes    Stairs Assistance 5: Supervision    Stairs Assistance Details (indicate cue type and reason) for safety    Stair Management Technique One rail Right;Alternating pattern;Step to pattern;Forwards    Number of Stairs 4    Height of Stairs 6      Functional Gait  Assessment   Gait assessed  Yes    Gait Level Surface Walks 20 ft in less than 7 sec but greater than 5.5 sec, uses assistive device, slower speed, mild gait deviations, or deviates 6-10 in outside of the 12 in walkway width.   6.09 secs   Change in Gait Speed Able to smoothly change walking speed without loss of balance or gait deviation. Deviate no more than 6 in outside of the 12 in walkway width.    Gait with Horizontal Head Turns Performs head turns smoothly with no change in gait. Deviates no more than 6 in outside 12 in walkway width    Gait with Vertical Head Turns Performs head turns with no change in gait. Deviates no more than 6 in outside 12 in walkway width.    Gait and Pivot Turn Pivot turns safely in greater than 3 sec and stops with no loss of balance, or pivot turns safely within 3 sec and stops with mild imbalance, requires  small steps to catch balance.    Step Over Obstacle Is able to step over one shoe box (4.5 in total height) but must slow down and adjust steps to clear box safely. May require verbal cueing.    Gait with Narrow Base of Support Is able to ambulate for 10 steps heel to toe with no staggering.    Gait with Eyes Closed Walks 20 ft, uses assistive device, slower speed, mild gait deviations, deviates 6-10 in outside 12 in walkway width. Ambulates 20 ft in less than 9 sec but greater than 7 sec.    Ambulating Backwards Walks 20 ft, uses assistive device, slower speed, mild gait deviations, deviates 6-10 in outside 12 in walkway width.    Steps Two feet to a stair, must use rail.    Total Score 22    FGA comment: 19-24 = medium risk fall                        Objective measurements completed on examination: See above findings.                PT Education - 10/17/21 1251     Education Details Provided education on evaluation results, MS disease process, MS society booklet on fatigue and stretching    Person(s) Educated Patient    Methods Explanation    Comprehension Verbalized understanding              PT Short Term Goals - 10/17/21 1259       PT SHORT TERM GOAL #1   Title Pt will initiate HEP in order to indicate improved functional mobility and dec fall risk.  (Target Date: 10/31/21)    Time 2    Period Weeks    Status New    Target Date 10/31/21               PT Long Term Goals - 10/17/21 1300       PT LONG TERM GOAL #1   Title Pt will be independent with HEP in order to indicate improved funtional mobility and balance. (Target Date: 12/01/21)    Time 6    Period Weeks    Status New    Target Date 12/01/21      PT LONG TERM GOAL #2   Title Pt will improve FGA to >/=28/30 in order to indicate dec fall risk.    Time 6    Period Weeks    Status New      PT LONG TERM GOAL #3   Title Pt will perform 5TSS in </=11.5 secs without UE support  in order to indicate improved functional strength.    Time 6    Period Weeks    Status New      PT LONG TERM GOAL #4   Title Pt will report returning to hiking in order to indicate safe return to community activity.    Time 6    Period Weeks    Status New      PT LONG TERM GOAL #5   Title Pt will negotiate up/down 12 stairs in alternating pattern holding light item with BUEs in order to indicate safe household mobility while performing ADLs.    Time 6    Period Weeks    Status New                    Plan - 10/17/21 1253     Clinical Impression Statement  Pt presents with recent diagnosis of MS upon hospitalization due to BLE paresthesias on 09/24/21 with imaging done showing foci and brain and spine. Note history of asthma, otherwise was healthy prior to diagnosis/exacerbation.  She did note a decline in function just prior to diagnosis reporting increased fatigue with things that did not used to.  Upon evaluation, note gait speed of 3.33 ft/sec which is safe for community ambulation but less than normal for her age, 5TSS time of 13.19 secs without UE support indicative of dec funtional strength and FGA of 22/30 indicative of medium fall risk.  Pt will benefit from short episode of care to provide education on MS and improve strength and balance.    Personal Factors and Comorbidities Comorbidity 2    Comorbidities see above    Examination-Activity Limitations Locomotion Level;Stairs;Squat    Examination-Participation Restrictions Community Activity;Occupation    Stability/Clinical Decision Making Evolving/Moderate complexity    Clinical Decision Making Moderate    Rehab Potential Excellent    PT Frequency 1x / week    PT Duration 6 weeks    PT Treatment/Interventions ADLs/Self Care Home Management;Aquatic Therapy;Gait training;Stair training;Functional mobility training;Therapeutic activities;Therapeutic exercise;Balance training;Neuromuscular re-education;Patient/family  education;Energy conservation;Vestibular    PT Next Visit Plan Initiate HEP for high level balance and functional strength, stairs-work on doing with less UE support, continue to educate/answer any questions regarding MS    Consulted and Agree with Plan of Care Patient             Patient will benefit from skilled therapeutic intervention in order to improve the following deficits and impairments:  Decreased activity tolerance, Decreased balance, Decreased coordination, Decreased mobility, Dizziness, Impaired sensation (had mentioned dizziness in previous MD note, did not report today but will include in POC)  Visit Diagnosis: Unsteadiness on feet  Muscle weakness (generalized)  Impaired sensation     Problem List Patient Active Problem List   Diagnosis Date Noted   Demyelinating disease of central nervous system, unspecified ([place]) 09/25/2021   Paresthesia of both legs 09/24/2021   Elevated blood-pressure reading without diagnosis of hypertension 09/24/2021   Anxiety 09/24/2021   Asthma 06/29/2021   Family history of thyroid disease 06/29/2021   Cat allergies 03/12/2013   Morbid obesity ([place]) 12/25/2012   Hypercholesterolemia 12/25/2012    Cameron Sprang, PT, MPT [facility] [address] [place], [place], [address] Phone: [phone]   Fax:  [phone] 10/17/21, 1:30 PM   Name: MICHALINE KINDIG MRN: [id number] Date of Birth: March 25, 1986

## 2021-10-24 ENCOUNTER — Other Ambulatory Visit: Payer: Self-pay

## 2021-10-24 ENCOUNTER — Ambulatory Visit: Payer: BC Managed Care – PPO | Attending: Internal Medicine | Admitting: Physical Therapy

## 2021-10-24 ENCOUNTER — Encounter: Payer: Self-pay | Admitting: Physical Therapy

## 2021-10-24 DIAGNOSIS — M6281 Muscle weakness (generalized): Secondary | ICD-10-CM | POA: Diagnosis not present

## 2021-10-24 DIAGNOSIS — R201 Hypoesthesia of skin: Secondary | ICD-10-CM | POA: Insufficient documentation

## 2021-10-24 DIAGNOSIS — R2681 Unsteadiness on feet: Secondary | ICD-10-CM | POA: Insufficient documentation

## 2021-10-24 NOTE — Therapy (Signed)
[facility] [address] [place], [place], [address] Phone: [phone]   Fax:  [phone]  Physical Therapy Treatment  Patient Details  Name: Jean Woods MRN: [id number] Date of Birth: 1986/09/08 Referring Provider (PT): Charlynne Cousins, MD   Encounter Date: 10/24/2021   PT End of Session - 10/24/21 0723     Visit Number 2    Number of Visits 7    Date for PT Re-Evaluation 12/01/21    Authorization Type BCBS    PT Start Time 0720    PT Stop Time 0800    PT Time Calculation (min) 40 min    Activity Tolerance Patient tolerated treatment well    Behavior During Therapy [facility] for tasks assessed/performed             Past Medical History:  Diagnosis Date   Asthma    Chicken pox    Depression    Seasonal allergies     History reviewed. No pertinent surgical history.  There were no vitals filed for this visit.   Subjective Assessment - 10/24/21 0722     Subjective No new complaints. Having more feeling in feet and legs. No falls or pain to repot.    Pertinent History recent diagnosis with MS (09/24/21)    Limitations Walking;House hold activities;Standing    How long can you stand comfortably? an hour or so    How long can you walk comfortably? about a mile (used to go hiking)    Patient Stated Goals "I want to have the knowledge on how to make myself mobile again if I have another flare up."    Currently in Pain? No/denies    Pain Score 0-No pain                   [facility] Adult PT Treatment/Exercise - 10/24/21 0728       Transfers   Transfers Sit to Stand;Stand to Sit    Sit to Stand 6: Modified independent (Device/Increase time)    Stand to Sit 6: Modified independent (Device/Increase time)      Ambulation/Gait   Ambulation/Gait Yes    Ambulation/Gait Assistance 6: Modified independent (Device/Increase time);5: Supervision    Assistive device None    Gait Pattern Step-through  pattern;Decreased arm swing - right;Decreased arm swing - left    Ambulation Surface Level;Indoor      Self-Care   Self-Care Other Self-Care Comments    Other Self-Care Comments  Discussed use of walking sticks/trekking poles when pt returns to walking trials at park to assist with balance. Pt then asking about walking at [facility] coming up. Discussed pacing with rest breaks thoughout day so not to over fatigue or excerbate MS symptoms.      Exercises   Exercises Other Exercises    Other Exercises  issued HEP for strengthening and balance. Refer to [place] for full details. Min guard assist for safety with cues on form and technique.                 Balance Exercises - 10/24/21 0754       Balance Exercises: Standing   Balance Beam standing across blue foam beam: alternaring forward stepping to floor/back onto beam, then alternating backward stepping to floor/back onto beam for ~10 reps each/each way. light to no UE support on bars with cues on posture and step length/weight shifting.    Tandem Gait Forward;Retro;Upper extremity support;Foam/compliant surface;3 reps;Limitations    Tandem Gait  Limitations on blue foam beam with light UE support on bars, cues on step placement on beam. min guard assist for safety.    Sidestepping Foam/compliant support;Upper extremity support;3 reps;Limitations    Sidestepping Limitations on blue foam beam with light to no UE support (hands hovering over bar) with cues on posture, step length/height. min guard assist for safety.            Issued to HEP this session: Access Code: [email or web address] URL: [email or web address] Date: 10/24/2021 Prepared by: Willow Ora  Exercises Sit to Stand with Arms Crossed - 1 x daily - 5 x weekly - 1 sets - 10 reps Tandem Walking with Counter Support - 1 x daily - 5 x weekly - 1 sets - 3 reps Toe Walking with Counter Support - 1 x daily - 5 x weekly - 1 sets - 3 reps Standing Single Leg  Stance with Counter Support - 1 x daily - 5 x weekly - 1 sets - 3 reps - 15 hold Standing Near Stance in Corner with Eyes Closed - 1 x daily - 5 x weekly - 1 sets - 10 reps Standing Balance in Corner with Eyes Closed - 1 x daily - 5 x weekly - 1 sets - 10 reps    PT Education - 10/24/21 0747     Education Details provided HEP for strengthening and balance    Person(s) Educated Patient    Methods Explanation;Demonstration;Verbal cues;Handout    Comprehension Verbalized understanding;Returned demonstration;Verbal cues required;Need further instruction              PT Short Term Goals - 10/17/21 1259       PT SHORT TERM GOAL #1   Title Pt will initiate HEP in order to indicate improved functional mobility and dec fall risk.  (Target Date: 10/31/21)    Time 2    Period Weeks    Status New    Target Date 10/31/21               PT Long Term Goals - 10/17/21 1300       PT LONG TERM GOAL #1   Title Pt will be independent with HEP in order to indicate improved funtional mobility and balance. (Target Date: 12/01/21)    Time 6    Period Weeks    Status New    Target Date 12/01/21      PT LONG TERM GOAL #2   Title Pt will improve FGA to >/=28/30 in order to indicate dec fall risk.    Time 6    Period Weeks    Status New      PT LONG TERM GOAL #3   Title Pt will perform 5TSS in </=11.5 secs without UE support in order to indicate improved functional strength.    Time 6    Period Weeks    Status New      PT LONG TERM GOAL #4   Title Pt will report returning to hiking in order to indicate safe return to community activity.    Time 6    Period Weeks    Status New      PT LONG TERM GOAL #5   Title Pt will negotiate up/down 12 stairs in alternating pattern holding light item with BUEs in order to indicate safe household mobility while performing ADLs.    Time 6    Period Weeks    Status New  Plan - 10/24/21 0723     Clinical Impression  Statement Today's skilled session initially focused on establishment of an HEP to address strengthening and balance. No issues noted or reported in session. Remainder of session continued to address balance training on compliant surfaces with no issues reported, up to min assist or UE support needed for balance. The pt is making progress toward goals and should benefit from continued PT to progress toward unmet goals.    Personal Factors and Comorbidities Comorbidity 2    Comorbidities see above    Examination-Activity Limitations Locomotion Level;Stairs;Squat    Examination-Participation Restrictions Community Activity;Occupation    Stability/Clinical Decision Making Evolving/Moderate complexity    Rehab Potential Excellent    PT Frequency 1x / week    PT Duration 6 weeks    PT Treatment/Interventions ADLs/Self Care Home Management;Aquatic Therapy;Gait training;Stair training;Functional mobility training;Therapeutic activities;Therapeutic exercise;Balance training;Neuromuscular re-education;Patient/family education;Energy conservation;Vestibular    PT Next Visit Plan continued to work on LE strengthening, dynamic gait, balance on compliant surfaces with and without vision    PT Home Exercise Plan Access Code: [email or web address]    Consulted and Agree with Plan of Care Patient             Patient will benefit from skilled therapeutic intervention in order to improve the following deficits and impairments:  Decreased activity tolerance, Decreased balance, Decreased coordination, Decreased mobility, Dizziness, Impaired sensation (had mentioned dizziness in previous MD note, did not report today but will include in POC)  Visit Diagnosis: Unsteadiness on feet  Muscle weakness (generalized)  Impaired sensation     Problem List Patient Active Problem List   Diagnosis Date Noted   Demyelinating disease of central nervous system, unspecified ([place]) 09/25/2021   Paresthesia of both legs 09/24/2021    Elevated blood-pressure reading without diagnosis of hypertension 09/24/2021   Anxiety 09/24/2021   Asthma 06/29/2021   Family history of thyroid disease 06/29/2021   Cat allergies 03/12/2013   Morbid obesity ([place]) 12/25/2012   Hypercholesterolemia 12/25/2012   Willow Ora, PTA, [facility] [address], [place], [place] [address] [phone] 10/24/21, 33:58 AM   Name: Jean Woods MRN: [id number] Date of Birth: 10-19-1986

## 2021-10-24 NOTE — Patient Instructions (Signed)
Access Code: [email or web address] URL: [email or web address] Date: 10/24/2021 Prepared by: Willow Ora  Exercises Sit to Stand with Arms Crossed - 1 x daily - 5 x weekly - 1 sets - 10 reps Tandem Walking with Counter Support - 1 x daily - 5 x weekly - 1 sets - 3 reps Toe Walking with Counter Support - 1 x daily - 5 x weekly - 1 sets - 3 reps Standing Single Leg Stance with Counter Support - 1 x daily - 5 x weekly - 1 sets - 3 reps - 15 hold Standing Near Stance in Corner with Eyes Closed - 1 x daily - 5 x weekly - 1 sets - 10 reps Standing Balance in Corner with Eyes Closed - 1 x daily - 5 x weekly - 1 sets - 10 reps

## 2021-10-31 ENCOUNTER — Ambulatory Visit: Payer: BC Managed Care – PPO | Admitting: Physical Therapy

## 2021-10-31 ENCOUNTER — Other Ambulatory Visit: Payer: Self-pay

## 2021-10-31 ENCOUNTER — Encounter: Payer: Self-pay | Admitting: Physical Therapy

## 2021-10-31 DIAGNOSIS — M6281 Muscle weakness (generalized): Secondary | ICD-10-CM | POA: Diagnosis not present

## 2021-10-31 DIAGNOSIS — R2681 Unsteadiness on feet: Secondary | ICD-10-CM

## 2021-10-31 DIAGNOSIS — R201 Hypoesthesia of skin: Secondary | ICD-10-CM | POA: Diagnosis not present

## 2021-10-31 NOTE — Therapy (Signed)
[facility] [address] [place], [place], [address] Phone: [phone]   Fax:  [phone]  Physical Therapy Treatment  Patient Details  Name: Jean Woods MRN: [id number] Date of Birth: 1986/11/12 Referring Provider (PT): Charlynne Cousins, MD   Encounter Date: 10/31/2021   PT End of Session - 10/31/21 0806     Visit Number 3    Number of Visits 7    Date for PT Re-Evaluation 12/01/21    Authorization Type BCBS    PT Start Time 0803    PT Stop Time 0841    PT Time Calculation (min) 38 min    Activity Tolerance Patient tolerated treatment well    Behavior During Therapy [facility] for tasks assessed/performed             Past Medical History:  Diagnosis Date   Asthma    Chicken pox    Depression    Seasonal allergies     History reviewed. No pertinent surgical history.  There were no vitals filed for this visit.   Subjective Assessment - 10/31/21 0805     Subjective No new complaints. No falls or pain to report. HEP is going well at home, has done the counter ex's more that the corner pillow ones.    Pertinent History recent diagnosis with MS (09/24/21)    Limitations Walking;House hold activities;Standing    How long can you stand comfortably? an hour or so    How long can you walk comfortably? about a mile (used to go hiking)    Patient Stated Goals "I want to have the knowledge on how to make myself mobile again if I have another flare up."    Currently in Pain? No/denies    Pain Score 0-No pain                      [facility] Adult PT Treatment/Exercise - 10/31/21 0807       Transfers   Transfers Sit to Stand;Stand to Sit    Sit to Stand 6: Modified independent (Device/Increase time)    Stand to Sit 6: Modified independent (Device/Increase time)      Ambulation/Gait   Ambulation/Gait Yes    Ambulation/Gait Assistance 6: Modified independent (Device/Increase time);5: Supervision     Ambulation Distance (Feet) --   around clinic with session   Assistive device None    Gait Pattern Step-through pattern;Decreased arm swing - right;Decreased arm swing - left    Ambulation Surface Level;Indoor      Exercises   Exercises Other Exercises    Other Exercises  Scifit level 4.0 x 6 minutes wtih goal >/=50 steps per minute for strengthening and activity tolerance.                 Balance Exercises - 10/31/21 0819       Balance Exercises: Standing   Rockerboard Anterior/posterior;Lateral;EO;EC;Head turns;30 seconds;Other reps (comment);Intermittent UE support;Limitations    Rockerboard Limitations performed both way on balance board wtih no UE support (occasional touch to bars): rocking the board with emphasis on tall posture with EO, progressing to EC, then holding the board steady for EC 30 sec's x 3 reps, progressing to EC head movements left<>right, up<>down for ~10 reps each. min guard to min assist for balance. cues on posture and weight shifitng for balance assitance.    Tandem Gait Forward;Retro;Upper extremity support;Foam/compliant surface;3 reps;Limitations    Tandem Gait Limitations on blue foam beam with light  UE support on bars, cues on step placement on beam. min guard assist for safety.    Sidestepping Foam/compliant support;Upper extremity support;3 reps;Limitations    Sidestepping Limitations on blue foam beam with light to no UE support (hands hovering over bar) with cues on posture, step length/height. min guard assist for safety.    Sit to Stand Standard surface;Without upper extremity support;Foam/compliant surface;Limitations    Sit to Stand Limitations feet across red foam beam: sit<>stands with no UE support for 10 reps with emphasis on tall posture and controlled descent.                  PT Short Term Goals - 10/17/21 1259       PT SHORT TERM GOAL #1   Title Pt will initiate HEP in order to indicate improved functional mobility and dec  fall risk.  (Target Date: 10/31/21)    Time 2    Period Weeks    Status New    Target Date 10/31/21               PT Long Term Goals - 10/17/21 1300       PT LONG TERM GOAL #1   Title Pt will be independent with HEP in order to indicate improved funtional mobility and balance. (Target Date: 12/01/21)    Time 6    Period Weeks    Status New    Target Date 12/01/21      PT LONG TERM GOAL #2   Title Pt will improve FGA to >/=28/30 in order to indicate dec fall risk.    Time 6    Period Weeks    Status New      PT LONG TERM GOAL #3   Title Pt will perform 5TSS in </=11.5 secs without UE support in order to indicate improved functional strength.    Time 6    Period Weeks    Status New      PT LONG TERM GOAL #4   Title Pt will report returning to hiking in order to indicate safe return to community activity.    Time 6    Period Weeks    Status New      PT LONG TERM GOAL #5   Title Pt will negotiate up/down 12 stairs in alternating pattern holding light item with BUEs in order to indicate safe household mobility while performing ADLs.    Time 6    Period Weeks    Status New                   Plan - 10/31/21 0807     Clinical Impression Statement Today's skilled session continued to focus on strengthening and high level balance training with no issues noted or reported in session. The pt is making steady progress toward goals and should benefit from continued PT to progress toward unmet goals.    Personal Factors and Comorbidities Comorbidity 2    Comorbidities see above    Examination-Activity Limitations Locomotion Level;Stairs;Squat    Examination-Participation Restrictions Community Activity;Occupation    Stability/Clinical Decision Making Evolving/Moderate complexity    Rehab Potential Excellent    PT Frequency 1x / week    PT Duration 6 weeks    PT Treatment/Interventions ADLs/Self Care Home Management;Aquatic Therapy;Gait training;Stair  training;Functional mobility training;Therapeutic activities;Therapeutic exercise;Balance training;Neuromuscular re-education;Patient/family education;Energy conservation;Vestibular    PT Next Visit Plan continued to work on LE strengthening, dynamic gait, balance on compliant surfaces with and without vision  PT Home Exercise Plan Access Code: [email or web address]    Consulted and Agree with Plan of Care Patient             Patient will benefit from skilled therapeutic intervention in order to improve the following deficits and impairments:  Decreased activity tolerance, Decreased balance, Decreased coordination, Decreased mobility, Dizziness, Impaired sensation (had mentioned dizziness in previous MD note, did not report today but will include in POC)  Visit Diagnosis: Unsteadiness on feet  Muscle weakness (generalized)     Problem List Patient Active Problem List   Diagnosis Date Noted   Demyelinating disease of central nervous system, unspecified ([place]) 09/25/2021   Paresthesia of both legs 09/24/2021   Elevated blood-pressure reading without diagnosis of hypertension 09/24/2021   Anxiety 09/24/2021   Asthma 06/29/2021   Family history of thyroid disease 06/29/2021   Cat allergies 03/12/2013   Morbid obesity ([place]) 12/25/2012   Hypercholesterolemia 12/25/2012    Willow Ora, PTA, [facility] [address] [place], [place] [address] [phone] 11/01/21, 19:37 AM   Name: Jean Woods MRN: [id number] Date of Birth: 01-17-1986

## 2021-11-01 ENCOUNTER — Telehealth: Payer: Self-pay

## 2021-11-01 NOTE — Telephone Encounter (Signed)
I called patient. She would like to start mayzent. I will send her the start form via mychart.  She is aware of her mildly low vit D and has increased her vitamin D to 5000u daily.

## 2021-11-01 NOTE — Telephone Encounter (Signed)
-----   Message from Britt Bottom, MD sent at 10/29/2021  6:25 PM EST ----- She was supposed to call our office back after checking with her insurance company to let us know if she preferred to go on Pyatt or [place]in.  I would be fine with her doing either and lab work is fine for either.  Could you please check with her.  If she does prefer to go on Zeposia she could also enter an open label drug study that we are doing with this medication  Additionally the vitamin D was mildly low.  She should take 5000 units OTC daily

## 2021-11-07 ENCOUNTER — Other Ambulatory Visit: Payer: Self-pay

## 2021-11-07 ENCOUNTER — Encounter: Payer: Self-pay | Admitting: Physical Therapy

## 2021-11-07 ENCOUNTER — Ambulatory Visit: Payer: BC Managed Care – PPO | Admitting: Physical Therapy

## 2021-11-07 DIAGNOSIS — R201 Hypoesthesia of skin: Secondary | ICD-10-CM | POA: Diagnosis not present

## 2021-11-07 DIAGNOSIS — R2681 Unsteadiness on feet: Secondary | ICD-10-CM

## 2021-11-07 DIAGNOSIS — M6281 Muscle weakness (generalized): Secondary | ICD-10-CM | POA: Diagnosis not present

## 2021-11-07 NOTE — Telephone Encounter (Signed)
Patient returned her signed mayzent start form to the office. She is agreeable to the ME screening. I spoke with Dr. Felecia Shelling and he also agrees that patient will need an ME screening prior to [facility] initiation.  Mayzent start form faxed to [employer]. Received a receipt of confirmation.

## 2021-11-07 NOTE — Therapy (Signed)
[facility] [address] [place], [place], [address] Phone: [phone]   Fax:  [phone]  Physical Therapy Treatment  Patient Details  Name: Jean Woods MRN: [id number] Date of Birth: [birth year] Referring Provider (PT): Charlynne Cousins, MD   Encounter Date: 11/07/2021   PT End of Session - 11/07/21 1036     Visit Number 4    Number of Visits 7    Date for PT Re-Evaluation 12/01/21    Authorization Type BCBS    PT Start Time 0849    PT Stop Time 0929    PT Time Calculation (min) 40 min    Activity Tolerance Patient tolerated treatment well    Behavior During Therapy [facility] for tasks assessed/performed             Past Medical History:  Diagnosis Date   Asthma    Chicken pox    Depression    Seasonal allergies     History reviewed. No pertinent surgical history.  There were no vitals filed for this visit.   Subjective Assessment - 11/07/21 0850     Subjective No new falls, complaints, or pain today. "Didn't do many of the exercises since last week, but I did walk a lot over the weekend."    Pertinent History recent diagnosis with MS (09/24/21)    Limitations Walking;House hold activities;Standing    How long can you stand comfortably? an hour or so    How long can you walk comfortably? about a mile (used to go hiking)    Patient Stated Goals "I want to have the knowledge on how to make myself mobile again if I have another flare up."    Currently in Pain? No/denies    Pain Onset 1 to 4 weeks ago                   [facility] Adult PT Treatment/Exercise - 11/07/21 0851       Transfers   Transfers Sit to Stand;Stand to Sit    Sit to Stand 6: Modified independent (Device/Increase time)    Stand to Sit 6: Modified independent (Device/Increase time)      Ambulation/Gait   Ambulation/Gait Yes    Ambulation/Gait Assistance 6: Modified independent (Device/Increase time);5: Supervision     Assistive device None    Gait Pattern Step-through pattern;Decreased arm swing - right;Decreased arm swing - left    Ambulation Surface Level;Indoor      High Level Balance   High Level Balance Activities Other (comment)    High Level Balance Comments Pt positioned facing // bars for safety, performing sidestepping on red/blue mats while alterning tapping cones. Performed 2 laps each direction. Pt required min guard to perform.      Exercises   Exercises Other Exercises    Other Exercises  Scifit level 4.0 x 8 minutes wtih goal >/=50 steps per minute for strengthening and activity tolerance.                 Balance Exercises - 11/07/21 0857       Balance Exercises: Standing   Rockerboard Anterior/posterior;Lateral;EO;EC;Other time (comment);Other reps (comment);Intermittent UE support;Limitations    Rockerboard Limitations performed both way on balance board wtih no UE support (occasional touch to bars): rocking the board with emphasis on tall posture with EO x 10 reps each direction. Progressed to holding board steady both directions 3 x 30 sec each with EC. Pt required intermittent touch to maintain balance.  Sit to Stand Foam/compliant surface;Without upper extremity support;Limitations    Sit to Stand Limitations Pt performed 10 reps, with feet on airex, wide BOS, and no UE support.    Other Standing Exercises Pt positioned standing in // bars on Airex. Performed alternating single tapping of cones forward, single tapping of cones across, and double tapping of cones across 10 reps each. The pt required min guard and intermittent light touch on bars.                  PT Short Term Goals - 10/17/21 1259       PT SHORT TERM GOAL #1   Title Pt will initiate HEP in order to indicate improved functional mobility and dec fall risk.  (Target Date: 10/31/21)    Time 2    Period Weeks    Status New    Target Date 10/31/21               PT Long Term Goals -  10/17/21 1300       PT LONG TERM GOAL #1   Title Pt will be independent with HEP in order to indicate improved funtional mobility and balance. (Target Date: 12/01/21)    Time 6    Period Weeks    Status New    Target Date 12/01/21      PT LONG TERM GOAL #2   Title Pt will improve FGA to >/=28/30 in order to indicate dec fall risk.    Time 6    Period Weeks    Status New      PT LONG TERM GOAL #3   Title Pt will perform 5TSS in </=11.5 secs without UE support in order to indicate improved functional strength.    Time 6    Period Weeks    Status New      PT LONG TERM GOAL #4   Title Pt will report returning to hiking in order to indicate safe return to community activity.    Time 6    Period Weeks    Status New      PT LONG TERM GOAL #5   Title Pt will negotiate up/down 12 stairs in alternating pattern holding light item with BUEs in order to indicate safe household mobility while performing ADLs.    Time 6    Period Weeks    Status New                   Plan - 11/07/21 1036     Clinical Impression Statement Today's skilled session continued to focus on LE strengthening and high level balance activities. The pt continues to make progress towards goals and should continue to benefit from further skilled PT to address functional limitations.    Personal Factors and Comorbidities Comorbidity 2    Comorbidities see above    Examination-Activity Limitations Locomotion Level;Stairs;Squat    Examination-Participation Restrictions Community Activity;Occupation    Stability/Clinical Decision Making Evolving/Moderate complexity    Rehab Potential Excellent    PT Frequency 1x / week    PT Duration 6 weeks    PT Treatment/Interventions ADLs/Self Care Home Management;Aquatic Therapy;Gait training;Stair training;Functional mobility training;Therapeutic activities;Therapeutic exercise;Balance training;Neuromuscular re-education;Patient/family education;Energy  conservation;Vestibular    PT Next Visit Plan Progress HEP. Continue to work on LE strengthening, dynamic gait, balance on compliant surfaces with and without vision    PT Home Exercise Plan Access Code: [email or web address]    Consulted and Agree with Plan of Care Patient  Patient will benefit from skilled therapeutic intervention in order to improve the following deficits and impairments:  Decreased activity tolerance, Decreased balance, Decreased coordination, Decreased mobility, Dizziness, Impaired sensation  Visit Diagnosis: Unsteadiness on feet  Muscle weakness (generalized)     Problem List Patient Active Problem List   Diagnosis Date Noted   Demyelinating disease of central nervous system, unspecified ([place]) 09/25/2021   Paresthesia of both legs 09/24/2021   Elevated blood-pressure reading without diagnosis of hypertension 09/24/2021   Anxiety 09/24/2021   Asthma 06/29/2021   Family history of thyroid disease 06/29/2021   Cat allergies 03/12/2013   Morbid obesity ([place]) 12/25/2012   Hypercholesterolemia 12/25/2012    Rondel Baton, SPTA 11/07/2021, 10:44 AM  [facility] [address] [place], [place], [address] Phone: [phone]   Fax:  [phone]  Name: ADILYNNE FITZWATER MRN: [id number] Date of Birth: 01-Mar-1986

## 2021-11-12 ENCOUNTER — Other Ambulatory Visit: Payer: Self-pay

## 2021-11-12 ENCOUNTER — Encounter: Payer: Self-pay | Admitting: Physical Therapy

## 2021-11-12 ENCOUNTER — Ambulatory Visit: Payer: BC Managed Care – PPO | Admitting: Physical Therapy

## 2021-11-12 DIAGNOSIS — M6281 Muscle weakness (generalized): Secondary | ICD-10-CM | POA: Diagnosis not present

## 2021-11-12 DIAGNOSIS — R201 Hypoesthesia of skin: Secondary | ICD-10-CM | POA: Diagnosis not present

## 2021-11-12 DIAGNOSIS — R2681 Unsteadiness on feet: Secondary | ICD-10-CM | POA: Diagnosis not present

## 2021-11-12 LAB — HM DIABETES EYE EXAM

## 2021-11-12 NOTE — Patient Instructions (Signed)
Access Code: [email or web address] URL: [email or web address] Date: 11/12/2021 Prepared by: Oneita Kras  Exercises Romberg Stance Eyes Closed on Foam Pad - 1-2 x daily - 5 x weekly - 2 sets - 10 reps Tandem Stance on Foam Pad with Eyes Open - 1-2 x daily - 5 x weekly - 2 sets - 10 reps Walking [place] - 1-2 x daily - 5 x weekly - 2 sets - 10 reps - 2 hold Quadruped Alternating Leg Extensions - 1-2 x daily - 5 x weekly - 2 sets - 10 reps - 3 hold Bird Dog - 1-2 x daily - 5 x weekly - 2 sets - 10 reps - 3 hold

## 2021-11-12 NOTE — Therapy (Signed)
[facility] [address] [place], [place], [address] Phone: [phone]   Fax:  [phone]  Physical Therapy Treatment  Patient Details  Name: Jean Woods MRN: [id number] Date of Birth: 02-09-1986 Referring Provider (PT): Charlynne Cousins, MD   Encounter Date: 11/12/2021   PT End of Session - 11/12/21 0946     Visit Number 5    Number of Visits 7    Date for PT Re-Evaluation 12/01/21    Authorization Type BCBS    PT Start Time 0932    PT Stop Time 1012    PT Time Calculation (min) 40 min    Activity Tolerance Patient tolerated treatment well    Behavior During Therapy [facility] for tasks assessed/performed             Past Medical History:  Diagnosis Date   Asthma    Chicken pox    Depression    Seasonal allergies     History reviewed. No pertinent surgical history.  There were no vitals filed for this visit.   Subjective Assessment - 11/12/21 0934     Subjective [place]. No issues with balance.  Balance issues walking backwards. Randomly will dizzy.    Pertinent History recent diagnosis with MS (09/24/21)    Limitations Walking;House hold activities;Standing    How long can you stand comfortably? an hour or so    How long can you walk comfortably? about a mile (used to go hiking)    Patient Stated Goals "I want to have the knowledge on how to make myself mobile again if I have another flare up."    Pain Onset 1 to 4 weeks ago              Pt performed updated HEP below with cues for body mechanics and technique and education on how to progress exercises at home. Exercises Romberg Stance Eyes Closed on Foam Pad - 1-2 x daily - 5 x weekly - 2 sets - 10 reps  Tandem Stance on Foam Pad with Eyes Open - 1-2 x daily - 5 x weekly - 2 sets - 10 reps  (intermittent UE support) Walking [place] - 1-2 x daily - 5 x weekly - 2 sets - 10 reps - 2 hold  (intermittent UE  support) Quadruped Alternating Leg Extensions - 1-2 x daily - 5 x weekly - 2 sets - 10 reps - 3 hold  (cues for core activation) Bird Dog - 1-2 x daily - 5 x weekly - 2 sets - 10 reps - 3 hold                        PT Education - 11/12/21 1005     Education Details Updated HEP progressing balance and core/LE strengthening    Methods Explanation;Demonstration;Handout;Verbal cues    Comprehension Verbalized understanding              PT Short Term Goals - 10/17/21 1259       PT SHORT TERM GOAL #1   Title Pt will initiate HEP in order to indicate improved functional mobility and dec fall risk.  (Target Date: 10/31/21)    Time 2    Period Weeks    Status New    Target Date 10/31/21               PT Long Term Goals - 10/17/21 1300  PT LONG TERM GOAL #1   Title Pt will be independent with HEP in order to indicate improved funtional mobility and balance. (Target Date: 12/01/21)    Time 6    Period Weeks    Status New    Target Date 12/01/21      PT LONG TERM GOAL #2   Title Pt will improve FGA to >/=28/30 in order to indicate dec fall risk.    Time 6    Period Weeks    Status New      PT LONG TERM GOAL #3   Title Pt will perform 5TSS in </=11.5 secs without UE support in order to indicate improved functional strength.    Time 6    Period Weeks    Status New      PT LONG TERM GOAL #4   Title Pt will report returning to hiking in order to indicate safe return to community activity.    Time 6    Period Weeks    Status New      PT LONG TERM GOAL #5   Title Pt will negotiate up/down 12 stairs in alternating pattern holding light item with BUEs in order to indicate safe household mobility while performing ADLs.    Time 6    Period Weeks    Status New                   Plan - 11/12/21 1015     Clinical Impression Statement Pt is motivated to progress HEP for balance and strength and is making good progress with functional  community moblity.    Personal Factors and Comorbidities Comorbidity 2    Comorbidities see above    Examination-Activity Limitations Locomotion Level;Stairs;Squat    Examination-Participation Restrictions Community Activity;Occupation    Stability/Clinical Decision Making Evolving/Moderate complexity    Rehab Potential Excellent    PT Frequency 1x / week    PT Duration 6 weeks    PT Treatment/Interventions ADLs/Self Care Home Management;Aquatic Therapy;Gait training;Stair training;Functional mobility training;Therapeutic activities;Therapeutic exercise;Balance training;Neuromuscular re-education;Patient/family education;Energy conservation;Vestibular    PT Next Visit Plan Progress HEP. Continue to work on LE strengthening, dynamic gait, balance on compliant surfaces with and without vision    PT Home Exercise Plan Access Code: [email or web address]    Consulted and Agree with Plan of Care Patient             Patient will benefit from skilled therapeutic intervention in order to improve the following deficits and impairments:  Decreased activity tolerance, Decreased balance, Decreased coordination, Decreased mobility, Dizziness, Impaired sensation  Visit Diagnosis: Unsteadiness on feet  Muscle weakness (generalized)     Problem List Patient Active Problem List   Diagnosis Date Noted   Demyelinating disease of central nervous system, unspecified ([place]) 09/25/2021   Paresthesia of both legs 09/24/2021   Elevated blood-pressure reading without diagnosis of hypertension 09/24/2021   Anxiety 09/24/2021   Asthma 06/29/2021   Family history of thyroid disease 06/29/2021   Cat allergies 03/12/2013   Morbid obesity ([place]) 12/25/2012   Hypercholesterolemia 12/25/2012    Bjorn Loser, PTA  11/12/21, 12:36 PM   [facility] [address] [place], [place], [address] Phone: [phone]   Fax:  [phone]  Name: Jean Woods MRN: [id number] Date of Birth: 1986-07-03

## 2021-11-14 NOTE — Telephone Encounter (Signed)
PA for [employer] kit completed via CMM. Sent to [employer]. Key: [id number].

## 2021-11-14 NOTE — Telephone Encounter (Signed)
Unable to complete the mayzent 2mg  PA via CMM using the key provided of [id number].  I completed PA form and faxed back to [facility] PA dept. Received a receipt of confirmation.

## 2021-11-21 ENCOUNTER — Ambulatory Visit: Payer: BC Managed Care – PPO | Admitting: Physical Therapy

## 2021-11-21 ENCOUNTER — Other Ambulatory Visit: Payer: Self-pay

## 2021-11-21 ENCOUNTER — Encounter: Payer: Self-pay | Admitting: Physical Therapy

## 2021-11-21 DIAGNOSIS — R2681 Unsteadiness on feet: Secondary | ICD-10-CM

## 2021-11-21 DIAGNOSIS — M6281 Muscle weakness (generalized): Secondary | ICD-10-CM | POA: Diagnosis not present

## 2021-11-21 DIAGNOSIS — R201 Hypoesthesia of skin: Secondary | ICD-10-CM | POA: Diagnosis not present

## 2021-11-21 NOTE — Therapy (Signed)
[facility] [address] [place], [place], [address] Phone: [phone]   Fax:  [phone]  Physical Therapy Treatment  Patient Details  Name: Jean Woods MRN: [id number] Date of Birth: 1986-06-30 Referring Provider (PT): Charlynne Cousins, MD   Encounter Date: 11/21/2021   PT End of Session - 11/21/21 0910     Visit Number 6    Number of Visits 7    Date for PT Re-Evaluation 12/01/21    Authorization Type BCBS    PT Start Time [phone]   Pt running late due to traffic   PT Stop Time 0844    PT Time Calculation (min) 37 min    Activity Tolerance Patient tolerated treatment well    Behavior During Therapy [facility] for tasks assessed/performed             Past Medical History:  Diagnosis Date   Asthma    Chicken pox    Depression    Seasonal allergies     History reviewed. No pertinent surgical history.  There were no vitals filed for this visit.   Subjective Assessment - 11/21/21 0809     Subjective No new complaints, falls, or pain. "[place]. And those new exercises are definitely harder, but going well."    Pertinent History recent diagnosis with MS (09/24/21)    Limitations Walking;House hold activities;Standing    How long can you stand comfortably? an hour or so    How long can you walk comfortably? about a mile (used to go hiking)    Patient Stated Goals "I want to have the knowledge on how to make myself mobile again if I have another flare up."    Currently in Pain? No/denies    Pain Score 0-No pain               [facility] Adult PT Treatment/Exercise - 11/21/21 0811       Transfers   Transfers Sit to Stand;Stand to Sit    Sit to Stand 6: Modified independent (Device/Increase time)    Stand to Sit 6: Modified independent (Device/Increase time)      Ambulation/Gait   Ambulation/Gait Yes    Ambulation/Gait Assistance 6: Modified independent  (Device/Increase time);5: Supervision    Ambulation Distance (Feet) --   Throughout session   Assistive device None    Gait Pattern Step-through pattern;Decreased arm swing - right;Decreased arm swing - left    Ambulation Surface Level;Indoor      High Level Balance   High Level Balance Activities Backward walking;Marching forwards;Other (comment)    High Level Balance Comments Pt performed in // bars: forward marching and backwards walking on blue mat x 5 each. Pt performed with supervision and no UE support. Pt performed next to counter for safety: Pt performed walking forwards over unlevel red mat, accross balance stones, and forward stepping over different heigth hurdles on blue mat. The pt required min guard to perform 4 x down and back. The pt demonstrated no LOB, but instances of slowed pace when stepping over hurdles.      Exercises   Exercises Knee/Hip      Knee/Hip Exercises: Standing   Functional Squat 1 set;10 reps;Limitations    Functional Squat Limitations squat and standing into B heel raise x 10 at edge of mat.    Other Standing Knee Exercises Standing monster walks forwards with red theraband around knees ~20 ft x 4.  Standing lateral monster walks with red theraband around knees ~25 ft x 2 each direction.               Balance Exercises - 11/21/21 0817       Balance Exercises: Standing   SLS Foam/compliant surface;Eyes open;5 reps;Limitations;Other (comment)    SLS Limitations Pt in // bars: Pt performed SLS on airex while hovering LE over cone (~5-10 sec) x 5 each side. The pt required min guard and intermittent UE support to perform. The pt demonstrated a significant amount of ankle strategy to maintain balance on both LE's.    Sit to Stand Without upper extremity support;Limitations;Other (comment)    Sit to Stand Limitations The pt performed sit<>stands from standard height mat and LE's on rockerboard. Pt performed 10 x each with rockerboard in each direction.                PT Short Term Goals - 10/17/21 1259       PT SHORT TERM GOAL #1   Title Pt will initiate HEP in order to indicate improved functional mobility and dec fall risk.  (Target Date: 10/31/21)    Time 2    Period Weeks    Status New    Target Date 10/31/21               PT Long Term Goals - 10/17/21 1300       PT LONG TERM GOAL #1   Title Pt will be independent with HEP in order to indicate improved funtional mobility and balance. (Target Date: 12/01/21)    Time 6    Period Weeks    Status New    Target Date 12/01/21      PT LONG TERM GOAL #2   Title Pt will improve FGA to >/=28/30 in order to indicate dec fall risk.    Time 6    Period Weeks    Status New      PT LONG TERM GOAL #3   Title Pt will perform 5TSS in </=11.5 secs without UE support in order to indicate improved functional strength.    Time 6    Period Weeks    Status New      PT LONG TERM GOAL #4   Title Pt will report returning to hiking in order to indicate safe return to community activity.    Time 6    Period Weeks    Status New      PT LONG TERM GOAL #5   Title Pt will negotiate up/down 12 stairs in alternating pattern holding light item with BUEs in order to indicate safe household mobility while performing ADLs.    Time 6    Period Weeks    Status New              Plan - 11/21/21 0910     Clinical Impression Statement Today's skilled session was focused on exercises for LE strengthening and high level balance exercises. The pt performed all interventions with no issues noted and rest breaks as needed. The pt could continue to benefit from further skilled PT to address remaining functional deficits.    Personal Factors and Comorbidities Comorbidity 2    Comorbidities see above    Examination-Activity Limitations Locomotion Level;Stairs;Squat    Examination-Participation Restrictions Community Activity;Occupation    Stability/Clinical Decision Making Evolving/Moderate  complexity    Rehab Potential Excellent    PT Frequency 1x / week    PT Duration 6 weeks  PT Treatment/Interventions ADLs/Self Care Home Management;Aquatic Therapy;Gait training;Stair training;Functional mobility training;Therapeutic activities;Therapeutic exercise;Balance training;Neuromuscular re-education;Patient/family education;Energy conservation;Vestibular    PT Next Visit Plan Continue to work on LE strengthening, dynamic gait and balance on compliant surfaces with and without vision    PT Home Exercise Plan Access Code: [email or web address]    Consulted and Agree with Plan of Care Patient                     Patient will benefit from skilled therapeutic intervention in order to improve the following deficits and impairments:  Decreased activity tolerance, Decreased balance, Decreased coordination, Decreased mobility, Dizziness, Impaired sensation  Visit Diagnosis: Unsteadiness on feet  Muscle weakness (generalized)     Problem List Patient Active Problem List   Diagnosis Date Noted   Demyelinating disease of central nervous system, unspecified ([place]) 09/25/2021   Paresthesia of both legs 09/24/2021   Elevated blood-pressure reading without diagnosis of hypertension 09/24/2021   Anxiety 09/24/2021   Asthma 06/29/2021   Family history of thyroid disease 06/29/2021   Cat allergies 03/12/2013   Morbid obesity ([place]) 12/25/2012   Hypercholesterolemia 12/25/2012    Rondel Baton, SPTA 11/21/2021, 9:16 AM  [facility] [address] [place], [place], [address] Phone: [phone]   Fax:  [phone]  Name: Jean Woods MRN: [id number] Date of Birth: Jul 03, 1986  This note has been reviewed and edited by supervising CI.   Willow Ora, PTA, [facility] [address] [place], [place] [address] [phone] 11/21/21, 9:25 AM

## 2021-11-21 NOTE — Telephone Encounter (Signed)
Mayzent starter and 2mg  maintenance dose approved by [place] from 11/19/2021-11/19/2022. Ref #: [id number].

## 2021-11-26 NOTE — Telephone Encounter (Signed)
I called patient. She has not had any calls from the [facility] regarding [place]. She received a call that may have been [employer] to discuss the [place] but she needs to call them back. I will check with [employer] on the SP issue.

## 2021-11-28 ENCOUNTER — Other Ambulatory Visit: Payer: Self-pay

## 2021-11-28 ENCOUNTER — Encounter: Payer: Self-pay | Admitting: Rehabilitation

## 2021-11-28 ENCOUNTER — Ambulatory Visit: Payer: BC Managed Care – PPO | Attending: Internal Medicine | Admitting: Rehabilitation

## 2021-11-28 DIAGNOSIS — R2681 Unsteadiness on feet: Secondary | ICD-10-CM | POA: Diagnosis not present

## 2021-11-28 DIAGNOSIS — M6281 Muscle weakness (generalized): Secondary | ICD-10-CM | POA: Diagnosis not present

## 2021-11-28 NOTE — Patient Instructions (Signed)
Access Code: [email or web address] URL: [email or web address] Date: 11/28/2021 Prepared by: Cameron Sprang  Exercises Romberg Stance Eyes Closed on Foam Pad - 1-2 x daily - 5 x weekly - 2 sets - 10 reps Tandem Stance on Foam Pad with Eyes Open - 1-2 x daily - 5 x weekly - 2 sets - 10 reps Walking March - 1-2 x daily - 5 x weekly - 2 sets - 10 reps - 2 hold Quadruped Alternating Leg Extensions - 1-2 x daily - 5 x weekly - 2 sets - 10 reps - 3 hold Bird Dog - 1-2 x daily - 5 x weekly - 2 sets - 10 reps - 3 hold

## 2021-11-28 NOTE — Therapy (Signed)
[facility] [address] [place], [place], [address] Phone: [phone]   Fax:  [phone]  Physical Therapy Treatment and D/C Summary   Patient Details  Name: Jean Woods MRN: [id number] Date of Birth: [birth year] Referring Provider (PT): Charlynne Cousins, MD   Encounter Date: 11/28/2021   PT End of Session - 11/28/21 0838     Visit Number 7    Number of Visits 7    Date for PT Re-Evaluation 12/01/21    Authorization Type BCBS    PT Start Time 0807   pt arrived late, D/C visit, did not need whole time   PT Stop Time 0835    PT Time Calculation (min) 28 min    Activity Tolerance Patient tolerated treatment well    Behavior During Therapy [facility] for tasks assessed/performed             Past Medical History:  Diagnosis Date   Asthma    Chicken pox    Depression    Seasonal allergies     History reviewed. No pertinent surgical history.  There were no vitals filed for this visit.   Subjective Assessment - 11/28/21 0808     Subjective Reports has began yoga again at home.  Is sore from that.    Pertinent History recent diagnosis with MS (09/24/21)    Limitations Walking;House hold activities;Standing    Patient Stated Goals "I want to have the knowledge on how to make myself mobile again if I have another flare up."    Currently in Pain? Yes    Pain Score 1     Pain Location Generalized    Pain Descriptors / Indicators Sore    Pain Type Acute pain    Pain Onset Yesterday    Aggravating Factors  yoga    Pain Relieving Factors rest                [facility] PT Assessment - 11/28/21 0814       Functional Gait  Assessment   Gait assessed  Yes    Gait Level Surface Walks 20 ft in less than 5.5 sec, no assistive devices, good speed, no evidence for imbalance, normal gait pattern, deviates no more than 6 in outside of the 12 in walkway width.    Change in Gait Speed Able to smoothly change walking  speed without loss of balance or gait deviation. Deviate no more than 6 in outside of the 12 in walkway width.    Gait with Horizontal Head Turns Performs head turns smoothly with no change in gait. Deviates no more than 6 in outside 12 in walkway width    Gait with Vertical Head Turns Performs head turns with no change in gait. Deviates no more than 6 in outside 12 in walkway width.    Gait and Pivot Turn Pivot turns safely within 3 sec and stops quickly with no loss of balance.    Step Over Obstacle Is able to step over 2 stacked shoe boxes taped together (9 in total height) without changing gait speed. No evidence of imbalance.    Gait with Narrow Base of Support Is able to ambulate for 10 steps heel to toe with no staggering.    Gait with Eyes Closed Walks 20 ft, uses assistive device, slower speed, mild gait deviations, deviates 6-10 in outside 12 in walkway width. Ambulates 20 ft in less than 9 sec but greater than 7 sec.    Ambulating  Backwards Walks 20 ft, no assistive devices, good speed, no evidence for imbalance, normal gait    Steps Alternating feet, no rail.    Total Score 29                           [facility] Adult PT Treatment/Exercise - 11/28/21 0001       Transfers   Transfers Sit to Stand;Stand to Sit    Sit to Stand 6: Modified independent (Device/Increase time)    Stand to Sit 6: Modified independent (Device/Increase time)      Ambulation/Gait   Ambulation/Gait Yes    Ambulation/Gait Assistance 6: Modified independent (Device/Increase time);5: Supervision    Ambulation Distance (Feet) 300 Feet    Assistive device None    Gait Pattern Step-through pattern;Decreased arm swing - right;Decreased arm swing - left    Ambulation Surface Level;Indoor      Neuro Re-ed    Neuro Re-ed Details  Reviewed HEP, still moderately challenging. See pt instructions               Access Code: [email or web address] URL: [email or web address] Date:  11/28/2021 Prepared by: Cameron Sprang  Exercises Romberg Stance Eyes Closed on Foam Pad - 1-2 x daily - 5 x weekly - 2 sets - 10 reps Tandem Stance on Foam Pad with Eyes Open - 1-2 x daily - 5 x weekly - 2 sets - 10 reps Walking March - 1-2 x daily - 5 x weekly - 2 sets - 10 reps - 2 hold Quadruped Alternating Leg Extensions - 1-2 x daily - 5 x weekly - 2 sets - 10 reps - 3 hold Bird Dog - 1-2 x daily - 5 x weekly - 2 sets - 10 reps - 3 hold      PT Education - 11/28/21 0838     Education Details Educated that if anything changes in future, to get another MD order to return to PT    Person(s) Educated Patient    Methods Explanation    Comprehension Verbalized understanding              PT Short Term Goals - 10/17/21 1259       PT SHORT TERM GOAL #1   Title Pt will initiate HEP in order to indicate improved functional mobility and dec fall risk.  (Target Date: 10/31/21)    Time 2    Period Weeks    Status New    Target Date 10/31/21               PT Long Term Goals - 11/28/21 0810       PT LONG TERM GOAL #1   Title Pt will be independent with HEP in order to indicate improved funtional mobility and balance. (Target Date: 12/01/21)    Baseline met per pt report    Time 6    Period Weeks    Status Achieved    Target Date 12/01/21      PT LONG TERM GOAL #2   Title Pt will improve FGA to >/=28/30 in order to indicate dec fall risk.    Baseline 29/30    Time 6    Period Weeks    Status Achieved      PT LONG TERM GOAL #3   Title Pt will perform 5TSS in </=11.5 secs without UE support in order to indicate improved functional strength.    Baseline 8.72 secs without UE support  Time 6    Period Weeks    Status Achieved      PT LONG TERM GOAL #4   Title Pt will report returning to hiking in order to indicate safe return to community activity.    Baseline Is planning to try hiking (weather has been poor) but [place].    Time 6    Period  Weeks    Status Partially Met      PT LONG TERM GOAL #5   Title Pt will negotiate up/down 12 stairs in alternating pattern holding light item with BUEs in order to indicate safe household mobility while performing ADLs.    Baseline met 11/28/21    Time 6    Period Weeks    Status Achieved              PHYSICAL THERAPY DISCHARGE SUMMARY  Visits from Start of Care: 7  Current functional level related to goals / functional outcomes: See LTGs above   Remaining deficits: Pt returning to all community activities at this time   Education / Equipment: HEP    Patient agrees to discharge. Patient goals were met. Patient is being discharged due to meeting the stated rehab goals.      Plan - 11/28/21 0839     Clinical Impression Statement Skilled session focused on assessment of LTGs.  Pt has met all LTGs (partially meeting hiking goal as she is going this weekend and [place]  Pt is ready for D/C.    Personal Factors and Comorbidities Comorbidity 2    Comorbidities see above    Examination-Activity Limitations Locomotion Level;Stairs;Squat    Examination-Participation Restrictions Community Activity;Occupation    Stability/Clinical Decision Making Evolving/Moderate complexity    Rehab Potential Excellent    PT Frequency 1x / week    PT Duration 6 weeks    PT Treatment/Interventions ADLs/Self Care Home Management;Aquatic Therapy;Gait training;Stair training;Functional mobility training;Therapeutic activities;Therapeutic exercise;Balance training;Neuromuscular re-education;Patient/family education;Energy conservation;Vestibular    PT Home Exercise Plan Access Code: [email or web address]    Consulted and Agree with Plan of Care Patient             Patient will benefit from skilled therapeutic intervention in order to improve the following deficits and impairments:  Decreased activity tolerance, Decreased balance, Decreased coordination, Decreased mobility, Dizziness,  Impaired sensation  Visit Diagnosis: Unsteadiness on feet  Muscle weakness (generalized)     Problem List Patient Active Problem List   Diagnosis Date Noted   Demyelinating disease of central nervous system, unspecified ([place]) 09/25/2021   Paresthesia of both legs 09/24/2021   Elevated blood-pressure reading without diagnosis of hypertension 09/24/2021   Anxiety 09/24/2021   Asthma 06/29/2021   Family history of thyroid disease 06/29/2021   Cat allergies 03/12/2013   Morbid obesity ([place]) 12/25/2012   Hypercholesterolemia 12/25/2012   Cameron Sprang, PT, MPT [facility] [address] [place], [place], [address] Phone: [phone]   Fax:  [phone] 11/28/21, 5:02 AM   Name: Jean Woods MRN: [id number] Date of Birth: March 24, 1986

## 2021-12-10 NOTE — Telephone Encounter (Signed)
I called patient to discuss if she received mayzent and is tolerating it well.  No answer, left a message asking her to call me back.

## 2021-12-10 NOTE — Telephone Encounter (Signed)
Patient returned my call. She is received her mayzent starter pack tomorrow. She will start taking it. I will check with her in a few weeks to make sure she is tolerating it well. Patient will let us know if she has questions or concerns in the interim.

## 2021-12-19 ENCOUNTER — Telehealth: Payer: Self-pay | Admitting: Neurology

## 2021-12-19 NOTE — Telephone Encounter (Signed)
Pt called was exposed to [place] wanting to know will the [place] that. Pt requesting a call back.

## 2021-12-19 NOTE — Telephone Encounter (Signed)
LVM returning pt call. 

## 2021-12-19 NOTE — Telephone Encounter (Signed)
Took call from phone staff and spoke w/ pt. She was exposed to covid yesterday. Relayed that I just spoke with Dr. Felecia Shelling. He said it would be ok to continue [place]. Monitor sx. If she develops sx, she should get tested. If unable to manage sx w/ OTC meds if she does develop sx, she should f/u w/ PCP for treatment. She verbalized understanding.

## 2021-12-25 NOTE — Telephone Encounter (Signed)
Received this note from Dr. Felecia Shelling: "It is unlikely that the dizziness is being caused by the [place].  However, if it worsens we could always consider a different DMT."  I called patient. I discussed this with her. She will also discuss the dizziness with her PCP.

## 2021-12-25 NOTE — Telephone Encounter (Signed)
I called patient to discuss how she is doing on mayzent. No answer, left a message asking her to call me back.

## 2021-12-25 NOTE — Telephone Encounter (Signed)
Patient returned my call. She reports that she started [facility] on 12/16/2021. She takes it in the evening. About an hour before she takes it, she is dizzy. She reports feeling dizzy prior to [facility] initiation but after starting mayzent it seems to have worsened.   She will follow up with PCP as well on the dizziness. I also recommended adequate hydration and falls precautions. Patient will follow up with Dr. Felecia Shelling as scheduled on 02/07/22.   I advised her that I would let Dr. Felecia Shelling know about the dizziness and call her back with any further recommendations.

## 2021-12-31 ENCOUNTER — Other Ambulatory Visit: Payer: Self-pay

## 2021-12-31 ENCOUNTER — Encounter: Payer: Self-pay | Admitting: Internal Medicine

## 2021-12-31 ENCOUNTER — Ambulatory Visit (INDEPENDENT_AMBULATORY_CARE_PROVIDER_SITE_OTHER): Payer: BC Managed Care – PPO | Admitting: Internal Medicine

## 2021-12-31 DIAGNOSIS — J4540 Moderate persistent asthma, uncomplicated: Secondary | ICD-10-CM | POA: Diagnosis not present

## 2021-12-31 DIAGNOSIS — G379 Demyelinating disease of central nervous system, unspecified: Secondary | ICD-10-CM

## 2021-12-31 DIAGNOSIS — N912 Amenorrhea, unspecified: Secondary | ICD-10-CM

## 2021-12-31 NOTE — Progress Notes (Signed)
° °  Subjective:   Patient ID: Jean Woods, female    DOB: 1986-01-22, 36 y.o.   MRN: [id number]  HPI The patient is a 36 YO female coming in for follow up. New MS diagnosis since last visit.   Review of Systems  Constitutional: Negative.   HENT: Negative.    Eyes: Negative.   Respiratory:  Negative for cough, chest tightness and shortness of breath.   Cardiovascular:  Negative for chest pain, palpitations and leg swelling.  Gastrointestinal:  Negative for abdominal distention, abdominal pain, constipation, diarrhea, nausea and vomiting.  Genitourinary:  Positive for menstrual problem.  Musculoskeletal: Negative.   Skin: Negative.   Neurological: Negative.        Decline in coordination  Psychiatric/Behavioral: Negative.     Objective:  Physical Exam Constitutional:      Appearance: She is well-developed. She is obese.  HENT:     Head: Normocephalic and atraumatic.  Cardiovascular:     Rate and Rhythm: Normal rate and regular rhythm.  Pulmonary:     Effort: Pulmonary effort is normal. No respiratory distress.     Breath sounds: Normal breath sounds. No wheezing or rales.  Abdominal:     General: Bowel sounds are normal. There is no distension.     Palpations: Abdomen is soft.     Tenderness: There is no abdominal tenderness. There is no rebound.  Musculoskeletal:     Cervical back: Normal range of motion.  Skin:    General: Skin is warm and dry.  Neurological:     Mental Status: She is alert and oriented to person, place, and time.     Coordination: Coordination normal.    Vitals:   12/31/21 0858  BP: 126/68  Pulse: 100  Resp: 18  SpO2: 98%  Weight: 252 lb 6.4 oz (114.5 kg)  Height: 5\' 4"  (1.626 m)   This visit occurred during the SARS-CoV-2 public health emergency.  Safety protocols were in place, including screening questions prior to the visit, additional usage of staff PPE, and extensive cleaning of exam room while observing appropriate contact time as  indicated for disinfecting solutions.   Assessment & Plan:

## 2021-12-31 NOTE — Patient Instructions (Signed)
Let us know if the counselor does not work out and I can find one for you.

## 2022-01-04 DIAGNOSIS — N912 Amenorrhea, unspecified: Secondary | ICD-10-CM | POA: Insufficient documentation

## 2022-01-04 NOTE — Assessment & Plan Note (Signed)
Taking mayzent and is struggling with adjustment to this new illness. Working on finding counseling through her job.

## 2022-01-04 NOTE — Assessment & Plan Note (Signed)
Weight is increased slightly since last visit and she did end up having to take steroids high dose IV for her new diagnosis of MS and we talked about how this impacts weight.

## 2022-01-04 NOTE — Assessment & Plan Note (Signed)
No flare today, taking qvar and albuterol prn.

## 2022-01-04 NOTE — Assessment & Plan Note (Signed)
Since onset of mayzent. Multiple negative pregnancy tests since that time. Could be related to medication. Asked to follow up with ob/gyn. Not on birth control use protection.

## 2022-02-07 ENCOUNTER — Encounter: Payer: Self-pay | Admitting: Neurology

## 2022-02-07 ENCOUNTER — Ambulatory Visit (INDEPENDENT_AMBULATORY_CARE_PROVIDER_SITE_OTHER): Payer: BC Managed Care – PPO | Admitting: Neurology

## 2022-02-07 VITALS — BP 162/79 | HR 89 | Ht 64.0 in | Wt 254.5 lb

## 2022-02-07 DIAGNOSIS — Z6841 Body Mass Index (BMI) 40.0 and over, adult: Secondary | ICD-10-CM

## 2022-02-07 DIAGNOSIS — R202 Paresthesia of skin: Secondary | ICD-10-CM

## 2022-02-07 DIAGNOSIS — G35 Multiple sclerosis: Secondary | ICD-10-CM | POA: Diagnosis not present

## 2022-02-07 DIAGNOSIS — Z79899 Other long term (current) drug therapy: Secondary | ICD-10-CM

## 2022-02-07 NOTE — Progress Notes (Signed)
[facility]  PATIENT: Jean Woods DOB: 1986-11-22  REFERRING DOCTOR OR PCP:  Varney Baas, MD; Pricilla Holm (PCP) SOURCE: Patient, notes from recent hospitalization, imaging and laboratory reports, multiple MRI images personally reviewed  _________________________________   HISTORICAL  CHIEF COMPLAINT:  Chief Complaint  Patient presents with   Follow-up    Rm 1, alone. Here to f/u on MS. Started on [place] and tolerating well. Pt continues to have dizzy spells.     HISTORY OF PRESENT ILLNESS:  Jean Woods is a 36 y.o. woman withf multiple sclerosis.  Update 02/07/2022: She is on [place] and feel her MS is stable or a little improved.   Gait is doing well and she has no trouble doing a couple miles.   She does not need to hold the bannister on stairs.   No numbness or tingling.  The dysesthesias she had at onset improved.    Vision is doing well.  Bladder function is normal..       She has some fatigue and is trying to get more active.   She is trying to exercise more.    She sleeps well most nights.   She feels short term memory is a little worse ove last couple years.    Sometimes se has trouble coming up with the right words.   Shefeels she has mild depression and anxiety - not bad enough for a medication.  She is seeing therapy.    She is taking 5000 U daily Vit D (was 26.5 when checked 2022)  MS History: She presented to the emergency room 09/24/2021 with paresthesias and was admitted after imaging studies showed findings consistent with multiple sclerosis.   She had numbness from her waist on down to the legs.    She felt gait was slightly affected. Strength was fine.   She was admittted and had 5 days of IV Solu-Medrol.   Numbness is slowly improving though her feet and perineum are still numb.      As the numbness resolved, she felt more sore and also felt mildly weak.      In retrospect, she had some epsidoes of numbness in her legs that  lasted a few days to 2 weeks and these episodes were not as severe as the one early October.  The first episode was in 2018 and she had 3-4 other episodes over the past 3 years.     She had blurry vision with a gray spot in the middle of her OS vision, much better the next day but some visual change for 2-3 days.    She started [facility] December 2022.     Data: While in the hospital 09/25/21-09/28/2021:  EKG rhythm strip showed normal sinus rhythm with a PR interval of 13 ms QTc = 0.34 ms.  CBC with differential, chemistries and liver function tests were normal.anti-AQP4 antibody was negative.  Vitamin D 06/28/2021 was low at 13.  She started taking 1000 units daily.  Imaging review: MRI of the brain 09/25/2021 showed multiple T2/FLAIR hyperintense foci in the periventricular, juxtacortical and deep white matter.  None of the foci enhanced after contrast.  The focus adjacent to C2-C3 is also seen.  MRI of the thoracic spine 09/25/2021 showed T2 hyperintense foci within the spinal cord adjacent to T5 (central), T7 (central), T8 (central slightly posterior into the left), T10-T11 (2 foci 1 central and 1 to the left) and T11-T12 (anterior slightly to the right).  The focus at T5 enhanced  after contrast.  MRI of the cervical spine 09/25/2021 showed a focus adjacent to C3-C4 (anterocentral) and a possible focus adjacent to C7-T1 (posterior)  MRI of the lumbar spine 09/25/2021 showed a focus at T11-T12 and a subtle focus to the left at T12.  Additionally at L4-L5 there is spinal stenosis due to disc protrusion and increased epidural fat.  There is no significant foraminal narrowing.  There is mild lateral recess stenosis but no nerve root compression.      REVIEW OF SYSTEMS: Constitutional: No fevers, chills, sweats, or change in appetite Eyes: No visual changes, double vision, eye pain Ear, nose and throat: No hearing loss, ear pain, nasal congestion, sore throat Cardiovascular: No chest pain,  palpitations Respiratory:  No shortness of breath at rest or with exertion.   No wheezes GastrointestinaI: No nausea, vomiting, diarrhea, abdominal pain, fecal incontinence Genitourinary:  No dysuria, urinary retention or frequency.  No nocturia. Musculoskeletal:  No neck pain, back pain Integumentary: No rash, pruritus, skin lesions Neurological: as above Psychiatric: No depression at this time.  No anxiety Endocrine: No palpitations, diaphoresis, change in appetite, change in weigh or increased thirst Hematologic/Lymphatic:  No anemia, purpura, petechiae. Allergic/Immunologic: No itchy/runny eyes, nasal congestion, recent allergic reactions, rashes  ALLERGIES: Allergies  Allergen Reactions   Wound Dressing Adhesive Hives    Develops rash and burning sensation where adhesive tape it placed.     HOME MEDICATIONS:  Current Outpatient Medications:    acetaminophen (TYLENOL) 500 MG tablet, Take 1,000 mg by mouth every 6 (six) hours as needed for moderate pain or headache., Disp: , Rfl:    albuterol (PROVENTIL HFA;VENTOLIN HFA) 108 (90 Base) MCG/ACT inhaler, Inhale 1 puff into the lungs every 6 (six) hours as needed for wheezing or shortness of breath., Disp: , Rfl:    beclomethasone (QVAR) 80 MCG/ACT inhaler, Inhale 1 puff into the lungs daily., Disp: , Rfl:    cholecalciferol (VITAMIN D) 25 MCG (1000 UNIT) tablet, Take 5,000 Units by mouth at bedtime., Disp: , Rfl:    famotidine (PEPCID) 20 MG tablet, Take 1 tablet (20 mg total) by mouth 2 (two) times daily. (Patient taking differently: Take 20 mg by mouth every evening.), Disp: 30 tablet, Rfl: 0   levocetirizine (XYZAL) 5 MG tablet, Take 5 mg by mouth every evening., Disp: , Rfl:    OLOPATADINE HCL NA, Place 2 sprays into both nostrils See admin instructions. Once or twice daily, Disp: , Rfl:    Siponimod Fumarate (MAYZENT) 2 MG TABS, Take 2 mg by mouth daily., Disp: , Rfl:   PAST MEDICAL HISTORY: Past Medical History:  Diagnosis Date    Asthma    Chicken pox    Depression    Seasonal allergies     PAST SURGICAL HISTORY: History reviewed. No pertinent surgical history.  FAMILY HISTORY: Family History  Problem Relation Age of Onset   Breast cancer Mother    Thyroid disease Mother    Stroke Maternal Grandmother    Thyroid disease Maternal Grandmother    Colon cancer Maternal Grandfather    Neurologic Disorder Neg Hx    Multiple sclerosis Neg Hx     SOCIAL HISTORY:  Social History   Socioeconomic History   Marital status: Married    Spouse name: Not on file   Number of children: Not on file   Years of education: Not on file   Highest education level: Not on file  Occupational History   Occupation: HR  Tobacco Use   Smoking  status: Never   Smokeless tobacco: Never  Vaping Use   Vaping Use: Never used  Substance and Sexual Activity   Alcohol use: Yes    Alcohol/week: 5.0 standard drinks    Types: 5 Cans of beer per week    Comment: weekends   Drug use: Never   Sexual activity: Not on file  Other Topics Concern   Not on file  Social History Narrative   Not on file   Social Determinants of Health   Financial Resource Strain: Not on file  Food Insecurity: Not on file  Transportation Needs: Not on file  Physical Activity: Not on file  Stress: Not on file  Social Connections: Not on file  Intimate Partner Violence: Not on file     PHYSICAL EXAM  Vitals:   02/07/22 0949  BP: (!) 162/79  Pulse: 89  Weight: 254 lb 8 oz (115.4 kg)  Height: 5\' 4"  (1.626 m)    Body mass index is 43.68 kg/m.   General: The patient is well-developed and well-nourished and in no acute distress  HEENT:  Head is [place]/AT.  Sclera are anicteric.    Skin: Extremities are without rash or  edema.  Neurologic Exam  Mental status: The patient is alert and oriented x 3 at the time of the examination. The patient has apparent normal recent and remote memory, with an apparently normal attention span and  concentration ability.   Speech is normal.  Cranial nerves: Extraocular movements are full. Pupils are equal, round, and reactive to light and accomodation.  Color vision is now symmetric facial symmetry is present.  Facial strength and sensation was normal.  No obvious hearing deficits are noted.  Motor:  Muscle bulk is normal.   Tone is normal. Strength is  5 / 5 in all 4 extremities.   Sensory: Sensory testing is intact to pinprick, soft touch and vibration sensation in all 4 extremities.  Coordination: Cerebellar testing reveals good finger-nose-finger and heel-to-shin bilaterally.  Gait and station: Station is normal.   Gait is fairly normal and tandem gait is moderately wide.  Romberg is negative.   Reflexes: Deep tendon reflexes are symmetric and normal bilaterally.   Marland Kitchen    DIAGNOSTIC DATA (LABS, IMAGING, TESTING) - I reviewed patient records, labs, notes, testing and imaging myself where available.  Lab Results  Component Value Date   WBC 11.4 (H) 09/29/2021   HGB 13.0 09/29/2021   HCT 39.3 09/29/2021   MCV 86.4 09/29/2021   PLT 338 09/29/2021      Component Value Date/Time   NA 134 (L) 09/29/2021 0311   K 3.9 09/29/2021 0311   CL 105 09/29/2021 0311   CO2 22 09/29/2021 0311   GLUCOSE 101 (H) 09/29/2021 0311   BUN 16 09/29/2021 0311   CREATININE 0.65 09/29/2021 0311   CALCIUM 8.9 09/29/2021 0311   PROT 8.1 09/24/2021 1809   ALBUMIN 4.6 09/24/2021 1809   AST 14 (L) 09/24/2021 1809   ALT 14 09/24/2021 1809   ALKPHOS 84 09/24/2021 1809   BILITOT 0.6 09/24/2021 1809   GFRNONAA >60 09/29/2021 0311   GFRAA >60 12/27/2018 1615   Lab Results  Component Value Date   CHOL 257 (H) 06/28/2021   HDL 72.30 06/28/2021   LDLCALC 170 (H) 06/28/2021   LDLDIRECT 164.5 12/11/2012   TRIG 73.0 06/28/2021   CHOLHDL 4 06/28/2021   Lab Results  Component Value Date   HGBA1C 5.0 09/27/2021   Lab Results  Component Value Date  [id number] 631 06/28/2021   Lab Results   Component Value Date   TSH 2.12 06/28/2021       ASSESSMENT AND PLAN  Multiple sclerosis ([place]) - Plan: CBC with Differential/Platelet, Comprehensive metabolic panel  High risk medication use - Plan: CBC with Differential/Platelet, Comprehensive metabolic panel  Paresthesia of both legs  BMI 40.0-44.9, adult ([place])   Her MS has been stable since starting Mayzent and we will continue.  Check CBC/D and LFT.   At next visit, check these labs and order MRI brain w/wo to assess for breakthrough activity.  If occurring, will swithch to a more efficacious DMT Stay active and exericse as tolerated Rtc 6 months   Shenica Holzheimer A. Felecia Shelling, MD, [facility] 1/95/9747, 1:85 PM Certified in Neurology, Clinical Neurophysiology, Sleep Medicine and Neuroimaging  [facility] [address] [place], [place] [address] [phone]

## 2022-02-08 LAB — COMPREHENSIVE METABOLIC PANEL
ALT: 14 IU/L (ref 0–32)
AST: 12 IU/L (ref 0–40)
Albumin/Globulin Ratio: 2 (ref 1.2–2.2)
Albumin: 4.4 g/dL (ref 3.8–4.8)
Alkaline Phosphatase: 72 IU/L (ref 44–121)
BUN/Creatinine Ratio: 14 (ref 9–23)
BUN: 11 mg/dL (ref 6–20)
Bilirubin Total: 0.6 mg/dL (ref 0.0–1.2)
CO2: 25 mmol/L (ref 20–29)
Calcium: 9.3 mg/dL (ref 8.7–10.2)
Chloride: 103 mmol/L (ref 96–106)
Creatinine, Ser: 0.77 mg/dL (ref 0.57–1.00)
Globulin, Total: 2.2 g/dL (ref 1.5–4.5)
Glucose: 82 mg/dL (ref 70–99)
Potassium: 4.7 mmol/L (ref 3.5–5.2)
Sodium: 139 mmol/L (ref 134–144)
Total Protein: 6.6 g/dL (ref 6.0–8.5)
eGFR: 101 mL/min/{1.73_m2} (ref >59)

## 2022-02-08 LAB — CBC WITH DIFFERENTIAL/PLATELET
Basophils Absolute: 0.1 10*3/uL (ref 0.0–0.2)
Basos: 1 %
EOS (ABSOLUTE): 0.3 10*3/uL (ref 0.0–0.4)
Eos: 4 %
Hematocrit: 39.7 % (ref 34.0–46.6)
Hemoglobin: 13.2 g/dL (ref 11.1–15.9)
Immature Grans (Abs): 0 10*3/uL (ref 0.0–0.1)
Immature Granulocytes: 0 %
Lymphocytes Absolute: 0.3 10*3/uL — ABNORMAL LOW (ref 0.7–3.1)
Lymphs: 4 %
MCH: 29 pg (ref 26.6–33.0)
MCHC: 33.2 g/dL (ref 31.5–35.7)
MCV: 87 fL (ref 79–97)
Monocytes Absolute: 0.8 10*3/uL (ref 0.1–0.9)
Monocytes: 11 %
Neutrophils Absolute: 5.6 10*3/uL (ref 1.4–7.0)
Neutrophils: 80 %
Platelets: 327 10*3/uL (ref 150–450)
RBC: 4.55 x10E6/uL (ref 3.77–5.28)
RDW: 13.7 % (ref 11.7–15.4)
WBC: 6.9 10*3/uL (ref 3.4–10.8)

## 2022-02-20 DIAGNOSIS — J4530 Mild persistent asthma, uncomplicated: Secondary | ICD-10-CM | POA: Diagnosis not present

## 2022-02-20 DIAGNOSIS — L501 Idiopathic urticaria: Secondary | ICD-10-CM | POA: Diagnosis not present

## 2022-02-20 DIAGNOSIS — J3089 Other allergic rhinitis: Secondary | ICD-10-CM | POA: Diagnosis not present

## 2022-07-26 ENCOUNTER — Encounter: Payer: Self-pay | Admitting: *Deleted

## 2022-08-06 ENCOUNTER — Encounter: Payer: Self-pay | Admitting: Neurology

## 2022-08-07 ENCOUNTER — Ambulatory Visit (INDEPENDENT_AMBULATORY_CARE_PROVIDER_SITE_OTHER): Payer: BC Managed Care – PPO | Admitting: Neurology

## 2022-08-07 ENCOUNTER — Encounter: Payer: Self-pay | Admitting: Neurology

## 2022-08-07 VITALS — BP 146/92 | HR 102 | Ht 64.0 in | Wt 258.0 lb

## 2022-08-07 DIAGNOSIS — G35 Multiple sclerosis: Secondary | ICD-10-CM

## 2022-08-07 DIAGNOSIS — G43109 Migraine with aura, not intractable, without status migrainosus: Secondary | ICD-10-CM

## 2022-08-07 DIAGNOSIS — R200 Anesthesia of skin: Secondary | ICD-10-CM | POA: Diagnosis not present

## 2022-08-07 DIAGNOSIS — Z79899 Other long term (current) drug therapy: Secondary | ICD-10-CM | POA: Diagnosis not present

## 2022-08-07 DIAGNOSIS — G35D Multiple sclerosis, unspecified: Secondary | ICD-10-CM

## 2022-08-07 NOTE — Progress Notes (Signed)
[facility]  PATIENT: Jean Woods DOB: 02-19-1986  REFERRING DOCTOR OR PCP:  Varney Baas, MD; Pricilla Holm (PCP) SOURCE: Patient, notes from recent hospitalization, imaging and laboratory reports, multiple MRI images personally reviewed  _________________________________   HISTORICAL  CHIEF COMPLAINT:  Chief Complaint  Patient presents with   Follow-up    Rm 2 with husband. Here for f/u on MS on Mayzent. Since last f/u 2 episodes of numbness which starts on the right side of the face and radiates down to the arm. Sx typically present for 15-40 minutes. Last event was two days ago.     HISTORY OF PRESENT ILLNESS:  Jean Woods is a 36 y.o. woman with multiple sclerosis.  Update 08/07/2022:: She is on Mayzent and feel her MS is mostly stable.   She does report twoepisodes of numbness on the right side of the face radiating down the arms present for 15 to 40 minutes.  First episode was April 2023 and last episode was 2 days ago.  Cognitively she was foggy and reading comprehension was off and she noted visual blurring more to the right for few minutes after the numbness started.   She had a headache (mild) and N/V for the next 10-12 hours.   She got nauseous if she tried to lay down.   She felt better this am.  She has not had any of these episodes before this year  Gait is doing well and she has no trouble doing a couple miles.   She does not need to hold the bannister on stairs.   No fixed numbness or tingling.  Dysesthesias she had at onset improved.    Vision is doing well.  Bladder function is normal..       She has some fatigue and is trying to get more active.   She is trying to exercise more.    She sleeps well most nights.   She notes mild short term memory issues and sometimes se has trouble coming up with the right words.   She feels she does fine at work.       She feels she has mild depression and anxiety - not bad enough for a  medication.  She is seeing therapy.    She is taking 5000 U daily Vit D (was 26.5 when checked 2022)  MS History: She presented to the emergency room 09/24/2021 with paresthesias and was admitted after imaging studies showed findings consistent with multiple sclerosis.   She had numbness from her waist on down to the legs.    She felt gait was slightly affected. Strength was fine.   She was admittted and had 5 days of IV Solu-Medrol.   Numbness is slowly improving though her feet and perineum are still numb.      As the numbness resolved, she felt more sore and also felt mildly weak.      In retrospect, she had some epsidoes of numbness in her legs that lasted a few days to 2 weeks and these episodes were not as severe as the one early October.  The first episode was in 2018 and she had 3-4 other episodes over the past 3 years.     She had blurry vision with a gray spot in the middle of her OS vision, much better the next day but some visual change for 2-3 days.    She started [facility] December 2022.     Data: While in the hospital 09/25/21-09/28/2021:  EKG  rhythm strip showed normal sinus rhythm with a PR interval of 13 ms QTc = 0.34 ms.  CBC with differential, chemistries and liver function tests were normal.anti-AQP4 antibody was negative.  Vitamin D 06/28/2021 was low at 13.  She started taking 1000 units daily.  Imaging review: MRI of the brain 09/25/2021 showed multiple T2/FLAIR hyperintense foci in the periventricular, juxtacortical and deep white matter.  None of the foci enhanced after contrast.  The focus adjacent to C2-C3 is also seen.  MRI of the thoracic spine 09/25/2021 showed T2 hyperintense foci within the spinal cord adjacent to T5 (central), T7 (central), T8 (central slightly posterior into the left), T10-T11 (2 foci 1 central and 1 to the left) and T11-T12 (anterior slightly to the right).  The focus at T5 enhanced after contrast.  MRI of the cervical spine 09/25/2021 showed a focus  adjacent to C3-C4 (anterocentral) and a possible focus adjacent to C7-T1 (posterior)  MRI of the lumbar spine 09/25/2021 showed a focus at T11-T12 and a subtle focus to the left at T12.  Additionally at L4-L5 there is spinal stenosis due to disc protrusion and increased epidural fat.  There is no significant foraminal narrowing.  There is mild lateral recess stenosis but no nerve root compression.      REVIEW OF SYSTEMS: Constitutional: No fevers, chills, sweats, or change in appetite Eyes: No visual changes, double vision, eye pain Ear, nose and throat: No hearing loss, ear pain, nasal congestion, sore throat Cardiovascular: No chest pain, palpitations Respiratory:  No shortness of breath at rest or with exertion.   No wheezes GastrointestinaI: No nausea, vomiting, diarrhea, abdominal pain, fecal incontinence Genitourinary:  No dysuria, urinary retention or frequency.  No nocturia. Musculoskeletal:  No neck pain, back pain Integumentary: No rash, pruritus, skin lesions Neurological: as above Psychiatric: No depression at this time.  No anxiety Endocrine: No palpitations, diaphoresis, change in appetite, change in weigh or increased thirst Hematologic/Lymphatic:  No anemia, purpura, petechiae. Allergic/Immunologic: No itchy/runny eyes, nasal congestion, recent allergic reactions, rashes  ALLERGIES: Allergies  Allergen Reactions   Wound Dressing Adhesive Hives    Develops rash and burning sensation where adhesive tape it placed.     HOME MEDICATIONS:  Current Outpatient Medications:    acetaminophen (TYLENOL) 500 MG tablet, Take 1,000 mg by mouth every 6 (six) hours as needed for moderate pain or headache., Disp: , Rfl:    albuterol (PROVENTIL HFA;VENTOLIN HFA) 108 (90 Base) MCG/ACT inhaler, Inhale 1 puff into the lungs every 6 (six) hours as needed for wheezing or shortness of breath., Disp: , Rfl:    beclomethasone (QVAR) 80 MCG/ACT inhaler, Inhale 1 puff into the lungs daily.,  Disp: , Rfl:    cholecalciferol (VITAMIN D) 25 MCG (1000 UNIT) tablet, Take 5,000 Units by mouth at bedtime., Disp: , Rfl:    famotidine (PEPCID) 20 MG tablet, Take 1 tablet (20 mg total) by mouth 2 (two) times daily. (Patient taking differently: Take 20 mg by mouth every evening.), Disp: 30 tablet, Rfl: 0   levocetirizine (XYZAL) 5 MG tablet, Take 5 mg by mouth every evening., Disp: , Rfl:    OLOPATADINE HCL NA, Place 2 sprays into both nostrils See admin instructions. Once or twice daily, Disp: , Rfl:    Siponimod Fumarate (MAYZENT) 2 MG TABS, Take 2 mg by mouth daily., Disp: , Rfl:   PAST MEDICAL HISTORY: Past Medical History:  Diagnosis Date   Asthma    Chicken pox    Depression  Seasonal allergies     PAST SURGICAL HISTORY: No past surgical history on file.  FAMILY HISTORY: Family History  Problem Relation Age of Onset   Breast cancer Mother    Thyroid disease Mother    Stroke Maternal Grandmother    Thyroid disease Maternal Grandmother    Colon cancer Maternal Grandfather    Neurologic Disorder Neg Hx    Multiple sclerosis Neg Hx     SOCIAL HISTORY:  Social History   Socioeconomic History   Marital status: Married    Spouse name: Not on file   Number of children: Not on file   Years of education: Not on file   Highest education level: Not on file  Occupational History   Occupation: HR  Tobacco Use   Smoking status: Never   Smokeless tobacco: Never  Vaping Use   Vaping Use: Never used  Substance and Sexual Activity   Alcohol use: Yes    Alcohol/week: 5.0 standard drinks of alcohol    Types: 5 Cans of beer per week    Comment: weekends   Drug use: Never   Sexual activity: Not on file  Other Topics Concern   Not on file  Social History Narrative   Not on file   Social Determinants of Health   Financial Resource Strain: Not on file  Food Insecurity: Not on file  Transportation Needs: Not on file  Physical Activity: Not on file  Stress: Not on file   Social Connections: Not on file  Intimate Partner Violence: Not on file     PHYSICAL EXAM  Vitals:   08/07/22 1129  BP: (!) 146/92  Pulse: (!) 102  Weight: 258 lb (117 kg)  Height: '5\' 4"'$  (1.626 m)    Body mass index is 44.29 kg/m.   General: The patient is well-developed and well-nourished and in no acute distress  HEENT:  Head is [place]/AT.  Sclera are anicteric.    Skin: Extremities are without rash or  edema.  Neurologic Exam  Mental status: The patient is alert and oriented x 3 at the time of the examination. The patient has apparent normal recent and remote memory, with an apparently normal attention span and concentration ability.   Speech is normal.  Cranial nerves: Extraocular movements are full. Pupils are equal, round, and reactive to light and accomodation.  Color vision is now symmetric facial symmetry is present.  Facial strength and sensation was normal.  No obvious hearing deficits are noted.  Motor:  Muscle bulk is normal.   Tone is normal. Strength is  5 / 5 in all 4 extremities.   Sensory: Sensory testing is intact to pinprick, soft touch and vibration sensation in all 4 extremities.  Coordination: Cerebellar testing reveals good finger-nose-finger and heel-to-shin bilaterally.  Gait and station: Station is normal.   Gait is fairly normal.  Tandem gait is mildly wide..  Romberg is negative.   Reflexes: Deep tendon reflexes are symmetric and normal bilaterally.   Marland Kitchen    DIAGNOSTIC DATA (LABS, IMAGING, TESTING) - I reviewed patient records, labs, notes, testing and imaging myself where available.  Lab Results  Component Value Date   WBC 6.9 02/07/2022   HGB 13.2 02/07/2022   HCT 39.7 02/07/2022   MCV 87 02/07/2022   PLT 327 02/07/2022      Component Value Date/Time   NA 139 02/07/2022 1039   K 4.7 02/07/2022 1039   CL 103 02/07/2022 1039   CO2 25 02/07/2022 1039   GLUCOSE  82 02/07/2022 1039   GLUCOSE 101 (H) 09/29/2021 0311   BUN 11 02/07/2022  1039   CREATININE 0.77 02/07/2022 1039   CALCIUM 9.3 02/07/2022 1039   PROT 6.6 02/07/2022 1039   ALBUMIN 4.4 02/07/2022 1039   AST 12 02/07/2022 1039   ALT 14 02/07/2022 1039   ALKPHOS 72 02/07/2022 1039   BILITOT 0.6 02/07/2022 1039   GFRNONAA >60 09/29/2021 0311   GFRAA >60 12/27/2018 1615   Lab Results  Component Value Date   CHOL 257 (H) 06/28/2021   HDL 72.30 06/28/2021   LDLCALC 170 (H) 06/28/2021   LDLDIRECT 164.5 12/11/2012   TRIG 73.0 06/28/2021   CHOLHDL 4 06/28/2021   Lab Results  Component Value Date   HGBA1C 5.0 09/27/2021   Lab Results  Component Value Date   [id number] 694 06/28/2021   Lab Results  Component Value Date   TSH 2.12 06/28/2021       ASSESSMENT AND PLAN  Multiple sclerosis ([place]) - Plan: CBC with Differential/Platelet, Hepatic function panel  High risk medication use - Plan: CBC with Differential/Platelet, Hepatic function panel  Complicated migraine  Right sided numbness   Her MS has been stable since starting Mayzent and we will continue.  Check labs today.  Around the time of her next visit we will check an MRI of the brain to determine if there is breakthrough activity and consider a different DMT if this is occurring. We discussed her 2 episodes.  These most likely represent complicated migraine.  If another episode occurs we may need to consider prophylactic therapy with a calcium channel blocker.  I would also consider symptomatic therapy with a triptan or anti-CGRP agent.   Stay active and exericse as tolerated Rtc 6 months.  Call sooner if new or worsening neurologic symptoms.   Aryonna Gunnerson A. Felecia Shelling, MD, [facility] 8/54/6270, 3:50 PM Certified in Neurology, Clinical Neurophysiology, Sleep Medicine and Neuroimaging  [facility] [address], [place], [place] [address] [phone]

## 2022-08-08 LAB — CBC WITH DIFFERENTIAL/PLATELET
Basophils Absolute: 0.1 10*3/uL (ref 0.0–0.2)
Basos: 1 %
EOS (ABSOLUTE): 0 10*3/uL (ref 0.0–0.4)
Eos: 1 %
Hematocrit: 42.6 % (ref 34.0–46.6)
Hemoglobin: 14.2 g/dL (ref 11.1–15.9)
Immature Grans (Abs): 0 10*3/uL (ref 0.0–0.1)
Immature Granulocytes: 0 %
Lymphocytes Absolute: 0.3 10*3/uL — ABNORMAL LOW (ref 0.7–3.1)
Lymphs: 4 %
MCH: 29.4 pg (ref 26.6–33.0)
MCHC: 33.3 g/dL (ref 31.5–35.7)
MCV: 88 fL (ref 79–97)
Monocytes Absolute: 0.8 10*3/uL (ref 0.1–0.9)
Monocytes: 11 %
Neutrophils Absolute: 6.4 10*3/uL (ref 1.4–7.0)
Neutrophils: 83 %
Platelets: 360 10*3/uL (ref 150–450)
RBC: 4.83 x10E6/uL (ref 3.77–5.28)
RDW: 13.7 % (ref 11.7–15.4)
WBC: 7.7 10*3/uL (ref 3.4–10.8)

## 2022-08-08 LAB — HEPATIC FUNCTION PANEL
ALT: 23 IU/L (ref 0–32)
AST: 20 IU/L (ref 0–40)
Albumin: 4.7 g/dL (ref 3.9–4.9)
Alkaline Phosphatase: 87 IU/L (ref 44–121)
Bilirubin Total: 0.5 mg/dL (ref 0.0–1.2)
Bilirubin, Direct: 0.16 mg/dL (ref 0.00–0.40)
Total Protein: 7.4 g/dL (ref 6.0–8.5)

## 2022-08-20 ENCOUNTER — Telehealth: Payer: Self-pay | Admitting: *Deleted

## 2022-10-10 ENCOUNTER — Other Ambulatory Visit: Payer: Self-pay | Admitting: Internal Medicine

## 2022-10-10 ENCOUNTER — Telehealth: Payer: Self-pay | Admitting: Neurology

## 2022-10-10 MED ORDER — MAYZENT 2 MG PO TABS: 2.0000 mg | ORAL_TABLET | Freq: Every day | ORAL | 3 refills | Status: DC

## 2022-10-10 NOTE — Telephone Encounter (Signed)
Pt states she was told by the [employer] Assistance [employer] that her provider needs to contacted because she has no refills on her Siponimod Fumarate (MAYZENT) 2 MG TABS.  Pt provided the e-scribe location as [facility] [place] their [phone]

## 2022-10-10 NOTE — Telephone Encounter (Signed)
A refill was entered and sent to the pharmacy as requested by the pt

## 2022-10-14 ENCOUNTER — Encounter: Payer: Self-pay | Admitting: Neurology

## 2022-10-14 MED ORDER — MAYZENT 2 MG PO TABS: 2.0000 mg | ORAL_TABLET | Freq: Every day | ORAL | 11 refills | Status: DC

## 2022-10-14 NOTE — Telephone Encounter (Signed)
Called number provided by pt and spoke w/ Shameka. Transferred me to [place].  She then transferred me to pharmacist, Tiffany.  Provided VO for Mayzent '2mg'$ . She will get prescription ready for pt, nothing further needed.

## 2023-01-07 ENCOUNTER — Encounter: Payer: Self-pay | Admitting: Neurology

## 2023-01-08 NOTE — Telephone Encounter (Signed)
Faxed completed/signed prescriber portion of application to [employer] at [phone]. Received fax confirmation.

## 2023-01-14 ENCOUNTER — Telehealth: Payer: Self-pay | Admitting: Neurology

## 2023-01-14 NOTE — Telephone Encounter (Signed)
LVM and sent mychart msg informing pt of appointment change due to MD being out

## 2023-01-22 NOTE — Telephone Encounter (Signed)
Jean Woods from [employer] patient assistance [phone]) left a vm on my phone needing a PA for Mayzent call [facility] at [phone] then fax results to her at [phone]. she said the PA on file expired in November.

## 2023-01-23 ENCOUNTER — Telehealth: Payer: Self-pay | Admitting: *Deleted

## 2023-01-23 NOTE — Telephone Encounter (Signed)
Sent PA request to prior authorization team to complete

## 2023-01-23 NOTE — Telephone Encounter (Signed)
Received following message: "Shalene from [employer] patient assistance [phone]) left a vm on my phone needing a PA for Mayzent call [facility] at [phone] then fax results to her at [phone]. she said the PA on file expired in November. "

## 2023-01-24 ENCOUNTER — Other Ambulatory Visit (HOSPITAL_COMMUNITY): Payer: Self-pay

## 2023-02-06 ENCOUNTER — Encounter: Payer: Self-pay | Admitting: Neurology

## 2023-02-06 ENCOUNTER — Ambulatory Visit (INDEPENDENT_AMBULATORY_CARE_PROVIDER_SITE_OTHER): Payer: BC Managed Care – PPO | Admitting: Neurology

## 2023-02-06 VITALS — BP 148/104 | HR 101 | Ht 64.0 in | Wt 265.0 lb

## 2023-02-06 DIAGNOSIS — E559 Vitamin D deficiency, unspecified: Secondary | ICD-10-CM

## 2023-02-06 DIAGNOSIS — G35 Multiple sclerosis: Secondary | ICD-10-CM | POA: Diagnosis not present

## 2023-02-06 DIAGNOSIS — R202 Paresthesia of skin: Secondary | ICD-10-CM | POA: Diagnosis not present

## 2023-02-06 DIAGNOSIS — Z79899 Other long term (current) drug therapy: Secondary | ICD-10-CM

## 2023-02-06 DIAGNOSIS — G43109 Migraine with aura, not intractable, without status migrainosus: Secondary | ICD-10-CM | POA: Diagnosis not present

## 2023-02-06 MED ORDER — ALPRAZOLAM 1 MG PO TABS: ORAL_TABLET | ORAL | 0 refills | Status: DC

## 2023-02-06 NOTE — Progress Notes (Signed)
[facility]  PATIENT: Jean Woods DOB: 1986/09/30  REFERRING DOCTOR OR PCP:  Varney Baas, MD; Pricilla Holm (PCP) SOURCE: Patient, notes from recent hospitalization, imaging and laboratory reports, multiple MRI images personally reviewed  _________________________________   HISTORICAL  CHIEF COMPLAINT:  Chief Complaint  Patient presents with   Room 10    Pt is here is with her Husband. Pt states that things have been going okay. Pt states that she had 2 headaches one in December and one in November. Pt states that she had muscle weakness and numbness in her hands. Pt states no muscle weakness in her legs.     HISTORY OF PRESENT ILLNESS:  Jean Woods is a 37 y.o. woman with multiple sclerosis.  Update 08/07/2022:: She is on Mayzent and feel her MS is mostly stable.   She had a couple events with reduced speech x a few seconds, photophobia, nausea but no pain.  She was better after sleeping   She also noted more hand weakness bilateral with some numbness x 3 days but is now back to baseline.   She has had migraines and complicated migraines in past.    She does report twoepisodes of numbness on the right side of the face radiating down the arms present for 15 to 40 minutes.  First episode was April 2023 and last episode was 2 days ago.  Cognitively she was foggy and reading comprehension was off and she noted visual blurring more to the right for few minutes after the numbness started.   She had a headache (mild) and N/V for the next 10-12 hours.   She got nauseous if she tried to lay down.   She felt better this am.  She has not had any of these episodes before this year  Gait is doing well and she has no trouble doing a couple miles.   She does not need to hold the bannister on stairs.   No fixed numbness or tingling.  Dysesthesias she had at onset improved.    Vision is doing better (she just had an eye appt and has a new prescription).  Bladder  function is normal..       She has some fatigue and is trying to get more active.   She is trying to exercise more.    She sleeps well most nights (6-8 hours; sometimes 9-10 on weekends).   She notes mild short term memory issues and sometimes se has trouble coming up with the right words.   She feels she does fine at work.     She feels smood has done well  She is taking 5000 U daily Vit D (was 26.5 when checked 2022)  MS History: She presented to the emergency room 09/24/2021 with paresthesias and was admitted after imaging studies showed findings consistent with multiple sclerosis.   She had numbness from her waist on down to the legs.    She felt gait was slightly affected. Strength was fine.   She was admittted and had 5 days of IV Solu-Medrol.   Numbness is slowly improving though her feet and perineum are still numb.      As the numbness resolved, she felt more sore and also felt mildly weak.      In retrospect, she had some epsidoes of numbness in her legs that lasted a few days to 2 weeks and these episodes were not as severe as the one early October.  The first episode was in 2018  and she had 3-4 other episodes over the past 3 years.     She had blurry vision with a gray spot in the middle of her OS vision, much better the next day but some visual change for 2-3 days.    She started [facility] December 2022.     Data: While in the hospital 09/25/21-09/28/2021:  EKG rhythm strip showed normal sinus rhythm with a PR interval of 13 ms QTc = 0.34 ms.  CBC with differential, chemistries and liver function tests were normal.anti-AQP4 antibody was negative.  Vitamin D 06/28/2021 was low at 13.  She started taking 1000 units daily.  Imaging review: MRI of the brain 09/25/2021 showed multiple T2/FLAIR hyperintense foci in the periventricular, juxtacortical and deep white matter.  None of the foci enhanced after contrast.  The focus adjacent to C2-C3 is also seen.  MRI of the thoracic spine 09/25/2021 showed  T2 hyperintense foci within the spinal cord adjacent to T5 (central), T7 (central), T8 (central slightly posterior into the left), T10-T11 (2 foci 1 central and 1 to the left) and T11-T12 (anterior slightly to the right).  The focus at T5 enhanced after contrast.  MRI of the cervical spine 09/25/2021 showed a focus adjacent to C3-C4 (anterocentral) and a possible focus adjacent to C7-T1 (posterior)  MRI of the lumbar spine 09/25/2021 showed a focus at T11-T12 and a subtle focus to the left at T12.  Additionally at L4-L5 there is spinal stenosis due to disc protrusion and increased epidural fat.  There is no significant foraminal narrowing.  There is mild lateral recess stenosis but no nerve root compression.    REVIEW OF SYSTEMS: Constitutional: No fevers, chills, sweats, or change in appetite Eyes: No visual changes, double vision, eye pain Ear, nose and throat: No hearing loss, ear pain, nasal congestion, sore throat Cardiovascular: No chest pain, palpitations Respiratory:  No shortness of breath at rest or with exertion.   No wheezes GastrointestinaI: No nausea, vomiting, diarrhea, abdominal pain, fecal incontinence Genitourinary:  No dysuria, urinary retention or frequency.  No nocturia. Musculoskeletal:  No neck pain, back pain Integumentary: No rash, pruritus, skin lesions Neurological: as above Psychiatric: No depression at this time.  No anxiety Endocrine: No palpitations, diaphoresis, change in appetite, change in weigh or increased thirst Hematologic/Lymphatic:  No anemia, purpura, petechiae. Allergic/Immunologic: No itchy/runny eyes, nasal congestion, recent allergic reactions, rashes  ALLERGIES: Allergies  Allergen Reactions   Wound Dressing Adhesive Hives    Develops rash and burning sensation where adhesive tape it placed.     HOME MEDICATIONS:  Current Outpatient Medications:    acetaminophen (TYLENOL) 500 MG tablet, Take 1,000 mg by mouth every 6 (six) hours as needed  for moderate pain or headache., Disp: , Rfl:    albuterol (PROVENTIL HFA;VENTOLIN HFA) 108 (90 Base) MCG/ACT inhaler, Inhale 1 puff into the lungs every 6 (six) hours as needed for wheezing or shortness of breath., Disp: , Rfl:    beclomethasone (QVAR) 80 MCG/ACT inhaler, Inhale 1 puff into the lungs daily., Disp: , Rfl:    cholecalciferol (VITAMIN D) 25 MCG (1000 UNIT) tablet, Take 5,000 Units by mouth at bedtime., Disp: , Rfl:    famotidine (PEPCID) 20 MG tablet, Take 1 tablet (20 mg total) by mouth 2 (two) times daily. (Patient taking differently: Take 20 mg by mouth every evening.), Disp: 30 tablet, Rfl: 0   levocetirizine (XYZAL) 5 MG tablet, Take 5 mg by mouth every evening., Disp: , Rfl:    OLOPATADINE HCL  NA, Place 2 sprays into both nostrils See admin instructions. Once or twice daily, Disp: , Rfl:    Siponimod Fumarate (MAYZENT) 2 MG TABS, Take 2 mg by mouth daily., Disp: 30 tablet, Rfl: 11  PAST MEDICAL HISTORY: Past Medical History:  Diagnosis Date   Asthma    Chicken pox    Depression    Seasonal allergies     PAST SURGICAL HISTORY: History reviewed. No pertinent surgical history.  FAMILY HISTORY: Family History  Problem Relation Age of Onset   Breast cancer Mother    Thyroid disease Mother    Stroke Maternal Grandmother    Thyroid disease Maternal Grandmother    Colon cancer Maternal Grandfather    Neurologic Disorder Neg Hx    Multiple sclerosis Neg Hx     SOCIAL HISTORY:  Social History   Socioeconomic History   Marital status: Married    Spouse name: Not on file   Number of children: Not on file   Years of education: Not on file   Highest education level: Not on file  Occupational History   Occupation: HR  Tobacco Use   Smoking status: Never   Smokeless tobacco: Never  Vaping Use   Vaping Use: Never used  Substance and Sexual Activity   Alcohol use: Yes    Alcohol/week: 5.0 standard drinks of alcohol    Types: 5 Cans of beer per week    Comment:  weekends   Drug use: Never   Sexual activity: Not on file  Other Topics Concern   Not on file  Social History Narrative   Not on file   Social Determinants of Health   Financial Resource Strain: Not on file  Food Insecurity: Not on file  Transportation Needs: Not on file  Physical Activity: Not on file  Stress: Not on file  Social Connections: Not on file  Intimate Partner Violence: Not on file     PHYSICAL EXAM  Vitals:   02/06/23 0929  BP: (!) 148/104  Pulse: (!) 101  Weight: 265 lb (120.2 kg)  Height: 5' 4"$  (1.626 m)    Body mass index is 45.49 kg/m.   General: The patient is well-developed and well-nourished and in no acute distress  HEENT:  Head is [place]/AT.  Sclera are anicteric.    Skin: Extremities are without rash or  edema.  Neurologic Exam  Mental status: The patient is alert and oriented x 3 at the time of the examination. The patient has apparent normal recent and remote memory, with an apparently normal attention span and concentration ability.   Speech is normal.  Cranial nerves: Extraocular movements are full. Pupils are equal, round, and reactive to light and accomodation.  Color vision is now symmetric facial symmetry is present.  Facial strength and sensation was normal.  No obvious hearing deficits are noted.  Motor:  Muscle bulk is normal.   Tone is normal. Strength is  5 / 5 in all 4 extremities.   Sensory: Sensory testing is intact to pinprick, soft touch and vibration sensation in all 4 extremities.  Coordination: Cerebellar testing reveals good finger-nose-finger and heel-to-shin bilaterally.  Gait and station: Station is normal.   Gait is fairly normal.  Tandem gait is very mildly wide for age..  Romberg is negative.   Reflexes: Deep tendon reflexes are symmetric and normal bilaterally.   Marland Kitchen    DIAGNOSTIC DATA (LABS, IMAGING, TESTING) - I reviewed patient records, labs, notes, testing and imaging myself where available.  Lab  Results   Component Value Date   WBC 7.7 08/07/2022   HGB 14.2 08/07/2022   HCT 42.6 08/07/2022   MCV 88 08/07/2022   PLT 360 08/07/2022      Component Value Date/Time   NA 139 02/07/2022 1039   K 4.7 02/07/2022 1039   CL 103 02/07/2022 1039   CO2 25 02/07/2022 1039   GLUCOSE 82 02/07/2022 1039   GLUCOSE 101 (H) 09/29/2021 0311   BUN 11 02/07/2022 1039   CREATININE 0.77 02/07/2022 1039   CALCIUM 9.3 02/07/2022 1039   PROT 7.4 08/07/2022 1200   ALBUMIN 4.7 08/07/2022 1200   AST 20 08/07/2022 1200   ALT 23 08/07/2022 1200   ALKPHOS 87 08/07/2022 1200   BILITOT 0.5 08/07/2022 1200   GFRNONAA >60 09/29/2021 0311   GFRAA >60 12/27/2018 1615   Lab Results  Component Value Date   CHOL 257 (H) 06/28/2021   HDL 72.30 06/28/2021   LDLCALC 170 (H) 06/28/2021   LDLDIRECT 164.5 12/11/2012   TRIG 73.0 06/28/2021   CHOLHDL 4 06/28/2021   Lab Results  Component Value Date   HGBA1C 5.0 09/27/2021   Lab Results  Component Value Date   [id number] 06/28/2021   Lab Results  Component Value Date   TSH 2.12 06/28/2021       ASSESSMENT AND PLAN  Multiple sclerosis ([place]) - Plan: MR BRAIN W WO CONTRAST, MR CERVICAL SPINE W WO CONTRAST, CBC with Differential/Platelet, Hepatic function panel  High risk medication use  Complicated migraine  Paresthesia of both legs  Vitamin D deficiency   Continue Mayzent .  Check labs today.  C heck an MRI of the brain and cerv spine to determine if there is breakthrough activity and consider a different DMT if this is occurring. We discussed her 2 episodes.  These most likely represent complicated migraine.  If another episode occurs we may need to consider prophylactic therapy with a calcium channel blocker.  I would also consider symptomatic therapy with a triptan or anti-CGRP agent.   Stay active and exericse as tolerated Ok to go on international trip.  Try to walk on plane to reduce stiffness.  Rtc 6 months.  Call sooner if new or worsening  neurologic symptoms.   Krystle Oberman A. Felecia Shelling, MD, [facility] [id number], [id number] AM Certified in Neurology, Clinical Neurophysiology, Sleep Medicine and Neuroimaging  [facility] [address], [place], [place] [address] [phone]

## 2023-02-06 NOTE — Telephone Encounter (Signed)
Called [employer] at [phone] (chose pharmacy option) and spoke with Gershon Mussel. Pt ID: [id number]. States he is getting paid claim for Mayzent 75m. States there is a PA approval on file.  [facility] [place] [facility] because she could not fill at this location, has [facility].   He transferred me to pharmacy help desk/[facility] ( Phone# [phone] Spoke w/ LPurcell Nails States PA expired on 11/19/2022. Confirmed they need to new PA completed. Their phone# [phone] He transferred me. Spoke w/ MLilia Pro(customer care rep).  Submitted PA Mayzent 220mtablet #30/30 over the phone. Case ID: [id number] marked case urgent. They will fax determination to [phone]  Total time of call: [id number]

## 2023-02-06 NOTE — Telephone Encounter (Signed)
Faxed approval letter to [employer] at [phone]. Received fax confirmation.

## 2023-02-07 LAB — CBC WITH DIFFERENTIAL/PLATELET
Basophils Absolute: 0.1 10*3/uL (ref 0.0–0.2)
Basos: 1 %
EOS (ABSOLUTE): 0.1 10*3/uL (ref 0.0–0.4)
Eos: 2 %
Hematocrit: 42.8 % (ref 34.0–46.6)
Hemoglobin: 13.8 g/dL (ref 11.1–15.9)
Immature Grans (Abs): 0 10*3/uL (ref 0.0–0.1)
Immature Granulocytes: 0 %
Lymphocytes Absolute: 0.4 10*3/uL — ABNORMAL LOW (ref 0.7–3.1)
Lymphs: 7 %
MCH: 28.8 pg (ref 26.6–33.0)
MCHC: 32.2 g/dL (ref 31.5–35.7)
MCV: 89 fL (ref 79–97)
Monocytes Absolute: 0.7 10*3/uL (ref 0.1–0.9)
Monocytes: 12 %
Neutrophils Absolute: 4.8 10*3/uL (ref 1.4–7.0)
Neutrophils: 78 %
Platelets: 359 10*3/uL (ref 150–450)
RBC: 4.79 x10E6/uL (ref 3.77–5.28)
RDW: 13.7 % (ref 11.7–15.4)
WBC: 6.1 10*3/uL (ref 3.4–10.8)

## 2023-02-07 LAB — HEPATIC FUNCTION PANEL
ALT: 59 IU/L — ABNORMAL HIGH (ref 0–32)
AST: 32 IU/L (ref 0–40)
Albumin: 4.4 g/dL (ref 3.9–4.9)
Alkaline Phosphatase: 106 IU/L (ref 44–121)
Bilirubin Total: 0.5 mg/dL (ref 0.0–1.2)
Bilirubin, Direct: 0.16 mg/dL (ref 0.00–0.40)
Total Protein: 6.8 g/dL (ref 6.0–8.5)

## 2023-02-11 ENCOUNTER — Other Ambulatory Visit (HOSPITAL_COMMUNITY): Payer: Self-pay

## 2023-02-11 NOTE — Telephone Encounter (Addendum)
I called [facility] to start pa process for Mayzent-PA has been approved-starting 02/06/2023-02/06/2024 qty 30 tabs for a DS of 30. Authorization number is [id number] tried to outreach the [employer] Patient Assistance [facility]) to let them know PA has been approved However the phone number listed below in previous message is not the correct phone number-it was to a credit card line for [employer] so the rep has not been updated on the PA.

## 2023-02-11 NOTE — Telephone Encounter (Signed)
I completed PA on 02/06/23 and got approval. [employer] has been notified of approval.

## 2023-02-13 ENCOUNTER — Ambulatory Visit: Payer: BC Managed Care – PPO | Admitting: Neurology

## 2023-02-13 ENCOUNTER — Telehealth: Payer: Self-pay | Admitting: Psychiatry

## 2023-02-13 NOTE — Telephone Encounter (Signed)
Dillon Bjork: [id number] exp. 02/13/23-03/14/23 sent to GI [id number]

## 2023-03-14 DIAGNOSIS — L501 Idiopathic urticaria: Secondary | ICD-10-CM | POA: Diagnosis not present

## 2023-03-14 DIAGNOSIS — J3089 Other allergic rhinitis: Secondary | ICD-10-CM | POA: Diagnosis not present

## 2023-03-14 DIAGNOSIS — J4530 Mild persistent asthma, uncomplicated: Secondary | ICD-10-CM | POA: Diagnosis not present

## 2023-03-17 ENCOUNTER — Telehealth: Payer: Self-pay | Admitting: Internal Medicine

## 2023-03-17 NOTE — Telephone Encounter (Signed)
PT has been corresponding with me via MyChart regarding getting a TOC done from [place] to [place]. I had informed her that we would need an approval from both providers before going forward. I also let her know that Dr.John was not technically taking in new patients at the moment. I will inform PT of the provider's decision.

## 2023-03-17 NOTE — Telephone Encounter (Signed)
Fine with me

## 2023-03-17 NOTE — Telephone Encounter (Signed)
Ok with me 

## 2023-03-31 ENCOUNTER — Ambulatory Visit (INDEPENDENT_AMBULATORY_CARE_PROVIDER_SITE_OTHER): Payer: BC Managed Care – PPO | Admitting: Internal Medicine

## 2023-03-31 ENCOUNTER — Encounter: Payer: Self-pay | Admitting: Internal Medicine

## 2023-03-31 VITALS — BP 180/108 | HR 110 | Temp 98.5°F | Ht 64.0 in | Wt 267.0 lb

## 2023-03-31 DIAGNOSIS — Z1159 Encounter for screening for other viral diseases: Secondary | ICD-10-CM

## 2023-03-31 DIAGNOSIS — Z0001 Encounter for general adult medical examination with abnormal findings: Secondary | ICD-10-CM | POA: Diagnosis not present

## 2023-03-31 DIAGNOSIS — E7800 Pure hypercholesterolemia, unspecified: Secondary | ICD-10-CM

## 2023-03-31 DIAGNOSIS — I10 Essential (primary) hypertension: Secondary | ICD-10-CM | POA: Diagnosis not present

## 2023-03-31 DIAGNOSIS — E538 Deficiency of other specified B group vitamins: Secondary | ICD-10-CM | POA: Diagnosis not present

## 2023-03-31 DIAGNOSIS — E559 Vitamin D deficiency, unspecified: Secondary | ICD-10-CM

## 2023-03-31 DIAGNOSIS — F419 Anxiety disorder, unspecified: Secondary | ICD-10-CM

## 2023-03-31 DIAGNOSIS — G35 Multiple sclerosis: Secondary | ICD-10-CM

## 2023-03-31 DIAGNOSIS — R739 Hyperglycemia, unspecified: Secondary | ICD-10-CM

## 2023-03-31 LAB — LIPID PANEL
Cholesterol: 277 mg/dL — ABNORMAL HIGH (ref 0–200)
HDL: 69.2 mg/dL (ref >39.00)
LDL Cholesterol: 184 mg/dL — ABNORMAL HIGH (ref 0–99)
NonHDL: 207.42
Total CHOL/HDL Ratio: 4
Triglycerides: 119 mg/dL (ref 0.0–149.0)
VLDL: 23.8 mg/dL (ref 0.0–40.0)

## 2023-03-31 LAB — URINALYSIS, ROUTINE W REFLEX MICROSCOPIC
Bilirubin Urine: NEGATIVE
Hgb urine dipstick: NEGATIVE
Ketones, ur: NEGATIVE
Leukocytes,Ua: NEGATIVE
Nitrite: NEGATIVE
RBC / HPF: NONE SEEN (ref 0–?)
Specific Gravity, Urine: <=1.005 — AB (ref 1.000–1.030)
Total Protein, Urine: NEGATIVE
Urine Glucose: NEGATIVE
Urobilinogen, UA: 0.2 (ref 0.0–1.0)
pH: 6.5 (ref 5.0–8.0)

## 2023-03-31 LAB — CBC WITH DIFFERENTIAL/PLATELET
Basophils Absolute: 0 10*3/uL (ref 0.0–0.1)
Basophils Relative: 0.7 % (ref 0.0–3.0)
Eosinophils Absolute: 0.1 10*3/uL (ref 0.0–0.7)
Eosinophils Relative: 1.5 % (ref 0.0–5.0)
HCT: 42.4 % (ref 36.0–46.0)
Hemoglobin: 14 g/dL (ref 12.0–15.0)
Lymphocytes Relative: 5.5 % — ABNORMAL LOW (ref 12.0–46.0)
Lymphs Abs: 0.4 10*3/uL — ABNORMAL LOW (ref 0.7–4.0)
MCHC: 33.1 g/dL (ref 30.0–36.0)
MCV: 88.8 fl (ref 78.0–100.0)
Monocytes Absolute: 0.5 10*3/uL (ref 0.1–1.0)
Monocytes Relative: 8.1 % (ref 3.0–12.0)
Neutro Abs: 5.6 10*3/uL (ref 1.4–7.7)
Neutrophils Relative %: 84.2 % — ABNORMAL HIGH (ref 43.0–77.0)
Platelets: 364 10*3/uL (ref 150.0–400.0)
RBC: 4.77 Mil/uL (ref 3.87–5.11)
RDW: 14.7 % (ref 11.5–15.5)
WBC: 6.6 10*3/uL (ref 4.0–10.5)

## 2023-03-31 LAB — BASIC METABOLIC PANEL
BUN: 12 mg/dL (ref 6–23)
CO2: 27 mEq/L (ref 19–32)
Calcium: 9.7 mg/dL (ref 8.4–10.5)
Chloride: 104 mEq/L (ref 96–112)
Creatinine, Ser: 0.77 mg/dL (ref 0.40–1.20)
GFR: 97.36 mL/min (ref >60.00)
Glucose, Bld: 102 mg/dL — ABNORMAL HIGH (ref 70–99)
Potassium: 4 mEq/L (ref 3.5–5.1)
Sodium: 139 mEq/L (ref 135–145)

## 2023-03-31 LAB — HEPATIC FUNCTION PANEL
ALT: 35 U/L (ref 0–35)
AST: 22 U/L (ref 0–37)
Albumin: 4.6 g/dL (ref 3.5–5.2)
Alkaline Phosphatase: 90 U/L (ref 39–117)
Bilirubin, Direct: 0.1 mg/dL (ref 0.0–0.3)
Total Bilirubin: 0.4 mg/dL (ref 0.2–1.2)
Total Protein: 7.4 g/dL (ref 6.0–8.3)

## 2023-03-31 LAB — VITAMIN B12: Vitamin B-12: 493 pg/mL (ref 211–911)

## 2023-03-31 LAB — TSH: TSH: 2.97 u[IU]/mL (ref 0.35–5.50)

## 2023-03-31 LAB — HEMOGLOBIN A1C: Hgb A1c MFr Bld: 5.1 % (ref 4.6–6.5)

## 2023-03-31 LAB — VITAMIN D 25 HYDROXY (VIT D DEFICIENCY, FRACTURES): VITD: 60.56 ng/mL (ref 30.00–100.00)

## 2023-03-31 MED ORDER — AMLODIPINE BESYLATE 5 MG PO TABS: 5.0000 mg | ORAL_TABLET | Freq: Every day | ORAL | 3 refills | Status: DC

## 2023-03-31 NOTE — Assessment & Plan Note (Signed)
Stable overall, declines need for change in tx 

## 2023-03-31 NOTE — Patient Instructions (Signed)
Please take all new medication as prescribed - the amlodipine 5 mg per day  Please continue all other medications as before, and refills have been done if requested.  Please have the pharmacy call with any other refills you may need.  Please continue your efforts at being more active, low cholesterol diet, and weight control.  You are otherwise up to date with prevention measures today.  Please keep your appointments with your specialists as you may have planned  You will be contacted regarding the referral for: Nutrition  Please go to the LAB at the blood drawing area for the tests to be done  You will be contacted by phone if any changes need to be made immediately.  Otherwise, you will receive a letter about your results with an explanation, but please check with MyChart first.  Please remember to sign up for MyChart if you have not done so, as this will be important to you in the future with finding out test results, communicating by private email, and scheduling acute appointments online when needed.  Please make an Appointment to return in 6 months, or sooner if needed

## 2023-03-31 NOTE — Assessment & Plan Note (Signed)
BP Readings from Last 3 Encounters:  03/31/23 (!) 180/108  02/06/23 (!) 148/104  08/07/22 (!) 146/92   Uncontrolled,  pt to start amlodipine 5 mg qd

## 2023-03-31 NOTE — Assessment & Plan Note (Signed)
Age and sex appropriate education and counseling updated with regular exercise and diet Referrals for preventative services - for hep c screen, pt to call for Gyn pap and mammogram Immunizations addressed - none needed Smoking counseling  - none needed Evidence for depression or other mood disorder - none significant Most recent labs reviewed. I have personally reviewed and have noted: 1) the patient's medical and social history 2) The patient's current medications and supplements 3) The patient's height, weight, and BMI have been recorded in the chart

## 2023-03-31 NOTE — Assessment & Plan Note (Signed)
Stable, f/u neuro as planned

## 2023-03-31 NOTE — Progress Notes (Signed)
Patient ID: Jean Woods, female   DOB: [birth year], 37 y.o.   MRN: [id number]         Chief Complaint:: wellness exam and htn, obeisty, hld, anxiety, MS       HPI:  Jean Woods is a 37 y.o. female here for wellness exam; has GYN appt next month for pap and prob mammogram.; due for hep c screen, decllines covid booster, o/w up to date                        Also Pt denies chest pain, increased sob or doe, wheezing, orthopnea, PND, increased LE swelling, palpitations, dizziness or syncope.   Pt denies polydipsia, polyuria, or new focal neuro s/s.    Pt denies fever, wt loss, night sweats, loss of appetite, or other constitutional symptoms  Asks for dietary referral.  Wt inreasing with BP it seems.  Trying to follow lower chol diet 10 lb wt gain since August.   Wt Readings from Last 3 Encounters:  03/31/23 267 lb (121.1 kg)  02/06/23 265 lb (120.2 kg)  08/07/22 258 lb (117 kg)   BP Readings from Last 3 Encounters:  03/31/23 (!) 180/108  02/06/23 (!) 148/104  08/07/22 (!) 146/92   Immunization History  Administered Date(s) Administered   Moderna Sars-Covid-2 Vaccination 03/16/2020, 04/10/2020, 10/27/2020   Tdap 12/11/2012, 12/25/2018   Health Maintenance Due  Topic Date Due   Hepatitis C Screening  Never done      Past Medical History:  Diagnosis Date   Asthma    Chicken pox    Depression    Seasonal allergies    History reviewed. No pertinent surgical history.  reports that she has never smoked. She has never used smokeless tobacco. She reports current alcohol use of about 5.0 standard drinks of alcohol per week. She reports that she does not use drugs. family history includes Breast cancer in her mother; Colon cancer in her maternal grandfather; Stroke in her maternal grandmother; Thyroid disease in her maternal grandmother and mother. Allergies  Allergen Reactions   Wound Dressing Adhesive Hives    Develops rash and burning sensation where adhesive tape it placed.     Current Outpatient Medications on File Prior to Visit  Medication Sig Dispense Refill   acetaminophen (TYLENOL) 500 MG tablet Take 1,000 mg by mouth every 6 (six) hours as needed for moderate pain or headache.     albuterol (PROVENTIL HFA;VENTOLIN HFA) 108 (90 Base) MCG/ACT inhaler Inhale 1 puff into the lungs every 6 (six) hours as needed for wheezing or shortness of breath.     ALPRAZolam (XANAX) 1 MG tablet Take 1 or 2 po prior to MRI 2 tablet 0   beclomethasone (QVAR) 80 MCG/ACT inhaler Inhale 1 puff into the lungs daily.     cholecalciferol (VITAMIN D) 25 MCG (1000 UNIT) tablet Take 5,000 Units by mouth at bedtime.     famotidine (PEPCID) 20 MG tablet Take 1 tablet (20 mg total) by mouth 2 (two) times daily. (Patient taking differently: Take 20 mg by mouth every evening.) 30 tablet 0   levocetirizine (XYZAL) 5 MG tablet Take 5 mg by mouth every evening.     OLOPATADINE HCL NA Place 2 sprays into both nostrils See admin instructions. Once or twice daily     Siponimod Fumarate (MAYZENT) 2 MG TABS Take 2 mg by mouth daily. 30 tablet 11   albuterol (PROAIR HFA) 108 (90 Base) MCG/ACT inhaler 2 puffs  as needed Inhalation every 4-6hrs prn for 90 days     QVAR REDIHALER 80 MCG/ACT inhaler Inhale into the lungs.     VITAMIN D PO 5,000 Int'l Units.     No current facility-administered medications on file prior to visit.        ROS:  All others reviewed and negative.  Objective        PE:  BP (!) 180/108   Pulse (!) 110   Temp 98.5 F (36.9 C) (Oral)   Ht  (1.626 m)   Wt 267 lb (121.1 kg)   SpO2 98%   BMI 45.83 kg/m                 Constitutional: Pt appears in NAD               HENT: Head: NCAT.                Right Ear: External ear normal.                 Left Ear: External ear normal.                Eyes: . Pupils are equal, round, and reactive to light. Conjunctivae and EOM are normal               Nose: without d/c or deformity               Neck: Neck supple. Gross  normal ROM               Cardiovascular: Normal rate and regular rhythm.                 Pulmonary/Chest: Effort normal and breath sounds without rales or wheezing.                Abd:  Soft, NT, ND, + BS, no organomegaly               Neurological: Pt is alert. At baseline orientation, motor grossly intact               Skin: Skin is warm. No rashes, no other new lesions, LE edema - none               Psychiatric: Pt behavior is normal without agitation   Micro: none  Cardiac tracings I have personally interpreted today:  none  Pertinent Radiological findings (summarize): none   Lab Results  Component Value Date   WBC 6.6 03/31/2023   HGB 14.0 03/31/2023   HCT 42.4 03/31/2023   PLT 364.0 03/31/2023   GLUCOSE 102 (H) 03/31/2023   CHOL 277 (H) 03/31/2023   TRIG 119.0 03/31/2023   HDL 69.20 03/31/2023   LDLDIRECT 164.5 12/11/2012   LDLCALC 184 (H) 03/31/2023   ALT 35 03/31/2023   AST 22 03/31/2023   NA 139 03/31/2023   K 4.0 03/31/2023   CL 104 03/31/2023   CREATININE 0.77 03/31/2023   BUN 12 03/31/2023   CO2 27 03/31/2023   TSH 2.97 03/31/2023   HGBA1C 5.1 03/31/2023   Assessment/Plan:  Jean Woods is a 37 y.o. White or Caucasian [1] female with  has a past medical history of Asthma, Chicken pox, Depression, and Seasonal allergies.  Encounter for well adult exam with abnormal findings Age and sex appropriate education and counseling updated with regular exercise and diet Referrals for preventative services - for hep c screen, pt to call for Gyn pap  and mammogram Immunizations addressed - none needed Smoking counseling  - none needed Evidence for depression or other mood disorder - none significant Most recent labs reviewed. I have personally reviewed and have noted: 1) the patient's medical and social history 2) The patient's current medications and supplements 3) The patient's height, weight, and BMI have been recorded in the chart   Multiple sclerosis  (HCC) Stable, f/u neuro as planned  HTN (hypertension) BP Readings from Last 3 Encounters:  03/31/23 (!) 180/108  02/06/23 (!) 148/104  08/07/22 (!) 146/92   Uncontrolled,  pt to start amlodipine 5 mg qd   Anxiety Stable overall, declines need for change in tx  Hypercholesterolemia Lab Results  Component Value Date   LDLCALC 184 (H) 03/31/2023   Severe uncontrolled, declines statin, for nutrition referral, lo chol diet  Followup: Return in about 6 months (around 09/30/2023).  Oliver BarreJames Nicholus Chandran, MD 03/31/2023 8:37 PM [facility]

## 2023-03-31 NOTE — Assessment & Plan Note (Signed)
Lab Results  Component Value Date   LDLCALC 184 (H) 03/31/2023   Severe uncontrolled, declines statin, for nutrition referral, lo chol diet

## 2023-04-01 LAB — HEPATITIS C ANTIBODY: Hepatitis C Ab: NONREACTIVE

## 2023-05-14 ENCOUNTER — Other Ambulatory Visit (HOSPITAL_COMMUNITY)
Admission: RE | Admit: 2023-05-14 | Discharge: 2023-05-14 | Disposition: A | Discharge status: Home / Self Care | Payer: BC Managed Care – PPO | Source: Ambulatory Visit | Attending: Family Medicine | Admitting: Family Medicine

## 2023-05-14 ENCOUNTER — Encounter: Payer: Self-pay | Admitting: Obstetrics and Gynecology

## 2023-05-14 ENCOUNTER — Other Ambulatory Visit: Payer: Self-pay

## 2023-05-14 ENCOUNTER — Ambulatory Visit (INDEPENDENT_AMBULATORY_CARE_PROVIDER_SITE_OTHER): Payer: BC Managed Care – PPO | Admitting: Obstetrics and Gynecology

## 2023-05-14 VITALS — BP 151/89 | HR 125 | Ht 64.0 in | Wt 263.5 lb

## 2023-05-14 DIAGNOSIS — Z01419 Encounter for gynecological examination (general) (routine) without abnormal findings: Secondary | ICD-10-CM | POA: Diagnosis not present

## 2023-05-14 NOTE — Progress Notes (Signed)
Subjective:     Jean Woods is a 37 y.o. female P0 with BMI 45 here for a routine exam.  Current complaints: none.  Patient reports a monthly period lasting 3 days. She is sexually active using nothing for contraception. She would welcome a pregnancy if it occurred. She denies pelvic pain or abnormal discharge. She denies urinary complaints     Obstetric History OB History  Gravida Para Term Preterm AB Living  0 0 0 0 0 0  SAB IAB Ectopic Multiple Live Births  0 0 0 0 0   Past Medical History:  Diagnosis Date   Asthma    Chicken pox    Depression    Seasonal allergies    History reviewed. No pertinent surgical history. Family History  Problem Relation Age of Onset   Breast cancer Mother    Thyroid disease Mother    Stroke Maternal Grandmother    Thyroid disease Maternal Grandmother    Colon cancer Maternal Grandfather    Neurologic Disorder Neg Hx    Multiple sclerosis Neg Hx    Social History   Tobacco Use   Smoking status: Never   Smokeless tobacco: Never  Vaping Use   Vaping Use: Never used  Substance Use Topics   Alcohol use: Yes    Alcohol/week: 5.0 standard drinks of alcohol    Types: 5 Cans of beer per week    Comment: weekends   Drug use: Never       Review of Systems Pertinent items noted in HPI and remainder of comprehensive ROS otherwise negative.    Objective:  Blood pressure (!) 165/95, pulse (!) 128, height 5\' 4"  (1.626 m), weight 263 lb 8 oz (119.5 kg).   GENERAL: Well-developed, well-nourished female in no acute distress.  HEENT: Normocephalic, atraumatic. Sclerae anicteric.  NECK: Supple. Normal thyroid.  LUNGS: Clear to auscultation bilaterally.  HEART: Regular rate and rhythm. BREASTS: Symmetric in size. No palpable masses or lymphadenopathy, skin changes, or nipple drainage. ABDOMEN: Soft, nontender, nondistended. No organomegaly. PELVIC: Normal external female genitalia. Vagina is pink and rugated.  Normal discharge. Normal  appearing cervix. Uterus is normal in size. No adnexal mass or tenderness. Chaperone present during the pelvic exam EXTREMITIES: No cyanosis, clubbing, or edema, 2+ distal pulses.     Assessment:    Healthy female exam.    Plan:    Pap smear collected Screening mammogram ordered Patient declined STI screening Patient has a PCP to follow up on elevated BP today Patient will be contacted with abnormal results

## 2023-05-16 ENCOUNTER — Ambulatory Visit: Payer: BC Managed Care – PPO | Admitting: Nurse Practitioner

## 2023-05-20 ENCOUNTER — Ambulatory Visit: Payer: BC Managed Care – PPO | Admitting: Dietician

## 2023-05-20 LAB — CYTOLOGY - PAP
Adequacy: ABSENT
Comment: NEGATIVE
Diagnosis: NEGATIVE
High risk HPV: NEGATIVE

## 2023-07-10 DIAGNOSIS — F411 Generalized anxiety disorder: Secondary | ICD-10-CM | POA: Diagnosis not present

## 2023-07-10 DIAGNOSIS — F331 Major depressive disorder, recurrent, moderate: Secondary | ICD-10-CM | POA: Diagnosis not present

## 2023-07-10 DIAGNOSIS — Z630 Problems in relationship with spouse or partner: Secondary | ICD-10-CM | POA: Diagnosis not present

## 2023-08-12 DIAGNOSIS — F411 Generalized anxiety disorder: Secondary | ICD-10-CM | POA: Diagnosis not present

## 2023-08-12 DIAGNOSIS — F331 Major depressive disorder, recurrent, moderate: Secondary | ICD-10-CM | POA: Diagnosis not present

## 2023-08-12 DIAGNOSIS — Z630 Problems in relationship with spouse or partner: Secondary | ICD-10-CM | POA: Diagnosis not present

## 2023-08-14 ENCOUNTER — Ambulatory Visit (INDEPENDENT_AMBULATORY_CARE_PROVIDER_SITE_OTHER): Payer: BC Managed Care – PPO | Admitting: Neurology

## 2023-08-14 ENCOUNTER — Encounter: Payer: Self-pay | Admitting: Neurology

## 2023-08-14 VITALS — BP 156/86 | HR 88 | Ht 65.0 in | Wt 265.5 lb

## 2023-08-14 DIAGNOSIS — F419 Anxiety disorder, unspecified: Secondary | ICD-10-CM

## 2023-08-14 DIAGNOSIS — R200 Anesthesia of skin: Secondary | ICD-10-CM

## 2023-08-14 DIAGNOSIS — G35 Multiple sclerosis: Secondary | ICD-10-CM

## 2023-08-14 DIAGNOSIS — G43109 Migraine with aura, not intractable, without status migrainosus: Secondary | ICD-10-CM

## 2023-08-14 DIAGNOSIS — R202 Paresthesia of skin: Secondary | ICD-10-CM

## 2023-08-14 DIAGNOSIS — Z79899 Other long term (current) drug therapy: Secondary | ICD-10-CM | POA: Diagnosis not present

## 2023-08-14 DIAGNOSIS — I10 Essential (primary) hypertension: Secondary | ICD-10-CM

## 2023-08-14 NOTE — Progress Notes (Signed)
[facility]  PATIENT: Jean Woods DOB: Jan 22, 1986  REFERRING DOCTOR OR PCP:  Asa Lente, MD; Hillard Danker (PCP) SOURCE: Patient, notes from recent hospitalization, imaging and laboratory reports, multiple MRI images personally reviewed  _________________________________   HISTORICAL  CHIEF COMPLAINT:  Chief Complaint  Patient presents with   Room 11    Pt is here Alone. Pt states that things haven't been going too bad. Pt states that she doesn't have any new symptoms or complaints to discuss today.     HISTORY OF PRESENT ILLNESS:  Jean Woods is a 37 y.o. woman with multiple sclerosis.  Update 08/14/2023:: She is on Mayzent and feel her MS is mostly stable.   She tolerates it well.    We discussed there may be changes in their patient assistance and she could contact us if she has trouble getting the medication  She had one episode of leg numbness that resolved after a few hours.       She has had migraines and complicated migraines (unilateral numbness).   If she immediately takes Advil, symptoms go away.       Gait is doing well and she has no trouble doing a couple miles.   She does not need to hold the bannister on stairs.   No fixed numbness or tingling but some fluctuating sensory symptoms noted.     Vision is doing better (she just had an eye appt and has a new prescription).    Bladder function is normal..       She has fatigue and is trying to get more active.   She is trying to exercise to help.    She has mild insomnia, worse some nights ad notes stress.   She notes mild short term memory issues and sometimes se has trouble coming up with the right words.   She feels she does fine at work.    She notes work and life stress and notes depression and anxiety.   She has started therapy.     She is taking 5000 U daily Vit D (was 26.5 when checked 2022)  BP was elevated at first check.     MS History: She presented to the  emergency room 09/24/2021 with paresthesias and was admitted after imaging studies showed findings consistent with multiple sclerosis.   She had numbness from her waist on down to the legs.    She felt gait was slightly affected. Strength was fine.   She was admittted and had 5 days of IV Solu-Medrol.   Numbness is slowly improving though her feet and perineum are still numb.      As the numbness resolved, she felt more sore and also felt mildly weak.      In retrospect, she had some epsidoes of numbness in her legs that lasted a few days to 2 weeks and these episodes were not as severe as the one early October.  The first episode was in 2018 and she had 3-4 other episodes over the past 3 years.     She had blurry vision with a gray spot in the middle of her OS vision, much better the next day but some visual change for 2-3 days.    She started [facility] December 2022.     Data: While in the hospital 09/25/21-09/28/2021:  EKG rhythm strip showed normal sinus rhythm with a PR interval of 13 ms QTc = 0.34 ms.  CBC with differential, chemistries and liver function tests were  normal.anti-AQP4 antibody was negative.  Vitamin D 06/28/2021 was low at 13.  She started taking 1000 units daily.  Imaging review: MRI of the brain 09/25/2021 showed multiple T2/FLAIR hyperintense foci in the periventricular, juxtacortical and deep white matter.  None of the foci enhanced after contrast.  The focus adjacent to C2-C3 is also seen.  MRI of the thoracic spine 09/25/2021 showed T2 hyperintense foci within the spinal cord adjacent to T5 (central), T7 (central), T8 (central slightly posterior into the left), T10-T11 (2 foci 1 central and 1 to the left) and T11-T12 (anterior slightly to the right).  The focus at T5 enhanced after contrast.  MRI of the cervical spine 09/25/2021 showed a focus adjacent to C3-C4 (anterocentral) and a possible focus adjacent to C7-T1 (posterior)  MRI of the lumbar spine 09/25/2021 showed a focus at  T11-T12 and a subtle focus to the left at T12.  Additionally at L4-L5 there is spinal stenosis due to disc protrusion and increased epidural fat.  There is no significant foraminal narrowing.  There is mild lateral recess stenosis but no nerve root compression.    REVIEW OF SYSTEMS: Constitutional: No fevers, chills, sweats, or change in appetite Eyes: No visual changes, double vision, eye pain Ear, nose and throat: No hearing loss, ear pain, nasal congestion, sore throat Cardiovascular: No chest pain, palpitations Respiratory:  No shortness of breath at rest or with exertion.   No wheezes GastrointestinaI: No nausea, vomiting, diarrhea, abdominal pain, fecal incontinence Genitourinary:  No dysuria, urinary retention or frequency.  No nocturia. Musculoskeletal:  No neck pain, back pain Integumentary: No rash, pruritus, skin lesions Neurological: as above Psychiatric: No depression at this time.  No anxiety Endocrine: No palpitations, diaphoresis, change in appetite, change in weigh or increased thirst Hematologic/Lymphatic:  No anemia, purpura, petechiae. Allergic/Immunologic: No itchy/runny eyes, nasal congestion, recent allergic reactions, rashes  ALLERGIES: Allergies  Allergen Reactions   Wound Dressing Adhesive Hives    Develops rash and burning sensation where adhesive tape it placed.     HOME MEDICATIONS:  Current Outpatient Medications:    acetaminophen (TYLENOL) 500 MG tablet, Take 1,000 mg by mouth every 6 (six) hours as needed for moderate pain or headache., Disp: , Rfl:    albuterol (PROAIR HFA) 108 (90 Base) MCG/ACT inhaler, 2 puffs as needed Inhalation every 4-6hrs prn for 90 days, Disp: , Rfl:    albuterol (PROVENTIL HFA;VENTOLIN HFA) 108 (90 Base) MCG/ACT inhaler, Inhale 1 puff into the lungs every 6 (six) hours as needed for wheezing or shortness of breath., Disp: , Rfl:    ALPRAZolam (XANAX) 1 MG tablet, Take 1 or 2 po prior to MRI, Disp: 2 tablet, Rfl: 0    amLODipine (NORVASC) 5 MG tablet, Take 1 tablet (5 mg total) by mouth daily., Disp: 90 tablet, Rfl: 3   beclomethasone (QVAR) 80 MCG/ACT inhaler, Inhale 1 puff into the lungs daily., Disp: , Rfl:    cholecalciferol (VITAMIN D) 25 MCG (1000 UNIT) tablet, Take 5,000 Units by mouth at bedtime., Disp: , Rfl:    famotidine (PEPCID) 20 MG tablet, Take 1 tablet (20 mg total) by mouth 2 (two) times daily. (Patient taking differently: Take 20 mg by mouth every evening.), Disp: 30 tablet, Rfl: 0   levocetirizine (XYZAL) 5 MG tablet, Take 5 mg by mouth every evening., Disp: , Rfl:    OLOPATADINE HCL NA, Place 2 sprays into both nostrils See admin instructions. Once or twice daily, Disp: , Rfl:    QVAR REDIHALER 80  MCG/ACT inhaler, Inhale into the lungs., Disp: , Rfl:    Siponimod Fumarate (MAYZENT) 2 MG TABS, Take 2 mg by mouth daily., Disp: 30 tablet, Rfl: 11   VITAMIN D PO, 5,000 Int'l Units., Disp: , Rfl:   PAST MEDICAL HISTORY: Past Medical History:  Diagnosis Date   Asthma    Chicken pox    Depression    Seasonal allergies     PAST SURGICAL HISTORY: History reviewed. No pertinent surgical history.  FAMILY HISTORY: Family History  Problem Relation Age of Onset   Breast cancer Mother    Thyroid disease Mother    Stroke Maternal Grandmother    Thyroid disease Maternal Grandmother    Colon cancer Maternal Grandfather    Neurologic Disorder Neg Hx    Multiple sclerosis Neg Hx     SOCIAL HISTORY:  Social History   Socioeconomic History   Marital status: Married    Spouse name: Not on file   Number of children: Not on file   Years of education: Not on file   Highest education level: Not on file  Occupational History   Occupation: HR  Tobacco Use   Smoking status: Never   Smokeless tobacco: Never  Vaping Use   Vaping status: Never Used  Substance and Sexual Activity   Alcohol use: Yes    Alcohol/week: 5.0 standard drinks of alcohol    Types: 5 Cans of beer per week     Comment: weekends   Drug use: Never   Sexual activity: Yes    Birth control/protection: None  Other Topics Concern   Not on file  Social History Narrative   Left Handed   1 Cup of Coffee per Day   Social Determinants of Health   Financial Resource Strain: Not on file  Food Insecurity: Not on file  Transportation Needs: Not on file  Physical Activity: Not on file  Stress: Not on file  Social Connections: Not on file  Intimate Partner Violence: Not on file     PHYSICAL EXAM  Vitals:   08/14/23 0957  BP: (!) 177/94  Pulse: (!) 108  Weight: 265 lb 8 oz (120.4 kg)  Height: 5\' 5"  (1.651 m)    Body mass index is 44.18 kg/m.   General: The patient is well-developed and well-nourished and in no acute distress  HEENT:  Head is /AT.  Sclera are anicteric.    Skin: Extremities are without rash or  edema.  Neurologic Exam  Mental status: The patient is alert and oriented x 3 at the time of the examination. The patient has apparent normal recent and remote memory, with an apparently normal attention span and concentration ability.   Speech is normal.  Cranial nerves: Extraocular movements are full.    Facial strength and sensation was normal.  No obvious hearing deficits are noted.  Motor:  Muscle bulk is normal.   Tone is normal. Strength is  5 / 5 in all 4 extremities.   Sensory: Sensory testing is intact to pinprick, soft touch and vibration sensation in all 4 extremities.  Coordination: Cerebellar testing reveals good finger-nose-finger and heel-to-shin bilaterally.  Gait and station: Station is normal.   Gait is fairly normal.  Mildly wide tandem..  Romberg is negative.   Reflexes: Deep tendon reflexes are symmetric and normal bilaterally.   Marland Kitchen    DIAGNOSTIC DATA (LABS, IMAGING, TESTING) - I reviewed patient records, labs, notes, testing and imaging myself where available.  Lab Results  Component Value Date  WBC 6.6 03/31/2023   HGB 14.0 03/31/2023   HCT  42.4 03/31/2023   MCV 88.8 03/31/2023   PLT 364.0 03/31/2023      Component Value Date/Time   NA 139 03/31/2023 1359   NA 139 02/07/2022 1039   K 4.0 03/31/2023 1359   CL 104 03/31/2023 1359   CO2 27 03/31/2023 1359   GLUCOSE 102 (H) 03/31/2023 1359   BUN 12 03/31/2023 1359   BUN 11 02/07/2022 1039   CREATININE 0.77 03/31/2023 1359   CALCIUM 9.7 03/31/2023 1359   PROT 7.4 03/31/2023 1359   PROT 6.8 02/06/2023 1009   ALBUMIN 4.6 03/31/2023 1359   ALBUMIN 4.4 02/06/2023 1009   AST 22 03/31/2023 1359   ALT 35 03/31/2023 1359   ALKPHOS 90 03/31/2023 1359   BILITOT 0.4 03/31/2023 1359   BILITOT 0.5 02/06/2023 1009   GFRNONAA >60 09/29/2021 0311   GFRAA >60 12/27/2018 1615   Lab Results  Component Value Date   CHOL 277 (H) 03/31/2023   HDL 69.20 03/31/2023   LDLCALC 184 (H) 03/31/2023   LDLDIRECT 164.5 12/11/2012   TRIG 119.0 03/31/2023   CHOLHDL 4 03/31/2023   Lab Results  Component Value Date   HGBA1C 5.1 03/31/2023   Lab Results  Component Value Date   VITAMINB12 493 03/31/2023   Lab Results  Component Value Date   TSH 2.97 03/31/2023       ASSESSMENT AND PLAN  Multiple sclerosis (HCC)  High risk medication use  Complicated migraine  Paresthesia of both legs  Right sided numbness  Essential hypertension  Anxiety   Continue Mayzent .  Check labs today.  Check an MRI of the brain and cerv spine to determine if there is breakthrough activity and consider a different DMT if this is occurring.  This was ordered but she is claustophobic and canceled.  Advised to let us know when she feels she could do Consider SSRI if mood issues  not better with therapy Stay active and exericse as tolerated Continue Vit D Rtc 6 months.  Call sooner if new or worsening neurologic symptoms.   Alandra Sando A. Epimenio Foot, MD, [facility] 08/14/2023, 10:35 AM Certified in Neurology, Clinical Neurophysiology, Sleep Medicine and Neuroimaging  [facility] [address], [address] [place], [place] [address] [phone]

## 2023-08-15 LAB — CBC WITH DIFFERENTIAL/PLATELET
Basophils Absolute: 0.1 10*3/uL (ref 0.0–0.2)
Basos: 1 %
EOS (ABSOLUTE): 0.1 10*3/uL (ref 0.0–0.4)
Eos: 2 %
Hematocrit: 42.1 % (ref 34.0–46.6)
Hemoglobin: 13.6 g/dL (ref 11.1–15.9)
Immature Grans (Abs): 0 10*3/uL (ref 0.0–0.1)
Immature Granulocytes: 1 %
Lymphocytes Absolute: 0.4 10*3/uL — ABNORMAL LOW (ref 0.7–3.1)
Lymphs: 7 %
MCH: 29.2 pg (ref 26.6–33.0)
MCHC: 32.3 g/dL (ref 31.5–35.7)
MCV: 90 fL (ref 79–97)
Monocytes Absolute: 0.6 10*3/uL (ref 0.1–0.9)
Monocytes: 10 %
Neutrophils Absolute: 5.3 10*3/uL (ref 1.4–7.0)
Neutrophils: 79 %
Platelets: 375 10*3/uL (ref 150–450)
RBC: 4.66 x10E6/uL (ref 3.77–5.28)
RDW: 14.1 % (ref 11.7–15.4)
WBC: 6.6 10*3/uL (ref 3.4–10.8)

## 2023-08-15 LAB — COMPREHENSIVE METABOLIC PANEL
ALT: 56 IU/L — ABNORMAL HIGH (ref 0–32)
AST: 39 IU/L (ref 0–40)
Albumin: 4.6 g/dL (ref 3.9–4.9)
Alkaline Phosphatase: 116 IU/L (ref 44–121)
BUN/Creatinine Ratio: 14 (ref 9–23)
BUN: 11 mg/dL (ref 6–20)
Bilirubin Total: 0.5 mg/dL (ref 0.0–1.2)
CO2: 22 mmol/L (ref 20–29)
Calcium: 9.7 mg/dL (ref 8.7–10.2)
Chloride: 102 mmol/L (ref 96–106)
Creatinine, Ser: 0.76 mg/dL (ref 0.57–1.00)
Globulin, Total: 2.4 g/dL (ref 1.5–4.5)
Glucose: 89 mg/dL (ref 70–99)
Potassium: 4.7 mmol/L (ref 3.5–5.2)
Sodium: 138 mmol/L (ref 134–144)
Total Protein: 7 g/dL (ref 6.0–8.5)
eGFR: 102 mL/min/{1.73_m2} (ref >59)

## 2023-08-19 ENCOUNTER — Encounter: Payer: Self-pay | Admitting: Internal Medicine

## 2023-08-19 MED ORDER — AMLODIPINE-OLMESARTAN 10-40 MG PO TABS: 1.0000 | ORAL_TABLET | Freq: Every day | ORAL | 3 refills | Status: DC

## 2023-08-20 ENCOUNTER — Encounter (HOSPITAL_COMMUNITY): Payer: Self-pay

## 2023-08-20 ENCOUNTER — Other Ambulatory Visit: Payer: Self-pay

## 2023-08-20 ENCOUNTER — Emergency Department (HOSPITAL_COMMUNITY)
Admission: EM | Admit: 2023-08-20 | Discharge: 2023-08-21 | Disposition: A | Discharge status: Home / Self Care | Payer: BC Managed Care – PPO | Attending: Emergency Medicine | Admitting: Emergency Medicine

## 2023-08-20 ENCOUNTER — Emergency Department (HOSPITAL_COMMUNITY): Payer: BC Managed Care – PPO

## 2023-08-20 DIAGNOSIS — R519 Headache, unspecified: Secondary | ICD-10-CM | POA: Diagnosis not present

## 2023-08-20 DIAGNOSIS — R0602 Shortness of breath: Secondary | ICD-10-CM | POA: Diagnosis not present

## 2023-08-20 DIAGNOSIS — J45909 Unspecified asthma, uncomplicated: Secondary | ICD-10-CM | POA: Diagnosis not present

## 2023-08-20 DIAGNOSIS — Z79899 Other long term (current) drug therapy: Secondary | ICD-10-CM | POA: Insufficient documentation

## 2023-08-20 DIAGNOSIS — I10 Essential (primary) hypertension: Secondary | ICD-10-CM | POA: Insufficient documentation

## 2023-08-20 DIAGNOSIS — R079 Chest pain, unspecified: Secondary | ICD-10-CM | POA: Diagnosis not present

## 2023-08-20 LAB — BASIC METABOLIC PANEL
Anion gap: 14 (ref 5–15)
BUN: 12 mg/dL (ref 6–20)
CO2: 20 mmol/L — ABNORMAL LOW (ref 22–32)
Calcium: 9.2 mg/dL (ref 8.9–10.3)
Chloride: 103 mmol/L (ref 98–111)
Creatinine, Ser: 0.83 mg/dL (ref 0.44–1.00)
GFR, Estimated: >60 mL/min (ref >60)
Glucose, Bld: 96 mg/dL (ref 70–99)
Potassium: 3.8 mmol/L (ref 3.5–5.1)
Sodium: 137 mmol/L (ref 135–145)

## 2023-08-20 LAB — CBC
HCT: 42.3 % (ref 36.0–46.0)
Hemoglobin: 14 g/dL (ref 12.0–15.0)
MCH: 29.6 pg (ref 26.0–34.0)
MCHC: 33.1 g/dL (ref 30.0–36.0)
MCV: 89.4 fL (ref 80.0–100.0)
Platelets: 377 10*3/uL (ref 150–400)
RBC: 4.73 MIL/uL (ref 3.87–5.11)
RDW: 14.6 % (ref 11.5–15.5)
WBC: 9.1 10*3/uL (ref 4.0–10.5)
nRBC: 0 % (ref 0.0–0.2)

## 2023-08-20 LAB — TROPONIN I (HIGH SENSITIVITY): Troponin I (High Sensitivity): <2 ng/L (ref <18)

## 2023-08-20 LAB — HCG, SERUM, QUALITATIVE: Preg, Serum: NEGATIVE

## 2023-08-20 NOTE — ED Triage Notes (Signed)
Pt states that her bp was elevated at her neurology appt today. Pt reports feeling dizzy and sees spots when getting up. Pt reports SHOB when walking around. Pt states that she has a headache today but has stopped caffeine. Pt has a hx of htn and takes Norvasc 5 mg daily.

## 2023-08-20 NOTE — ED Provider Notes (Signed)
EMERGENCY DEPARTMENT AT [facility] Provider Note   CSN: [id number] Arrival date & time: 08/20/23  2014     History {Add pertinent medical, surgical, social history, OB history to HPI:1} Chief Complaint  Patient presents with   Hypertension    Jean Woods is a 37 y.o. female.  The history is provided by the patient.  Hypertension This is a chronic problem. The current episode started more than 1 week ago. The problem occurs constantly. The problem has been gradually worsening. Pertinent negatives include no chest pain, no abdominal pain, no headaches and no shortness of breath. Nothing aggravates the symptoms. Nothing relieves the symptoms. Treatments tried: has not started new medication ordered by her PMD yet. The treatment provided no relief.  Patient with HTN and depression and anxiety     Past Medical History:  Diagnosis Date   Asthma    Chicken pox    Depression    Seasonal allergies      Home Medications Prior to Admission medications   Medication Sig Start Date End Date Taking? Authorizing Provider  acetaminophen (TYLENOL) 500 MG tablet Take 1,000 mg by mouth every 6 (six) hours as needed for moderate pain or headache.    [provider]  albuterol (PROAIR HFA) 108 (90 Base) MCG/ACT inhaler 2 puffs as needed Inhalation every 4-6hrs prn for 90 days    [provider]  albuterol (PROVENTIL HFA;VENTOLIN HFA) 108 (90 Base) MCG/ACT inhaler Inhale 1 puff into the lungs every 6 (six) hours as needed for wheezing or shortness of breath.    [provider]  ALPRAZolam Prudy Feeler) 1 MG tablet Take 1 or 2 po prior to MRI 02/06/23   Sater, Pearletha Furl, MD  amLODipine (NORVASC) 5 MG tablet Take 1 tablet (5 mg total) by mouth daily. 03/31/23 03/30/24  Corwin Levins, MD  amLODipine-olmesartan (AZOR) 10-40 MG tablet Take 1 tablet by mouth daily. 08/19/23   Corwin Levins, MD  beclomethasone (QVAR) 80 MCG/ACT inhaler Inhale 1 puff into the  lungs daily.    [provider]  cholecalciferol (VITAMIN D) 25 MCG (1000 UNIT) tablet Take 5,000 Units by mouth at bedtime.    [provider]  famotidine (PEPCID) 20 MG tablet Take 1 tablet (20 mg total) by mouth 2 (two) times daily. Patient taking differently: Take 20 mg by mouth every evening. 07/10/15   Eber Hong, MD  levocetirizine (XYZAL) 5 MG tablet Take 5 mg by mouth every evening. 06/19/21   [provider]  OLOPATADINE HCL NA Place 2 sprays into both nostrils See admin instructions. Once or twice daily    [provider]  QVAR REDIHALER 80 MCG/ACT inhaler Inhale into the lungs.    [provider]  Siponimod Fumarate (MAYZENT) 2 MG TABS Take 2 mg by mouth daily. 10/14/22   Sater, Pearletha Furl, MD      Allergies    Wound dressing adhesive    Review of Systems   Review of Systems  Constitutional:  Negative for diaphoresis and fever.  HENT:  Negative for facial swelling.   Eyes:  Negative for redness.  Respiratory:  Negative for shortness of breath, wheezing and stridor.   Cardiovascular:  Negative for chest pain.  Gastrointestinal:  Negative for abdominal pain and vomiting.  Genitourinary:  Negative for dysuria.  Neurological:  Negative for headaches.  All other systems reviewed and are negative.   Physical Exam Updated Vital Signs BP 132/72 Comment: manual  Pulse (!) 108  Temp 98.3 F (36.8 C) (Oral)   Resp (!) 21   LMP 08/17/2023   SpO2 98%  Physical Exam Vitals and nursing note reviewed.  Constitutional:      General: She is not in acute distress.    Appearance: Normal appearance. She is well-developed. She is not ill-appearing or diaphoretic.  HENT:     Head: Normocephalic and atraumatic.     Nose: Nose normal.  Eyes:     Pupils: Pupils are equal, round, and reactive to light.  Cardiovascular:     Rate and Rhythm: Normal rate and regular rhythm.     Pulses: Normal pulses.     Heart sounds: Normal heart sounds.   Pulmonary:     Effort: Pulmonary effort is normal. No respiratory distress.     Breath sounds: Normal breath sounds.  Abdominal:     General: Bowel sounds are normal. There is no distension.     Palpations: Abdomen is soft.     Tenderness: There is no abdominal tenderness. There is no guarding or rebound.  Genitourinary:    Vagina: No vaginal discharge.  Musculoskeletal:        General: Normal range of motion.     Cervical back: Normal range of motion and neck supple.  Skin:    General: Skin is warm and dry.     Capillary Refill: Capillary refill takes less than 2 seconds.     Findings: No erythema or rash.  Neurological:     General: No focal deficit present.     Mental Status: She is alert and oriented to person, place, and time.     Deep Tendon Reflexes: Reflexes normal.  Psychiatric:        Mood and Affect: Mood normal.        Behavior: Behavior normal.     ED Results / Procedures / Treatments   Labs (all labs ordered are listed, but only abnormal results are displayed) Labs Reviewed  BASIC METABOLIC PANEL - Abnormal; Notable for the following components:      Result Value   CO2 20 (*)    All other components within normal limits  CBC  HCG, SERUM, QUALITATIVE  TROPONIN I (HIGH SENSITIVITY)  TROPONIN I (HIGH SENSITIVITY)    EKG EKG Interpretation Date/Time:  Wednesday August 20 2023 20:28:57 EDT Ventricular Rate:  110 PR Interval:  123 QRS Duration:  89 QT Interval:  322 QTC Calculation: 436 R Axis:   114  Text Interpretation: Sinus tachycardia Right axis deviation Confirmed by Nicanor Alcon, Marquize Seib ([id number]) on 08/20/2023 11:02:56 PM  Radiology DG Chest 2 View  Result Date: 08/20/2023 CLINICAL DATA:  Chest pain. Hypertension, shortness of breath and headache EXAM: CHEST - 2 VIEW COMPARISON:  02/12/2013 FINDINGS: The heart size and mediastinal contours are within normal limits. Both lungs are clear. The visualized skeletal structures are unremarkable. IMPRESSION: No  active cardiopulmonary disease. Electronically Signed   By: Burman Nieves M.D.   On: 08/20/2023 21:09    Procedures Procedures  {Document cardiac monitor, telemetry assessment procedure when appropriate:1}  Medications Ordered in ED Medications - No data to display  ED Course/ Medical Decision Making/ A&P   {   Click here for ABCD2, HEART and other calculatorsREFRESH Note before signing :1}                              Medical Decision Making Patient with HTN that is worsening over time, her PMD  changes her BP med recently but patient has yet to start the new preparation.    Amount and/or Complexity of Data Reviewed Labs: ordered. Radiology: ordered.    Final Clinical Impression(s) / ED Diagnoses Return for intractable cough, coughing up blood, fevers > 100.4 unrelieved by medication, shortness of breath, intractable vomiting, chest pain, shortness of breath, weakness, numbness, changes in speech, facial asymmetry, abdominal pain, passing out, Inability to tolerate liquids or food, cough, altered mental status or any concerns. No signs of systemic illness or infection. The patient is nontoxic-appearing on exam and vital signs are within normal limits.  I have reviewed the triage vital signs and the nursing notes. Pertinent labs & imaging results that were available during my care of the patient were reviewed by me and considered in my medical decision making (see chart for details). After history, exam, and medical workup I feel the patient has been appropriately medically screened and is safe for discharge home. Pertinent diagnoses were discussed with the patient. Patient was given return precautions.

## 2023-08-21 NOTE — ED Provider Notes (Incomplete)
[facility] AT [facility] Provider Note   CSN: [id number] Arrival date & time: 08/20/23  2014     History {Add pertinent medical, surgical, social history, OB history to HPI:1} Chief Complaint  Patient presents with  . Hypertension    Jean Woods is a 37 y.o. female.  The history is provided by the patient.  Hypertension This is a chronic problem. The current episode started more than 1 week ago. The problem occurs constantly. The problem has been gradually worsening. Pertinent negatives include no chest pain, no abdominal pain, no headaches and no shortness of breath. Nothing aggravates the symptoms. Nothing relieves the symptoms. Treatments tried: has not started new medication ordered by her PMD yet. The treatment provided no relief.  Patient with HTN and depression and anxiety      Past Medical History:  Diagnosis Date  . Asthma   . Chicken pox   . Depression   . Seasonal allergies      Home Medications Prior to Admission medications   Medication Sig Start Date End Date Taking? Authorizing Provider  acetaminophen (TYLENOL) 500 MG tablet Take 1,000 mg by mouth every 6 (six) hours as needed for moderate pain or headache.    [provider]  albuterol (PROAIR HFA) 108 (90 Base) MCG/ACT inhaler 2 puffs as needed Inhalation every 4-6hrs prn for 90 days    [provider]  albuterol (PROVENTIL HFA;VENTOLIN HFA) 108 (90 Base) MCG/ACT inhaler Inhale 1 puff into the lungs every 6 (six) hours as needed for wheezing or shortness of breath.    [provider]  ALPRAZolam Prudy Feeler) 1 MG tablet Take 1 or 2 po prior to MRI 02/06/23   Sater, Pearletha Furl, MD  amLODipine (NORVASC) 5 MG tablet Take 1 tablet (5 mg total) by mouth daily. 03/31/23 03/30/24  Corwin Levins, MD  amLODipine-olmesartan (AZOR) 10-40 MG tablet Take 1 tablet by mouth daily. 08/19/23   Corwin Levins, MD  beclomethasone (QVAR) 80 MCG/ACT inhaler Inhale 1 puff into  the lungs daily.    [provider]  cholecalciferol (VITAMIN D) 25 MCG (1000 UNIT) tablet Take 5,000 Units by mouth at bedtime.    [provider]  famotidine (PEPCID) 20 MG tablet Take 1 tablet (20 mg total) by mouth 2 (two) times daily. Patient taking differently: Take 20 mg by mouth every evening. 07/10/15   Eber Hong, MD  levocetirizine (XYZAL) 5 MG tablet Take 5 mg by mouth every evening. 06/19/21   [provider]  OLOPATADINE HCL NA Place 2 sprays into both nostrils See admin instructions. Once or twice daily    [provider]  QVAR REDIHALER 80 MCG/ACT inhaler Inhale into the lungs.    [provider]  Siponimod Fumarate (MAYZENT) 2 MG TABS Take 2 mg by mouth daily. 10/14/22   Sater, Pearletha Furl, MD      Allergies    Wound dressing adhesive    Review of Systems   Review of Systems  Constitutional:  Negative for diaphoresis and fever.  HENT:  Negative for facial swelling.   Eyes:  Negative for redness.  Respiratory:  Negative for shortness of breath, wheezing and stridor.   Cardiovascular:  Negative for chest pain.  Gastrointestinal:  Negative for abdominal pain and vomiting.  Genitourinary:  Negative for dysuria.  Neurological:  Negative for headaches.  All other systems reviewed and are negative.   Physical Exam Updated Vital Signs BP 132/72 Comment: manual  Pulse Marland Kitchen)  108   Temp 98.3 F (36.8 C) (Oral)   Resp (!) 21   LMP 08/17/2023   SpO2 98%  Physical Exam Vitals and nursing note reviewed.  Constitutional:      General: She is not in acute distress.    Appearance: Normal appearance. She is well-developed. She is not ill-appearing or diaphoretic.  HENT:     Head: Normocephalic and atraumatic.     Nose: Nose normal.  Eyes:     Pupils: Pupils are equal, round, and reactive to light.  Cardiovascular:     Rate and Rhythm: Normal rate and regular rhythm.     Pulses: Normal pulses.     Heart sounds: Normal heart  sounds.  Pulmonary:     Effort: Pulmonary effort is normal. No respiratory distress.     Breath sounds: Normal breath sounds.  Abdominal:     General: Bowel sounds are normal. There is no distension.     Palpations: Abdomen is soft.     Tenderness: There is no abdominal tenderness. There is no guarding or rebound.  Genitourinary:    Vagina: No vaginal discharge.  Musculoskeletal:        General: Normal range of motion.     Cervical back: Normal range of motion and neck supple.  Skin:    General: Skin is warm and dry.     Capillary Refill: Capillary refill takes less than 2 seconds.     Findings: No erythema or rash.  Neurological:     General: No focal deficit present.     Mental Status: She is alert and oriented to person, place, and time.     Deep Tendon Reflexes: Reflexes normal.  Psychiatric:        Mood and Affect: Mood normal.        Behavior: Behavior normal.     ED Results / Procedures / Treatments   Labs (all labs ordered are listed, but only abnormal results are displayed) Labs Reviewed  BASIC METABOLIC PANEL - Abnormal; Notable for the following components:      Result Value   CO2 20 (*)    All other components within normal limits  CBC  HCG, SERUM, QUALITATIVE  TROPONIN I (HIGH SENSITIVITY)  TROPONIN I (HIGH SENSITIVITY)    EKG EKG Interpretation Date/Time:  Wednesday August 20 2023 20:28:57 EDT Ventricular Rate:  110 PR Interval:  123 QRS Duration:  89 QT Interval:  322 QTC Calculation: 436 R Axis:   114  Text Interpretation: Sinus tachycardia Right axis deviation Confirmed by Nicanor Alcon, Mahogany Torrance ([id number]) on 08/20/2023 11:02:56 PM  Radiology DG Chest 2 View  Result Date: 08/20/2023 CLINICAL DATA:  Chest pain. Hypertension, shortness of breath and headache EXAM: CHEST - 2 VIEW COMPARISON:  02/12/2013 FINDINGS: The heart size and mediastinal contours are within normal limits. Both lungs are clear. The visualized skeletal structures are unremarkable.  IMPRESSION: No active cardiopulmonary disease. Electronically Signed   By: Burman Nieves M.D.   On: 08/20/2023 21:09    Procedures Procedures  {Document cardiac monitor, telemetry assessment procedure when appropriate:1}  Medications Ordered in ED Medications - No data to display  ED Course/ Medical Decision Making/ A&P   {   Click here for ABCD2, HEART and other calculatorsREFRESH Note before signing :1}                              Medical Decision Making Patient with HTN that is worsening over  time, her PMD changes her BP med recently but patient has yet to start the new preparation.    Amount and/or Complexity of Data Reviewed Labs: ordered. Radiology: ordered.    Final Clinical Impression(s) / ED Diagnoses Return for intractable cough, coughing up blood, fevers > 100.4 unrelieved by medication, shortness of breath, intractable vomiting, chest pain, shortness of breath, weakness, numbness, changes in speech, facial asymmetry, abdominal pain, passing out, Inability to tolerate liquids or food, cough, altered mental status or any concerns. No signs of systemic illness or infection. The patient is nontoxic-appearing on exam and vital signs are within normal limits.  I have reviewed the triage vital signs and the nursing notes. Pertinent labs & imaging results that were available during my care of the patient were reviewed by me and considered in my medical decision making (see chart for details). After history, exam, and medical workup I feel the patient has been appropriately medically screened and is safe for discharge home. Pertinent diagnoses were discussed with the patient. Patient was given return precautions.

## 2023-09-04 ENCOUNTER — Telehealth: Payer: Self-pay

## 2023-09-04 NOTE — Telephone Encounter (Signed)
Transition Care Management Unsuccessful Follow-up Telephone Call  Date of discharge and from where:  08/21/2023 [facility]  Attempts:  2nd Attempt  Reason for unsuccessful TCM follow-up call:  Left voice message  Donnita Farina [facility]  [facility], [facility] Resource Care Guide Direct Dial: [phone]  Website: [email or web address]

## 2023-09-04 NOTE — Telephone Encounter (Signed)
Transition Care Management Unsuccessful Follow-up Telephone Call  Date of discharge and from where:  08/21/2023 [facility]  Attempts:  1st Attempt  Reason for unsuccessful TCM follow-up call:  Left voice message  [name] [facility]  [facility], [facility] Resource Care Guide Direct Dial: [phone]  Website: [email or web address]

## 2023-09-10 DIAGNOSIS — F331 Major depressive disorder, recurrent, moderate: Secondary | ICD-10-CM | POA: Diagnosis not present

## 2023-09-10 DIAGNOSIS — Z630 Problems in relationship with spouse or partner: Secondary | ICD-10-CM | POA: Diagnosis not present

## 2023-09-10 DIAGNOSIS — F411 Generalized anxiety disorder: Secondary | ICD-10-CM | POA: Diagnosis not present

## 2023-09-24 DIAGNOSIS — Z630 Problems in relationship with spouse or partner: Secondary | ICD-10-CM | POA: Diagnosis not present

## 2023-09-24 DIAGNOSIS — F331 Major depressive disorder, recurrent, moderate: Secondary | ICD-10-CM | POA: Diagnosis not present

## 2023-09-24 DIAGNOSIS — F411 Generalized anxiety disorder: Secondary | ICD-10-CM | POA: Diagnosis not present

## 2023-09-30 ENCOUNTER — Ambulatory Visit (INDEPENDENT_AMBULATORY_CARE_PROVIDER_SITE_OTHER): Payer: BC Managed Care – PPO | Admitting: Internal Medicine

## 2023-09-30 ENCOUNTER — Encounter: Payer: Self-pay | Admitting: Internal Medicine

## 2023-09-30 VITALS — BP 128/82 | HR 103 | Temp 98.2°F | Ht 65.0 in | Wt 258.0 lb

## 2023-09-30 DIAGNOSIS — J4540 Moderate persistent asthma, uncomplicated: Secondary | ICD-10-CM | POA: Diagnosis not present

## 2023-09-30 DIAGNOSIS — I872 Venous insufficiency (chronic) (peripheral): Secondary | ICD-10-CM

## 2023-09-30 DIAGNOSIS — E7800 Pure hypercholesterolemia, unspecified: Secondary | ICD-10-CM | POA: Diagnosis not present

## 2023-09-30 DIAGNOSIS — E538 Deficiency of other specified B group vitamins: Secondary | ICD-10-CM

## 2023-09-30 DIAGNOSIS — F419 Anxiety disorder, unspecified: Secondary | ICD-10-CM | POA: Diagnosis not present

## 2023-09-30 DIAGNOSIS — I10 Essential (primary) hypertension: Secondary | ICD-10-CM

## 2023-09-30 DIAGNOSIS — R739 Hyperglycemia, unspecified: Secondary | ICD-10-CM

## 2023-09-30 DIAGNOSIS — E559 Vitamin D deficiency, unspecified: Secondary | ICD-10-CM

## 2023-09-30 MED ORDER — AMLODIPINE BESYLATE 2.5 MG PO TABS: 7.5000 mg | ORAL_TABLET | Freq: Every day | ORAL | 3 refills | Status: DC

## 2023-09-30 NOTE — Patient Instructions (Addendum)
Please continue all other medications as before, including the amlodipine at the 7.5 mg  Please have the pharmacy call with any other refills you may need.  Please continue your efforts at being more active, low cholesterol diet, and weight control.  Please keep your appointments with your specialists as you may have planned  Please make an Appointment to return in 6 months, or sooner if needed, also with Lab Appointment for testing done 3-5 days before at the FIRST FLOOR Lab (so this is for TWO appointments - please see the scheduling desk as you leave)

## 2023-09-30 NOTE — Progress Notes (Unsigned)
Patient ID: Jean Woods, female   DOB: September 13, 1986, 37 y.o.   MRN: [id number]        Chief Complaint: follow up htn, venous insufficiency, hld, asthma, anxiety       HPI:  Jean Woods is a 37 y.o. female here overall doing ok, Did have elevated Bp at home but improved after anxiety less, so now taking 7.5 mg amlodipnie and tolerating.  Working on wt loss with less calories, more activity.  Plans to be more active soon.  Has been attending therapy, doing ok overall, only a fw Pt denies chest pain, increased sob or doe, wheezing, orthopnea, PND, palpitations, dizziness or syncope, except for very mild leg swelling with long car rides and resolves with leg elevation.   Pt denies polydipsia, polyuria, or new focal neuro s/s.    Pt denies fever, wt loss, night sweats, loss of appetite, or other constitutional symptoms   Wt Readings from Last 3 Encounters:  09/30/23 258 lb (117 kg)  08/14/23 265 lb 8 oz (120.4 kg)  05/14/23 263 lb 8 oz (119.5 kg)   BP Readings from Last 3 Encounters:  09/30/23 128/82  08/20/23 132/72  08/14/23 (!) 156/86         Past Medical History:  Diagnosis Date   Asthma    Chicken pox    Depression    Seasonal allergies    History reviewed. No pertinent surgical history.  reports that she has never smoked. She has never used smokeless tobacco. She reports current alcohol use of about 5.0 standard drinks of alcohol per week. She reports that she does not use drugs. family history includes Breast cancer in her mother; Colon cancer in her maternal grandfather; Stroke in her maternal grandmother; Thyroid disease in her maternal grandmother and mother. Allergies  Allergen Reactions   Wound Dressing Adhesive Hives    Develops rash and burning sensation where adhesive tape it placed.    Current Outpatient Medications on File Prior to Visit  Medication Sig Dispense Refill   albuterol (PROAIR HFA) 108 (90 Base) MCG/ACT inhaler Inhale 2 puffs into the lungs every 6  (six) hours as needed for wheezing or shortness of breath.     beclomethasone (QVAR) 80 MCG/ACT inhaler Inhale 1 puff into the lungs daily.     cholecalciferol (VITAMIN D) 25 MCG (1000 UNIT) tablet Take 5,000 Units by mouth at bedtime.     famotidine (PEPCID) 20 MG tablet Take 1 tablet (20 mg total) by mouth 2 (two) times daily. (Patient taking differently: Take 20 mg by mouth every evening.) 30 tablet 0   ibuprofen (ADVIL) 200 MG tablet Take 200 mg by mouth every 6 (six) hours as needed for headache.     OLOPATADINE HCL NA Place 2 sprays into both nostrils at bedtime.     Siponimod Fumarate (MAYZENT) 2 MG TABS Take 2 mg by mouth daily. 30 tablet 11   No current facility-administered medications on file prior to visit.        ROS:  All others reviewed and negative.  Objective        PE:  BP 128/82 (BP Location: Right Arm, Patient Position: Sitting, Cuff Size: Normal)   Pulse (!) 103   Temp 98.2 F (36.8 C) (Oral)   Ht 5\' 5"  (1.651 m)   Wt 258 lb (117 kg)   LMP 09/15/2023 (Approximate)   SpO2 99%   BMI 42.93 kg/m  Constitutional: Pt appears in NAD               HENT: Head: NCAT.                Right Ear: External ear normal.                 Left Ear: External ear normal.                Eyes: . Pupils are equal, round, and reactive to light. Conjunctivae and EOM are normal               Nose: without d/c or deformity               Neck: Neck supple. Gross normal ROM               Cardiovascular: Normal rate and regular rhythm.                 Pulmonary/Chest: Effort normal and breath sounds without rales or wheezing.                Abd:  Soft, NT, ND, + BS, no organomegaly               Neurological: Pt is alert. At baseline orientation, motor grossly intact               Skin: Skin is warm. No rashes, no other new lesions, LE edema - none               Psychiatric: Pt behavior is normal without agitation   Micro: none  Cardiac tracings I have personally  interpreted today:  none  Pertinent Radiological findings (summarize): none   Lab Results  Component Value Date   WBC 9.1 08/20/2023   HGB 14.0 08/20/2023   HCT 42.3 08/20/2023   PLT 377 08/20/2023   GLUCOSE 96 08/20/2023   CHOL 277 (H) 03/31/2023   TRIG 119.0 03/31/2023   HDL 69.20 03/31/2023   LDLDIRECT 164.5 12/11/2012   LDLCALC 184 (H) 03/31/2023   ALT 56 (H) 08/14/2023   AST 39 08/14/2023   NA 137 08/20/2023   K 3.8 08/20/2023   CL 103 08/20/2023   CREATININE 0.83 08/20/2023   BUN 12 08/20/2023   CO2 20 (L) 08/20/2023   TSH 2.97 03/31/2023   HGBA1C 5.1 03/31/2023   Assessment/Plan:  Jean Woods is a 37 y.o. White or Caucasian [1] female with  has a past medical history of Asthma, Chicken pox, Depression, and Seasonal allergies.  HTN (hypertension) BP Readings from Last 3 Encounters:  09/30/23 128/82  08/20/23 132/72  08/14/23 (!) 156/86   Stable, pt to continue medical treatment norvasc 7.5 qd   Venous insufficiency Very mild, for low salt, wt loss, leg elevation, compression stockings if worsening  Hypercholesterolemia Lab Results  Component Value Date   LDLCALC 184 (H) 03/31/2023   Severe uncontrolled, declines statin or other for now, for lower chol diet  Asthma Stable overall, for contd inhaler prn  Anxiety Stable overall, cont xanax prn  Followup: Return if symptoms worsen or fail to improve.  Oliver Barre, MD 10/02/2023 9:25 PM [facility]

## 2023-10-02 ENCOUNTER — Encounter: Payer: Self-pay | Admitting: Internal Medicine

## 2023-10-02 DIAGNOSIS — I872 Venous insufficiency (chronic) (peripheral): Secondary | ICD-10-CM | POA: Insufficient documentation

## 2023-10-02 NOTE — Assessment & Plan Note (Signed)
Very mild, for low salt, wt loss, leg elevation, compression stockings if worsening

## 2023-10-02 NOTE — Assessment & Plan Note (Signed)
Lab Results  Component Value Date   LDLCALC 184 (H) 03/31/2023   Severe uncontrolled, declines statin or other for now, for lower chol diet

## 2023-10-02 NOTE — Assessment & Plan Note (Signed)
BP Readings from Last 3 Encounters:  09/30/23 128/82  08/20/23 132/72  08/14/23 (!) 156/86   Stable, pt to continue medical treatment norvasc 7.5 qd

## 2023-10-02 NOTE — Assessment & Plan Note (Signed)
Stable overall, for contd inhaler prn

## 2023-10-02 NOTE — Assessment & Plan Note (Signed)
Stable overall, cont xanax prn

## 2023-10-09 DIAGNOSIS — Z630 Problems in relationship with spouse or partner: Secondary | ICD-10-CM | POA: Diagnosis not present

## 2023-10-09 DIAGNOSIS — F411 Generalized anxiety disorder: Secondary | ICD-10-CM | POA: Diagnosis not present

## 2023-10-09 DIAGNOSIS — F331 Major depressive disorder, recurrent, moderate: Secondary | ICD-10-CM | POA: Diagnosis not present

## 2023-10-21 DIAGNOSIS — F331 Major depressive disorder, recurrent, moderate: Secondary | ICD-10-CM | POA: Diagnosis not present

## 2023-10-21 DIAGNOSIS — F411 Generalized anxiety disorder: Secondary | ICD-10-CM | POA: Diagnosis not present

## 2023-10-21 DIAGNOSIS — Z630 Problems in relationship with spouse or partner: Secondary | ICD-10-CM | POA: Diagnosis not present

## 2023-10-22 ENCOUNTER — Encounter: Payer: Self-pay | Admitting: *Deleted

## 2023-10-23 ENCOUNTER — Telehealth: Payer: Self-pay | Admitting: *Deleted

## 2023-10-23 ENCOUNTER — Other Ambulatory Visit: Payer: Self-pay | Admitting: *Deleted

## 2023-10-23 DIAGNOSIS — G35 Multiple sclerosis: Secondary | ICD-10-CM

## 2023-10-23 MED ORDER — MAYZENT 2 MG PO TABS: 2.0000 mg | ORAL_TABLET | Freq: Every day | ORAL | 4 refills | Status: DC

## 2023-10-23 NOTE — Telephone Encounter (Signed)
Epic states PA needed for [employer]

## 2023-10-24 ENCOUNTER — Other Ambulatory Visit (HOSPITAL_COMMUNITY): Payer: Self-pay

## 2023-10-24 ENCOUNTER — Telehealth: Payer: Self-pay

## 2023-10-24 NOTE — Telephone Encounter (Signed)
   Will need updated insurance pharmacy benefits please-Nothing in pulling up on my eligibility tracker and insurance card in chart was scanned in 2023. Member ID, RX BIN, RX PCN, RX Group.Thanks.

## 2023-10-27 ENCOUNTER — Other Ambulatory Visit (HOSPITAL_COMMUNITY): Payer: Self-pay

## 2023-10-27 NOTE — Telephone Encounter (Signed)
Updated insurance required. Ran test claim with insurance, came back coverage terminated. No information populated from eligibility check. Please advise.

## 2023-10-27 NOTE — Telephone Encounter (Signed)
Sent mychart message to pt asking she upload current insurance to [employer]. Waiting on response.

## 2023-10-30 NOTE — Telephone Encounter (Signed)
Received a call requesting updated insurance information from [employer] - call back [phone]. Reached out to patient to get updated information but had to leave a vm

## 2023-10-31 NOTE — Telephone Encounter (Signed)
[facility] ([place]) Cancelling patient referral for [facility] because has no insurance  due to insurance has lapsed as of now. Will be able to send out her prescription when patient get her new [employer] insurance. Can call back [phone]

## 2023-11-11 ENCOUNTER — Telehealth: Payer: Self-pay

## 2023-11-11 DIAGNOSIS — F331 Major depressive disorder, recurrent, moderate: Secondary | ICD-10-CM | POA: Diagnosis not present

## 2023-11-11 DIAGNOSIS — F411 Generalized anxiety disorder: Secondary | ICD-10-CM | POA: Diagnosis not present

## 2023-11-11 DIAGNOSIS — Z630 Problems in relationship with spouse or partner: Secondary | ICD-10-CM | POA: Diagnosis not present

## 2023-11-11 NOTE — Telephone Encounter (Signed)
Faxed Patient Application to [employer] [phone] on 11/11/2023.

## 2023-11-24 ENCOUNTER — Telehealth: Payer: Self-pay

## 2023-11-24 NOTE — Telephone Encounter (Signed)
Please See MyChart message from today 11/24/2023

## 2023-12-09 ENCOUNTER — Other Ambulatory Visit: Payer: Self-pay | Admitting: *Deleted

## 2023-12-09 DIAGNOSIS — G35 Multiple sclerosis: Secondary | ICD-10-CM

## 2023-12-09 MED ORDER — MAYZENT 2 MG PO TABS: 2.0000 mg | ORAL_TABLET | Freq: Every day | ORAL | 11 refills | Status: AC

## 2023-12-09 MED ORDER — MAYZENT 2 MG PO TABS
2.0000 mg | ORAL_TABLET | Freq: Every day | ORAL | 11 refills | Status: DC
Start: 2023-12-09 — End: 2023-12-09

## 2023-12-09 NOTE — Telephone Encounter (Signed)
Prescription signed for [facility] , faxed to [employer] [facility]. [phone] with fax confirmaiton received.

## 2023-12-09 NOTE — Telephone Encounter (Signed)
Printed out new prescription to fax to [employer] pa[facility] to be signed. Fax [phone]

## 2024-03-23 ENCOUNTER — Other Ambulatory Visit (INDEPENDENT_AMBULATORY_CARE_PROVIDER_SITE_OTHER)

## 2024-03-23 ENCOUNTER — Other Ambulatory Visit: Payer: BC Managed Care – PPO

## 2024-03-23 DIAGNOSIS — E538 Deficiency of other specified B group vitamins: Secondary | ICD-10-CM | POA: Diagnosis not present

## 2024-03-23 DIAGNOSIS — E559 Vitamin D deficiency, unspecified: Secondary | ICD-10-CM | POA: Diagnosis not present

## 2024-03-23 DIAGNOSIS — E7800 Pure hypercholesterolemia, unspecified: Secondary | ICD-10-CM

## 2024-03-23 DIAGNOSIS — R739 Hyperglycemia, unspecified: Secondary | ICD-10-CM | POA: Diagnosis not present

## 2024-03-23 LAB — CBC WITH DIFFERENTIAL/PLATELET
Basophils Absolute: 0.1 10*3/uL (ref 0.0–0.1)
Basophils Relative: 0.7 % (ref 0.0–3.0)
Eosinophils Absolute: 0.1 10*3/uL (ref 0.0–0.7)
Eosinophils Relative: 1.1 % (ref 0.0–5.0)
HCT: 41.7 % (ref 36.0–46.0)
Hemoglobin: 14 g/dL (ref 12.0–15.0)
Lymphocytes Relative: 5.1 % — ABNORMAL LOW (ref 12.0–46.0)
Lymphs Abs: 0.4 10*3/uL — ABNORMAL LOW (ref 0.7–4.0)
MCHC: 33.5 g/dL (ref 30.0–36.0)
MCV: 88.4 fl (ref 78.0–100.0)
Monocytes Absolute: 0.7 10*3/uL (ref 0.1–1.0)
Monocytes Relative: 9.2 % (ref 3.0–12.0)
Neutro Abs: 6.7 10*3/uL (ref 1.4–7.7)
Neutrophils Relative %: 83.9 % — ABNORMAL HIGH (ref 43.0–77.0)
Platelets: 369 10*3/uL (ref 150.0–400.0)
RBC: 4.72 Mil/uL (ref 3.87–5.11)
RDW: 15.1 % (ref 11.5–15.5)
WBC: 8 10*3/uL (ref 4.0–10.5)

## 2024-03-23 LAB — LIPID PANEL
Cholesterol: 280 mg/dL — ABNORMAL HIGH (ref 0–200)
HDL: 73.7 mg/dL (ref >39.00)
LDL Cholesterol: 188 mg/dL — ABNORMAL HIGH (ref 0–99)
NonHDL: 206.78
Total CHOL/HDL Ratio: 4
Triglycerides: 95 mg/dL (ref 0.0–149.0)
VLDL: 19 mg/dL (ref 0.0–40.0)

## 2024-03-23 LAB — HEPATIC FUNCTION PANEL
ALT: 56 U/L — ABNORMAL HIGH (ref 0–35)
AST: 29 U/L (ref 0–37)
Albumin: 4.5 g/dL (ref 3.5–5.2)
Alkaline Phosphatase: 128 U/L — ABNORMAL HIGH (ref 39–117)
Bilirubin, Direct: 0.1 mg/dL (ref 0.0–0.3)
Total Bilirubin: 0.6 mg/dL (ref 0.2–1.2)
Total Protein: 7.5 g/dL (ref 6.0–8.3)

## 2024-03-23 LAB — BASIC METABOLIC PANEL WITH GFR
BUN: 13 mg/dL (ref 6–23)
CO2: 27 meq/L (ref 19–32)
Calcium: 9.5 mg/dL (ref 8.4–10.5)
Chloride: 102 meq/L (ref 96–112)
Creatinine, Ser: 0.82 mg/dL (ref 0.40–1.20)
GFR: 89.66 mL/min (ref >60.00)
Glucose, Bld: 94 mg/dL (ref 70–99)
Potassium: 4.4 meq/L (ref 3.5–5.1)
Sodium: 137 meq/L (ref 135–145)

## 2024-03-23 LAB — HEMOGLOBIN A1C: Hgb A1c MFr Bld: 5.2 % (ref 4.6–6.5)

## 2024-03-23 LAB — TSH: TSH: 2.42 u[IU]/mL (ref 0.35–5.50)

## 2024-03-23 LAB — VITAMIN D 25 HYDROXY (VIT D DEFICIENCY, FRACTURES): VITD: 71.51 ng/mL (ref 30.00–100.00)

## 2024-03-23 LAB — VITAMIN B12: Vitamin B-12: 629 pg/mL (ref 211–911)

## 2024-03-24 LAB — URINALYSIS, ROUTINE W REFLEX MICROSCOPIC
Bilirubin Urine: NEGATIVE
Hgb urine dipstick: NEGATIVE
Ketones, ur: NEGATIVE
Leukocytes,Ua: NEGATIVE
Nitrite: NEGATIVE
RBC / HPF: NONE SEEN (ref 0–?)
Specific Gravity, Urine: 1.015 (ref 1.000–1.030)
Total Protein, Urine: NEGATIVE
Urine Glucose: NEGATIVE
Urobilinogen, UA: 0.2 (ref 0.0–1.0)
pH: 6 (ref 5.0–8.0)

## 2024-03-30 ENCOUNTER — Encounter: Payer: Self-pay | Admitting: Internal Medicine

## 2024-03-30 ENCOUNTER — Ambulatory Visit (INDEPENDENT_AMBULATORY_CARE_PROVIDER_SITE_OTHER): Payer: Self-pay | Admitting: Internal Medicine

## 2024-03-30 VITALS — BP 128/72 | HR 113 | Temp 98.9°F | Ht 65.0 in | Wt 256.0 lb

## 2024-03-30 DIAGNOSIS — E7800 Pure hypercholesterolemia, unspecified: Secondary | ICD-10-CM | POA: Diagnosis not present

## 2024-03-30 DIAGNOSIS — Z0001 Encounter for general adult medical examination with abnormal findings: Secondary | ICD-10-CM

## 2024-03-30 DIAGNOSIS — E538 Deficiency of other specified B group vitamins: Secondary | ICD-10-CM

## 2024-03-30 DIAGNOSIS — J4540 Moderate persistent asthma, uncomplicated: Secondary | ICD-10-CM

## 2024-03-30 DIAGNOSIS — E559 Vitamin D deficiency, unspecified: Secondary | ICD-10-CM

## 2024-03-30 DIAGNOSIS — F419 Anxiety disorder, unspecified: Secondary | ICD-10-CM

## 2024-03-30 DIAGNOSIS — R739 Hyperglycemia, unspecified: Secondary | ICD-10-CM | POA: Diagnosis not present

## 2024-03-30 DIAGNOSIS — I10 Essential (primary) hypertension: Secondary | ICD-10-CM

## 2024-03-30 MED ORDER — AMLODIPINE BESYLATE 2.5 MG PO TABS: 7.5000 mg | ORAL_TABLET | Freq: Every day | ORAL | 3 refills | Status: DC

## 2024-03-30 MED ORDER — HYDROCHLOROTHIAZIDE 12.5 MG PO CAPS: 12.5000 mg | ORAL_CAPSULE | Freq: Every day | ORAL | 3 refills | Status: AC

## 2024-03-30 MED ORDER — AMLODIPINE BESYLATE 5 MG PO TABS: 5.0000 mg | ORAL_TABLET | Freq: Every day | ORAL | 3 refills | Status: AC

## 2024-03-30 NOTE — Assessment & Plan Note (Signed)
 BP Readings from Last 3 Encounters:  03/30/24 128/72  09/30/23 128/82  08/20/23 132/72   With trace edema - for decreased amlodipine to 5 mg every day, and add hct 12.5 mg qd

## 2024-03-30 NOTE — Assessment & Plan Note (Signed)
 Lab Results  Component Value Date   LDLCALC 188 (H) 03/23/2024   Severe uncontrolled, suspect genetic, declines statin, for cardiac CT score

## 2024-03-30 NOTE — Assessment & Plan Note (Signed)
 Stale, decliens need for counseling

## 2024-03-30 NOTE — Assessment & Plan Note (Signed)
 Age and sex appropriate education and counseling updated with regular exercise and diet Referrals for preventative services - none needed Immunizations addressed - declines prevnar Smoking counseling  - none needed Evidence for depression or other mood disorder - none significant Most recent labs reviewed. I have personally reviewed and have noted: 1) the patient's medical and social history 2) The patient's current medications and supplements 3) The patient's height, weight, and BMI have been recorded in the chart

## 2024-03-30 NOTE — Addendum Note (Signed)
 Addended by: Corwin Levins on: 03/30/2024 09:09 PM   Modules accepted: Orders

## 2024-03-30 NOTE — Assessment & Plan Note (Signed)
 Stable , cont inhaler prn

## 2024-03-30 NOTE — Progress Notes (Signed)
 Patient ID: Jean Woods, female   DOB: 12-30-1985, 38 y.o.   MRN: [id number]         Chief Complaint:: wellness exam and hld, anxiety, htn, edema       HPI:  Jean Woods is a 37 y.o. female here for wellness exam; decliens prevnar, o/w up to date                        Also Pt denies chest pain, increased sob or doe, wheezing, orthopnea, PND, palpitations, dizziness or syncope but does have some diffuse swelling to arms and legs with increased amlodipine 7.5 mg.   Pt denies polydipsia, polyuria, or new focal neuro s/s.    Pt denies fever, wt loss, night sweats, loss of appetite, or other constitutional symptoms   Has long hx of severe hld   Denies worsening depressive symptoms, suicidal ideation, or panic; has ongoing anxiety,    Wt Readings from Last 3 Encounters:  03/30/24 256 lb (116.1 kg)  09/30/23 258 lb (117 kg)  08/14/23 265 lb 8 oz (120.4 kg)   BP Readings from Last 3 Encounters:  03/30/24 128/72  09/30/23 128/82  08/20/23 132/72   Immunization History  Administered Date(s) Administered   Moderna Sars-Covid-2 Vaccination 03/16/2020, 04/10/2020, 10/27/2020   Tdap 12/11/2012, 12/25/2018   Health Maintenance Due  Topic Date Due   Pneumococcal Vaccine 71-15 Years old (1 of 2 - PCV) Never done      Past Medical History:  Diagnosis Date   Asthma    Chicken pox    Depression    Seasonal allergies    History reviewed. No pertinent surgical history.  reports that she has never smoked. She has never used smokeless tobacco. She reports current alcohol use of about 5.0 standard drinks of alcohol per week. She reports that she does not use drugs. family history includes Breast cancer in her mother; Colon cancer in her maternal grandfather; Stroke in her maternal grandmother; Thyroid disease in her maternal grandmother and mother. Allergies  Allergen Reactions   Wound Dressing Adhesive Hives    Develops rash and burning sensation where adhesive tape it placed.     Current Outpatient Medications on File Prior to Visit  Medication Sig Dispense Refill   albuterol (PROAIR HFA) 108 (90 Base) MCG/ACT inhaler Inhale 2 puffs into the lungs every 6 (six) hours as needed for wheezing or shortness of breath.     cholecalciferol (VITAMIN D) 25 MCG (1000 UNIT) tablet Take 5,000 Units by mouth at bedtime.     famotidine (PEPCID) 20 MG tablet Take 1 tablet (20 mg total) by mouth 2 (two) times daily. (Patient taking differently: Take 20 mg by mouth every evening.) 30 tablet 0   ibuprofen (ADVIL) 200 MG tablet Take 200 mg by mouth every 6 (six) hours as needed for headache.     MAYZENT 2 MG TABS Take 1 tablet (2 mg total) by mouth daily. 30 tablet 11   OLOPATADINE HCL NA Place 2 sprays into both nostrils at bedtime.     beclomethasone (QVAR) 80 MCG/ACT inhaler Inhale 1 puff into the lungs daily. (Patient not taking: Reported on 03/30/2024)     No current facility-administered medications on file prior to visit.        ROS:  All others reviewed and negative.  Objective        PE:  BP 128/72 (BP Location: Right Arm, Patient Position: Sitting, Cuff Size: Normal)   Pulse Marland Kitchen)  113   Temp 98.9 F (37.2 C) (Oral)   Ht 5\' 5"  (1.651 m)   Wt 256 lb (116.1 kg)   LMP 03/03/2024 (Approximate)   SpO2 98%   BMI 42.60 kg/m                 Constitutional: Pt appears in NAD               HENT: Head: NCAT.                Right Ear: External ear normal.                 Left Ear: External ear normal.                Eyes: . Pupils are equal, round, and reactive to light. Conjunctivae and EOM are normal               Nose: without d/c or deformity               Neck: Neck supple. Gross normal ROM               Cardiovascular: Normal rate and regular rhythm.                 Pulmonary/Chest: Effort normal and breath sounds without rales or wheezing.                Abd:  Soft, NT, ND, + BS, no organomegaly               Neurological: Pt is alert. At baseline orientation, motor  grossly intact               Skin: Skin is warm. No rashes, no other new lesions, LE edema - trace edema bilateral               Psychiatric: Pt behavior is normal without agitation   Micro: none  Cardiac tracings I have personally interpreted today:  none  Pertinent Radiological findings (summarize): none   Lab Results  Component Value Date   WBC 8.0 03/23/2024   HGB 14.0 03/23/2024   HCT 41.7 03/23/2024   PLT 369.0 03/23/2024   GLUCOSE 94 03/23/2024   CHOL 280 (H) 03/23/2024   TRIG 95.0 03/23/2024   HDL 73.70 03/23/2024   LDLDIRECT 164.5 12/11/2012   LDLCALC 188 (H) 03/23/2024   ALT 56 (H) 03/23/2024   AST 29 03/23/2024   NA 137 03/23/2024   K 4.4 03/23/2024   CL 102 03/23/2024   CREATININE 0.82 03/23/2024   BUN 13 03/23/2024   CO2 27 03/23/2024   TSH 2.42 03/23/2024   HGBA1C 5.2 03/23/2024   Assessment/Plan:  Jean Woods is a 38 y.o. White or Caucasian [1] female with  has a past medical history of Asthma, Chicken pox, Depression, and Seasonal allergies.  Encounter for well adult exam with abnormal findings Age and sex appropriate education and counseling updated with regular exercise and diet Referrals for preventative services - none needed Immunizations addressed - declines prevnar Smoking counseling  - none needed Evidence for depression or other mood disorder - none significant Most recent labs reviewed. I have personally reviewed and have noted: 1) the patient's medical and social history 2) The patient's current medications and supplements 3) The patient's height, weight, and BMI have been recorded in the chart   Hypercholesterolemia Lab Results  Component Value Date   LDLCALC 188 (H) 03/23/2024   Severe uncontrolled,  suspect genetic, declines statin, for cardiac CT score   Asthma Stable, cont inhaler prn  Anxiety Stale, decliens need for counseling  HTN (hypertension) BP Readings from Last 3 Encounters:  03/30/24 128/72  09/30/23  128/82  08/20/23 132/72   With trace edema - for decreased amlodipine to 5 mg every day, and add hct 12.5 mg qd  Followup: Return in about 1 year (around 03/30/2025).  Oliver Barre, MD 03/30/2024 9:07 PM [facility] - [facility]

## 2024-03-30 NOTE — Patient Instructions (Signed)
 Ok to reduce the amlodipine to 5 mg per day  Please take all new medication as prescribed - the HCT 12.5 mg per day (fluid pill that also helps BP)  Please continue all other medications as before, and refills have been done if requested.  Please have the pharmacy call with any other refills you may need.  Please continue your efforts at being more active, low cholesterol diet, and weight control.  You are otherwise up to date with prevention measures today.  Please keep your appointments with your specialists as you may have planned  You will be contacted regarding the referral for: Cardiac Ct score  Please make an Appointment to return for your 1 year visit, or sooner if needed, with Lab testing by Appointment as well, to be done about 3-5 days before at the FIRST FLOOR Lab (so this is for TWO appointments - please see the scheduling desk as you leave)

## 2024-05-11 DIAGNOSIS — Z630 Problems in relationship with spouse or partner: Secondary | ICD-10-CM | POA: Diagnosis not present

## 2024-05-11 DIAGNOSIS — F331 Major depressive disorder, recurrent, moderate: Secondary | ICD-10-CM | POA: Diagnosis not present

## 2024-05-11 DIAGNOSIS — F411 Generalized anxiety disorder: Secondary | ICD-10-CM | POA: Diagnosis not present

## 2024-05-19 DIAGNOSIS — Z1231 Encounter for screening mammogram for malignant neoplasm of breast: Secondary | ICD-10-CM | POA: Diagnosis not present

## 2024-05-19 DIAGNOSIS — Z130 Encounter for screening for diseases of the blood and blood-forming organs and certain disorders involving the immune mechanism: Secondary | ICD-10-CM | POA: Diagnosis not present

## 2024-05-19 DIAGNOSIS — Z1371 Encounter for nonprocreative screening for genetic disease carrier status: Secondary | ICD-10-CM | POA: Diagnosis not present

## 2024-05-19 DIAGNOSIS — Z01419 Encounter for gynecological examination (general) (routine) without abnormal findings: Secondary | ICD-10-CM | POA: Diagnosis not present

## 2024-06-08 DIAGNOSIS — F411 Generalized anxiety disorder: Secondary | ICD-10-CM | POA: Diagnosis not present

## 2024-06-08 DIAGNOSIS — F331 Major depressive disorder, recurrent, moderate: Secondary | ICD-10-CM | POA: Diagnosis not present

## 2024-06-08 DIAGNOSIS — Z630 Problems in relationship with spouse or partner: Secondary | ICD-10-CM | POA: Diagnosis not present

## 2024-06-22 DIAGNOSIS — F331 Major depressive disorder, recurrent, moderate: Secondary | ICD-10-CM | POA: Diagnosis not present

## 2024-06-22 DIAGNOSIS — F411 Generalized anxiety disorder: Secondary | ICD-10-CM | POA: Diagnosis not present

## 2024-06-22 DIAGNOSIS — Z630 Problems in relationship with spouse or partner: Secondary | ICD-10-CM | POA: Diagnosis not present

## 2024-07-07 DIAGNOSIS — F411 Generalized anxiety disorder: Secondary | ICD-10-CM | POA: Diagnosis not present

## 2024-07-07 DIAGNOSIS — Z630 Problems in relationship with spouse or partner: Secondary | ICD-10-CM | POA: Diagnosis not present

## 2024-07-07 DIAGNOSIS — F331 Major depressive disorder, recurrent, moderate: Secondary | ICD-10-CM | POA: Diagnosis not present

## 2024-08-03 DIAGNOSIS — F411 Generalized anxiety disorder: Secondary | ICD-10-CM | POA: Diagnosis not present

## 2024-08-03 DIAGNOSIS — F331 Major depressive disorder, recurrent, moderate: Secondary | ICD-10-CM | POA: Diagnosis not present

## 2024-08-03 DIAGNOSIS — Z630 Problems in relationship with spouse or partner: Secondary | ICD-10-CM | POA: Diagnosis not present

## 2024-08-24 DIAGNOSIS — Z630 Problems in relationship with spouse or partner: Secondary | ICD-10-CM | POA: Diagnosis not present

## 2024-08-24 DIAGNOSIS — F411 Generalized anxiety disorder: Secondary | ICD-10-CM | POA: Diagnosis not present

## 2024-08-24 DIAGNOSIS — F331 Major depressive disorder, recurrent, moderate: Secondary | ICD-10-CM | POA: Diagnosis not present

## 2024-09-07 DIAGNOSIS — Z630 Problems in relationship with spouse or partner: Secondary | ICD-10-CM | POA: Diagnosis not present

## 2024-09-07 DIAGNOSIS — F411 Generalized anxiety disorder: Secondary | ICD-10-CM | POA: Diagnosis not present

## 2024-09-07 DIAGNOSIS — F331 Major depressive disorder, recurrent, moderate: Secondary | ICD-10-CM | POA: Diagnosis not present

## 2024-09-28 DIAGNOSIS — Z630 Problems in relationship with spouse or partner: Secondary | ICD-10-CM | POA: Diagnosis not present

## 2024-09-28 DIAGNOSIS — F411 Generalized anxiety disorder: Secondary | ICD-10-CM | POA: Diagnosis not present

## 2024-09-28 DIAGNOSIS — F331 Major depressive disorder, recurrent, moderate: Secondary | ICD-10-CM | POA: Diagnosis not present

## 2024-10-26 DIAGNOSIS — F331 Major depressive disorder, recurrent, moderate: Secondary | ICD-10-CM | POA: Diagnosis not present

## 2024-10-26 DIAGNOSIS — F411 Generalized anxiety disorder: Secondary | ICD-10-CM | POA: Diagnosis not present

## 2024-10-26 DIAGNOSIS — Z630 Problems in relationship with spouse or partner: Secondary | ICD-10-CM | POA: Diagnosis not present

## 2024-11-09 DIAGNOSIS — F331 Major depressive disorder, recurrent, moderate: Secondary | ICD-10-CM | POA: Diagnosis not present

## 2024-11-09 DIAGNOSIS — F411 Generalized anxiety disorder: Secondary | ICD-10-CM | POA: Diagnosis not present

## 2024-11-09 DIAGNOSIS — Z630 Problems in relationship with spouse or partner: Secondary | ICD-10-CM | POA: Diagnosis not present

## 2024-11-23 DIAGNOSIS — F331 Major depressive disorder, recurrent, moderate: Secondary | ICD-10-CM | POA: Diagnosis not present

## 2024-11-23 DIAGNOSIS — Z630 Problems in relationship with spouse or partner: Secondary | ICD-10-CM | POA: Diagnosis not present

## 2024-11-23 DIAGNOSIS — F411 Generalized anxiety disorder: Secondary | ICD-10-CM | POA: Diagnosis not present

## 2024-11-30 ENCOUNTER — Telehealth: Payer: Self-pay | Admitting: Neurology

## 2024-11-30 ENCOUNTER — Encounter: Payer: Self-pay | Admitting: Neurology

## 2024-11-30 NOTE — Telephone Encounter (Signed)
 Pt called stating that she is needing a refill on her MAYZENT  2 MG TABS and that forms are needing to be filled out from [employer]. Please advise.

## 2024-12-02 NOTE — Telephone Encounter (Signed)
 Called  Pt  , No Answer Left Detailed  message  that forms were received and  is in process to get sent to  [employer]

## 2024-12-02 NOTE — Telephone Encounter (Signed)
 FAXED AND RECEIVED CONFIRMATION

## 2025-01-03 ENCOUNTER — Ambulatory Visit: Payer: Self-pay

## 2025-01-03 ENCOUNTER — Encounter: Payer: Self-pay | Admitting: Internal Medicine

## 2025-01-03 MED ORDER — ALPRAZOLAM 0.5 MG PO TABS: ORAL_TABLET | ORAL | 0 refills | Status: AC

## 2025-01-03 NOTE — Telephone Encounter (Signed)
" °  FYI Only or Action Required?: Action required by provider: request for anti anxiety meds.  Patient was last seen in primary care on 03/30/2024 by Norleen Lynwood ORN, MD.  Called Nurse Triage reporting Anxiety.  Symptoms began 2 days ago.  Interventions attempted: Nothing.  Symptoms are: gradually worsening.  Triage Disposition: See Physician Within 24 Hours  Patient/caregiver understands and will follow disposition?: No, wishes to speak with PCP   Reason for Triage: can't eat or drink due to anxiety  Reason for Disposition  Patient sounds very upset or troubled to the triager  Answer Assessment - Initial Assessment Questions Husband is in hospital and pt is very anxiety. Has appt with therapist tomorrow. Declines in office appt. Requesting medication   1. CONCERN: Did anything happen that prompted you to call today?      anxiety 2. ANXIETY SYMPTOMS: Can you describe how you (your loved one; patient) have been feeling? (e.g., tense, restless, panicky, anxious, keyed up, overwhelmed, sense of impending doom).      anxious 3. ONSET: How long have you been feeling this way? (e.g., hours, days, weeks)     2 days 4. SEVERITY: How would you rate the level of anxiety? (e.g., 0 - 10; or mild, moderate, severe).     8/10 5. FUNCTIONAL IMPAIRMENT: How have these feelings affected your ability to do daily activities? Have you had more difficulty than usual doing your normal daily activities? (e.g., getting better, same, worse; self-care, school, work, interactions)     Able to funtion barley 6. HISTORY: Have you felt this way before? Have you ever been diagnosed with an anxiety problem in the past? (e.g., generalized anxiety disorder, panic attacks, PTSD). If Yes, ask: How was this problem treated? (e.g., medicines, counseling, etc.)     no 7. RISK OF HARM - SUICIDAL IDEATION: Do you ever have thoughts of hurting or killing yourself? If Yes, ask:  Do you have these feelings  now? Do you have a plan on how you would do this?     denies 8. TREATMENT:  What has been done so far to treat this anxiety? (e.g., medicines, relaxation strategies). What has helped?     none 9. THERAPIST: Do you have a counselor or therapist? If Yes, ask: What is their name?     yes 10. POTENTIAL TRIGGERS: Do you drink caffeinated beverages (e.g., coffee, colas, teas), and how much daily? Do you drink alcohol or use any drugs? Have you started any new medicines recently?       no 11. PATIENT SUPPORT: Who is with you now? Who do you live with? Do you have family or friends who you can talk to?        Has family and friends 66. OTHER SYMPTOMS: Do you have any other symptoms? (e.g., feeling depressed, trouble concentrating, trouble sleeping, trouble breathing, palpitations or fast heartbeat, chest pain, sweating, nausea, or diarrhea)       Nausea, vomiting  Protocols used: Anxiety and Panic Attack-A-AH  "

## 2025-01-03 NOTE — Telephone Encounter (Signed)
 Attempted to contact pt to f/u on symptoms, PCP does have appts this week. NA, LM for her to return call. Attempt #1    Message from Kendralyn S sent at 01/03/2025  1:37 PM EST  Reason for Triage: can't eat or drink due to anxiety [phone]    Per my chart, Hello Dr. Norleen,   I am having bad anxiety dealing with major event in my life, my husband is hospitalized.  My mental health care provider is not able to prescribe medication. Is there a mild anti-anxiety medication to help me at this time?    Thank you, Timiko Offutt  [phone]

## 2025-01-04 ENCOUNTER — Ambulatory Visit: Admitting: Emergency Medicine

## 2025-01-05 ENCOUNTER — Other Ambulatory Visit: Payer: Self-pay | Admitting: Internal Medicine

## 2025-01-05 MED ORDER — ONDANSETRON 4 MG PO TBDP: 4.0000 mg | ORAL_TABLET | Freq: Three times a day (TID) | ORAL | 1 refills | Status: AC | PRN

## 2025-01-05 NOTE — Telephone Encounter (Signed)
 Ok xanax  was called in earlier, but I can also send zofran  for nausea.   thanks

## 2025-01-31 ENCOUNTER — Ambulatory Visit: Admitting: Neurology

## 2025-04-04 ENCOUNTER — Encounter: Admitting: Internal Medicine
